# Patient Record
Sex: Male | Born: 1935 | Race: White | Hispanic: No | Marital: Single | Smoking: Never smoker
Health system: Southern US, Community
[De-identification: ages and names within clinical notes are randomized; demographics above are authoritative.]

## PROBLEM LIST (undated history)

## (undated) DIAGNOSIS — I639 Cerebral infarction, unspecified: Secondary | ICD-10-CM

## (undated) DIAGNOSIS — F0280 Dementia in other diseases classified elsewhere without behavioral disturbance: Secondary | ICD-10-CM

## (undated) DIAGNOSIS — I10 Essential (primary) hypertension: Secondary | ICD-10-CM

## (undated) DIAGNOSIS — G309 Alzheimer's disease, unspecified: Secondary | ICD-10-CM

## (undated) DIAGNOSIS — G459 Transient cerebral ischemic attack, unspecified: Secondary | ICD-10-CM

## (undated) DIAGNOSIS — R339 Retention of urine, unspecified: Secondary | ICD-10-CM

---

## 2013-09-02 ENCOUNTER — Emergency Department (HOSPITAL_COMMUNITY)
Admission: EM | Admit: 2013-09-02 | Discharge: 2013-09-05 | Disposition: A | Discharge status: Skilled Nursing Facility | Payer: Medicare Other | Attending: Emergency Medicine | Admitting: Emergency Medicine

## 2013-09-02 ENCOUNTER — Emergency Department (HOSPITAL_COMMUNITY): Payer: Medicare Other

## 2013-09-02 ENCOUNTER — Encounter (HOSPITAL_COMMUNITY): Payer: Self-pay | Admitting: Emergency Medicine

## 2013-09-02 DIAGNOSIS — F0391 Unspecified dementia with behavioral disturbance: Secondary | ICD-10-CM | POA: Insufficient documentation

## 2013-09-02 DIAGNOSIS — F0390 Unspecified dementia without behavioral disturbance: Secondary | ICD-10-CM

## 2013-09-02 DIAGNOSIS — F03918 Unspecified dementia, unspecified severity, with other behavioral disturbance: Secondary | ICD-10-CM | POA: Insufficient documentation

## 2013-09-02 HISTORY — DX: Cerebral infarction, unspecified: I63.9

## 2013-09-02 HISTORY — DX: Essential (primary) hypertension: I10

## 2013-09-02 LAB — CBC WITH DIFFERENTIAL/PLATELET
Basophils Absolute: 0.1 10*3/uL (ref 0.0–0.1)
Basophils Relative: 1 % (ref 0–1)
Eosinophils Absolute: 0 10*3/uL (ref 0.0–0.7)
HCT: 43.2 % (ref 39.0–52.0)
Lymphocytes Relative: 17 % (ref 12–46)
MCV: 89.1 fL (ref 78.0–100.0)
Monocytes Absolute: 0.7 10*3/uL (ref 0.1–1.0)
Monocytes Relative: 7 % (ref 3–12)
Neutro Abs: 7.6 10*3/uL (ref 1.7–7.7)
Neutrophils Relative %: 76 % (ref 43–77)
RDW: 12.8 % (ref 11.5–15.5)
WBC: 10 10*3/uL (ref 4.0–10.5)

## 2013-09-02 LAB — POCT I-STAT, CHEM 8
BUN: 25 mg/dL — ABNORMAL HIGH (ref 6–23)
Calcium, Ion: 1.19 mmol/L (ref 1.13–1.30)
Chloride: 107 mEq/L (ref 96–112)
Creatinine, Ser: 1.6 mg/dL — ABNORMAL HIGH (ref 0.50–1.35)
Glucose, Bld: 123 mg/dL — ABNORMAL HIGH (ref 70–99)
Potassium: 3.8 mEq/L (ref 3.5–5.1)
TCO2: 20 mmol/L (ref 0–100)

## 2013-09-02 LAB — URINALYSIS, ROUTINE W REFLEX MICROSCOPIC
Ketones, ur: NEGATIVE mg/dL
Leukocytes, UA: NEGATIVE
Nitrite: NEGATIVE
Protein, ur: NEGATIVE mg/dL
Specific Gravity, Urine: 1.032 — ABNORMAL HIGH (ref 1.005–1.030)
Urobilinogen, UA: 1 mg/dL (ref 0.0–1.0)
pH: 5 (ref 5.0–8.0)

## 2013-09-02 LAB — RAPID URINE DRUG SCREEN, HOSP PERFORMED
Benzodiazepines: NOT DETECTED
Cocaine: NOT DETECTED
Opiates: NOT DETECTED

## 2013-09-02 LAB — ETHANOL: Alcohol, Ethyl (B): <11 mg/dL (ref 0–11)

## 2013-09-02 MED ORDER — HALOPERIDOL LACTATE 5 MG/ML IJ SOLN
5.0000 mg | Freq: Once | INTRAMUSCULAR | Status: AC
  Administered 2013-09-02: 5 mg via INTRAMUSCULAR
  Filled 2013-09-02: qty 1

## 2013-09-02 MED ORDER — HALOPERIDOL 1 MG PO TABS
0.5000 mg | ORAL_TABLET | Freq: Once | ORAL | Status: DC
  Filled 2013-09-02: qty 1
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
## 2013-09-02 NOTE — ED Notes (Signed)
[facility] called and made aware that patient will be returning to them.

## 2013-09-02 NOTE — ED Notes (Signed)
[facility] called for transport.  
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
## 2013-09-02 NOTE — ED Notes (Signed)
PT TRANSPORTED TO CT 

## 2013-09-03 DIAGNOSIS — F0390 Unspecified dementia without behavioral disturbance: Secondary | ICD-10-CM

## 2013-09-03 MED ORDER — SODIUM CHLORIDE 0.9 % IV BOLUS (SEPSIS)
1000.0000 mL | Freq: Once | INTRAVENOUS | Status: AC
  Administered 2013-09-03: 1000 mL via INTRAVENOUS

## 2013-09-03 MED ORDER — LISINOPRIL 5 MG PO TABS
5.0000 mg | ORAL_TABLET | Freq: Every day | ORAL | Status: DC
  Administered 2013-09-03 – 2013-09-05 (×3): 5 mg via ORAL
  Filled 2013-09-03 (×3): qty 1

## 2013-09-03 MED ORDER — ASPIRIN 81 MG PO CHEW
81.0000 mg | CHEWABLE_TABLET | Freq: Every day | ORAL | Status: DC
  Administered 2013-09-03 – 2013-09-05 (×3): 81 mg via ORAL
  Filled 2013-09-03 (×2): qty 1

## 2013-09-03 MED ORDER — HALOPERIDOL 1 MG PO TABS
0.5000 mg | ORAL_TABLET | ORAL | Status: DC | PRN
  Administered 2013-09-04: 0.5 mg via ORAL
  Filled 2013-09-03: qty 1
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
## 2013-09-03 NOTE — ED Notes (Signed)
Marcelino Duster from [facility] called to see if pt is IVC, told her EPIC chart says voluntary but transferred her to main ed to speak with his RN to confirm.

## 2013-09-03 NOTE — Progress Notes (Signed)
CSW spoke with Engineer, manufacturing at [facility]. Per difscussion with Crystal, patient has been agitated, throwing food and refusing to take medications. Pt was agitated upon arrival to the ED, however after im haldol administerred patient is calm and cooperative. Pt has history of dementia. Pt recently moved into [place] 3 days ago. Patient was prescribed haldol at the facility, however this medication is PO. [facility] will accept patient back, as long as medication is prescribed, such as Ativan Gel to help ease pt agitation when patient is refusing medications. CSW informed psychiatrist and EDP.   Marland KitchenCatha Gosselin, [place] [phone]  ED CSW .09/03/2013 1405pm
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
## 2013-09-03 NOTE — Progress Notes (Signed)
Patient ID: Jacob York, male   DOB: 14-Jun-1936, 77 y.o.   MRN: [id number] Attempted to evaluate patient this am with Dr Tawni Carnes but was not able to.  Patient could not participate and remain engaged in the conversation.  Patient answered "I don't know" to all questions asked.  He is oriented to self only.  He denies wanting to hurt self or anybody.  He says he love everybody he is living with but then stated he lived with his brothers.  Patient is currently living in an assisted living facility  Patient denied AVH and he did not exhibit any signs of paranoia. Plan: We will discharge patient back to his assisted living facility We will advise staff at the ALF to contact their visiting Psychiatrist who will manage his anger out burst Dahlia Byes  PMHNP-BC

## 2013-09-03 NOTE — Progress Notes (Addendum)
Underwriter initiated bed placement for gero-psych placement for pt.  The following facilities were contacted for availability: 1)[facility]-declined due to aggression and dementia 2)[facility]- faxed referral 3)[facility]-no beds 4)[facility]-no beds per [facility] answer 6)[facility]-out of encatchment area 7)[facility]-full 8)[facility]- faxed referral 9)[facility]-declined due to aggression and dementia 10)[facility]-faxed referral 11)[facility]-declined due to aggression 12)[facility]-full 13)[facility]-faxed referral 14)[facility]-faxed referral; Jake Shark @ [facility] gave ZOXW#[id number] good until 09/09/13  Blain Pais, MHT/NS

## 2013-09-03 NOTE — Progress Notes (Signed)
CSW attempted to seek geri-psych placement for pt. [facility] is at capacity. [facility] is at capacity.   Sent referral to [facility].  Pt is pending review.  Marva Panda, Theresia Majors  [phone]  .09/03/2013
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
## 2013-09-03 NOTE — ED Provider Notes (Signed)
Medical screening examination/treatment/procedure(s) were conducted as a shared visit with non-physician practitioner(s) and myself.  I personally evaluated the patient during the encounter.  EKG Interpretation    Date/Time:  Thursday September 02 2013 19:30:57 EST Ventricular Rate:  100 PR Interval:  213 QRS Duration: 79 QT Interval:  341 QTC Calculation: 440 R Axis:   1 Text Interpretation:  Sinus tachycardia Borderline prolonged PR interval Confirmed by Gwendolyn Grant  MD, Lexton Hidalgo ([id number]) on 09/02/2013 7:40:47 PM            69M sent from nursing home for aggressive behavior. Here relaxing comfortably, disoriented. Well appearing, in NAD. Labs and imaging normal. Upon taking patient back to nursing home, got extremely agitated. Haldol given for agitation. Patient had TTS consult to help with possible new medications for agitation.   Dagmar Hait, MD 09/03/13 [phone]

## 2013-09-03 NOTE — ED Provider Notes (Signed)
Jacob York S 12:00 AM patient discussed and signed. Patient awaiting psychiatric consult for medication recommendations for his agitated condition.  Patient has been resting well without any complications or events through the night. Psychiatric consultation still pending.  Angus Seller, PA-C 09/03/13 [phone]

## 2013-09-03 NOTE — ED Provider Notes (Signed)
Behavior health has attempted to send the patient back to this facility without success. Patient's elevated creatinine noted he will be given a liter of fluid and a repeat bmet has been ordered for tomorrow. Per psychiatry recommendation, patient will require inpatient hospitalization and will be started on psychiatric medications protime and his home medications have been reordered by me  Toy Baker, MD 09/03/13 1419

## 2013-09-03 NOTE — ED Notes (Signed)
Waiting on social work prior to discharge

## 2013-09-03 NOTE — BHH Counselor (Signed)
Writer called Conservator, museum/gallery at [facility] [phone]. Jacob York states they will take pt back if his meds are changed and he is no longer aggressive. TTS will contact Tori after psychiatrist or [facility] midlevel evaluate him.  Evette Cristal, [place] Assessment Counselor

## 2013-09-03 NOTE — Progress Notes (Signed)
CSW referred patient to [facility], however at capacity. Oncoming staff and tts to seek geriatric psych placement.   Catha Gosselin, LCSW [phone]  ED CSW .09/03/2013 1423pm

## 2013-09-03 NOTE — ED Provider Notes (Signed)
Medical screening examination/treatment/procedure(s) were performed by non-physician practitioner and as supervising physician I was immediately available for consultation/collaboration.  EKG Interpretation    Date/Time:  Thursday September 02 2013 19:30:57 EST Ventricular Rate:  100 PR Interval:  213 QRS Duration: 79 QT Interval:  341 QTC Calculation: 440 R Axis:   1 Text Interpretation:  Sinus tachycardia Borderline prolonged PR interval Confirmed by Gwendolyn Grant  MD, BLAIR ([id number]) on 09/02/2013 7:40:47 PM              Randa Spike Mort Sawyers, MD 09/03/13 [phone]

## 2013-09-03 NOTE — Progress Notes (Signed)
Pt was interviewed with NP. Chart reviewed. Agree with above assessment and plan. Pt is currently calm and cooperative. He denies SI/HI/AVH/paranoia. He started living in a new ALF 3 days ago. Since pt is currently calm, we will discharge pt back to ALF. He will need to f/u with the ALF's consulting psychiatrist for med recommendations for any future agitation.

## 2013-09-03 NOTE — Progress Notes (Signed)
   CARE MANAGEMENT ED NOTE 09/03/2013  Patient:  TYRION, GLAUDE   Account Number:  [id number]  Date Initiated:  09/03/2013  Documentation initiated by:  Edd Arbour  Subjective/Objective Assessment:   77 yr old male aarp medicare complete pt Pt initially unable to remember his pcp name or location of male MD office After noted word finding pt states pcp is in [place], [place] Dr Lisbeth Ply     Subjective/Objective Assessment Detail:     Action/Plan:   Cm spoke with pt updated EPIC   Action/Plan Detail:   Anticipated DC Date:       Status Recommendation to Physician:   Result of Recommendation:    Other ED Services  Consult Working Plan    DC Planning Services  Other  PCP issues  Outpatient Services - Pt will follow up    Choice offered to / List presented to:            Status of service:  Completed, signed off  ED Comments:   ED Comments Detail:

## 2013-09-03 NOTE — Progress Notes (Signed)
Gunnar Fusi, from [facility], return call stated pt was declined from due to dementia.  [facility] does not treat dementia at this time.   Blain Pais, MHT/NS

## 2013-09-03 NOTE — ED Notes (Signed)
Psychiatry at bedside.

## 2013-09-03 NOTE — Progress Notes (Signed)
Per chart review, patient from [facility]. Per chart review, patient awaiting psych evaluation by providers to determine medication recommendation and disposition. CSW awaiting further evaluation.   Catha Gosselin, LCSW [phone]  ED CSW .09/03/2013 807am

## 2013-09-04 DIAGNOSIS — F0391 Unspecified dementia with behavioral disturbance: Secondary | ICD-10-CM

## 2013-09-04 DIAGNOSIS — F03918 Unspecified dementia, unspecified severity, with other behavioral disturbance: Secondary | ICD-10-CM

## 2013-09-04 LAB — BASIC METABOLIC PANEL
BUN: 18 mg/dL (ref 6–23)
BUN: 18 mg/dL (ref 6–23)
CO2: 22 mEq/L (ref 19–32)
CO2: 24 mEq/L (ref 19–32)
Chloride: 101 mEq/L (ref 96–112)
Chloride: 100 mEq/L (ref 96–112)
Creatinine, Ser: 1.36 mg/dL — ABNORMAL HIGH (ref 0.50–1.35)
GFR calc Af Amer: 59 mL/min — ABNORMAL LOW (ref >90)
GFR calc Af Amer: 57 mL/min — ABNORMAL LOW (ref >90)
GFR calc non Af Amer: 51 mL/min — ABNORMAL LOW (ref >90)
Glucose, Bld: 106 mg/dL — ABNORMAL HIGH (ref 70–99)
Glucose, Bld: 125 mg/dL — ABNORMAL HIGH (ref 70–99)
Potassium: 3.5 mEq/L (ref 3.5–5.1)
Potassium: 4.4 mEq/L (ref 3.5–5.1)
Sodium: 137 mEq/L (ref 135–145)

## 2013-09-04 MED ORDER — DIPHENHYDRAMINE HCL 25 MG PO CAPS
50.0000 mg | ORAL_CAPSULE | Freq: Once | ORAL | Status: AC
  Administered 2013-09-04: 50 mg via ORAL
  Filled 2013-09-04: qty 2

## 2013-09-04 NOTE — BHH Counselor (Signed)
Writer spoke with Beryle Quant. @ [facility], unsure if psychiatrist will be taking any outside referrals today and this pt has not been reviewed by staff.    Follow up with Deanne @ [facility], will be reviewed with psychiatrist today for disposition.

## 2013-09-04 NOTE — BHH Counselor (Signed)
Writer contacted [facility].  [facility] confirmed pt is pending medical review for waitlist placement   [place] Anthonette Legato, MSW, LCSW, [facility] Triage Specialist

## 2013-09-04 NOTE — ED Notes (Signed)
PT BELONGINGS IN  [employer] #[id number]

## 2013-09-04 NOTE — ED Notes (Signed)
Pt.'s personal belongings in a bag ; 1pair of black leather shoes, 1 black pants with 1 black belt ,1red checkered boxer's , 1 red checkered polo shirt and 1 black wallet  With calling cards, NO CASH. Kept in [place] [address]27.

## 2013-09-04 NOTE — Progress Notes (Addendum)
Received phone call from Kemp at [facility], stated that pt has been reviewed by their medical team and have requested a repeat BUN and creatinine; in addition there was some question regarding pt's hx of stroke/HTN and would like to know why pt currently is not on any medication for these medical issues.  TTS at [facility] was made aware of phone call and pt's RN at [facility] was also made aware of requests, RN stated she would talk to the MD and call back to [phone].  Update: Received call back from pt's RN Donell Beers, stated she spoke with Dr. Freida Busman and he has ordered a BMET in regards to the request for BUN and creatinine, she also stated that he is on Lisinopril for his BP, which have been controlled while in the ED and is on Aspirin as an antiplatelet.  Will call [facility] with update.  1839 spoke with Okey Regal and updated her with information on orders and med hx, asked that once updated labs are complete that the results along with documentation of the Lisinopril and Aspirin please be faxed as well.  Tomi Bamberger, MHT

## 2013-09-04 NOTE — ED Notes (Signed)
Pt wandering out of room and asking about how he got to the hospital. Pt re-oriented to room.

## 2013-09-04 NOTE — BH Assessment (Signed)
No beds at [facility], per Tammi.  Dossie Arbour, MA  Disposition MHT
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
## 2013-09-04 NOTE — ED Notes (Signed)
Pt is calm, cooperative, alert, oriented, plan of care explained. Pending for placement currently

## 2013-09-05 ENCOUNTER — Encounter (HOSPITAL_COMMUNITY): Payer: Self-pay | Admitting: Registered Nurse

## 2013-09-05 NOTE — Progress Notes (Signed)
Underwriter faxed new BUN and creatinine levels to [facility] in addition to [facility] from [facility].  Confirmed with Christianne Borrow at [facility] fax received and will be reviewed by medical on Monday for wait list.  Blain Pais, MHT/NS

## 2013-09-05 NOTE — ED Notes (Signed)
Pt vital signs are not to be taken at this time per this writer,  Pt is sleeping and he awakens and starts wandering halls every time his vitals are taken,  Pt has been stable and vitals are within normal limits,

## 2013-09-05 NOTE — Progress Notes (Addendum)
Per Rosey Bath at [facility] [phone]), pt ready to be received. Pt cleared by psych and CSW and ready for d/c. RN aware.  York Spaniel Angelica, [phone]     ED CSW

## 2013-09-05 NOTE — ED Notes (Signed)
[facility] called for transport.  

## 2013-09-05 NOTE — Progress Notes (Signed)
[facility] for [facility] as requested by psych MD, spoke with Delorise Shiner, currently have no beds at this time but stated that they will definitely have beds available tomorrow and to fax paper work so that it could be reviewed in the am by staff.  Paper work faxed.  Tomi Bamberger, MHT

## 2013-09-05 NOTE — ED Notes (Signed)
Attempted to call report to [facility] assisted living, no answer.

## 2013-09-05 NOTE — Progress Notes (Signed)
Patient ID: Jacob York, male   DOB: 1936/03/24, 77 y.o.   MRN: [id number]  Pt was interviewed with NP Shuvon. Chart reviewed. Pt has been calm and cooperative in the ED. Mild wandering at night was noted in ED, but no aggression. Pt currently pleasant, eating breakfast. Sleep/appetite good. Pt denies SI/HI/AVH. He states his children live in [place].  Psychiatric Specialty Exam: Physical Exam  ROS  Blood pressure 155/87, pulse 89, temperature 98 F (36.7 C), temperature source Oral, resp. rate 20, SpO2 97.00%.There is no height or weight on file to calculate BMI.  General Appearance: Fairly Groomed  Patent attorney::  Fair  Speech:  Normal Rate  Volume:  Normal  Mood:  Euthymic  Affect:  Appropriate, Congruent and Full Range  Thought Process:  Coherent and Goal Directed  Orientation:  Other:  oriented to person. stated the date was Dec 1st, 1980. He knows the city is [place].   Thought Content:  Hallucinations: None  Suicidal Thoughts:  No  Homicidal Thoughts:  No  Memory:  Immediate;   Poor Recent;   Poor Remote;   Poor  Judgement:  Fair  Insight:  Lacking  Psychomotor Activity:  Normal  Concentration:  Fair  Recall:  Poor  Akathisia:  Negative  Handed:  Right  AIMS (if indicated):     Assets:  Communication Skills Housing Physical Health Social Support Transportation  Sleep:      Diagnosis: unchanged.  Plan: Pt has been calm and cooperative in ED, and has not needed PRN medication for agitation. Pt will be discharged back to ALF today. Pt should follow up with ALF primary care physician or consulting psychiatrist, if ALF feels that pt may need PRN medication for agitation in the future.  Ancil Linsey, MD

## 2013-09-05 NOTE — ED Notes (Signed)
Marcelino Duster called from [facility], said pt under review to be received at [facility]

## 2013-09-05 NOTE — ED Notes (Signed)
Pt up wandering around,  Talking about [place].  Pt redirected back to bed ,  Placed warm blankets on him for comfort.,  Side rails up ,  Bed low position,  Brakes applied

## 2014-05-24 ENCOUNTER — Encounter (INDEPENDENT_AMBULATORY_CARE_PROVIDER_SITE_OTHER): Payer: Self-pay | Admitting: Surgery

## 2014-05-24 ENCOUNTER — Ambulatory Visit (INDEPENDENT_AMBULATORY_CARE_PROVIDER_SITE_OTHER): Payer: Medicare Other | Admitting: Surgery

## 2014-05-24 VITALS — BP 122/80 | HR 77 | Temp 97.5°F | Ht 65.0 in | Wt 176.0 lb

## 2014-05-24 DIAGNOSIS — F0391 Unspecified dementia with behavioral disturbance: Secondary | ICD-10-CM | POA: Insufficient documentation

## 2014-05-24 DIAGNOSIS — F03918 Unspecified dementia, unspecified severity, with other behavioral disturbance: Secondary | ICD-10-CM | POA: Insufficient documentation

## 2014-05-24 DIAGNOSIS — I10 Essential (primary) hypertension: Secondary | ICD-10-CM | POA: Insufficient documentation

## 2014-05-24 DIAGNOSIS — F0390 Unspecified dementia without behavioral disturbance: Secondary | ICD-10-CM

## 2014-05-24 DIAGNOSIS — K4020 Bilateral inguinal hernia, without obstruction or gangrene, not specified as recurrent: Secondary | ICD-10-CM

## 2014-05-24 DIAGNOSIS — M6200 Separation of muscle (nontraumatic), unspecified site: Secondary | ICD-10-CM

## 2014-05-24 DIAGNOSIS — K436 Other and unspecified ventral hernia with obstruction, without gangrene: Secondary | ICD-10-CM

## 2014-05-24 DIAGNOSIS — M6208 Separation of muscle (nontraumatic), other site: Secondary | ICD-10-CM

## 2014-05-24 DIAGNOSIS — R413 Other amnesia: Secondary | ICD-10-CM | POA: Insufficient documentation

## 2014-05-24 NOTE — Progress Notes (Addendum)
Subjective:     Patient ID: Jacob York, male   DOB: April 09, 1936, 78 y.o.   MRN: [id number]  HPI  Note: Portions of this report may have been transcribed using voice recognition software. Every effort was made to ensure accuracy; however, inadvertent computerized transcription errors may be present.   Any transcriptional errors that result from this process are unintentional.            Jacob York  1936-03-16 [id number]  Patient Care Team: Melida Gimenez, MD as PCP - General (Family Medicine) Caprice Kluver, MD as Consulting Physician (Psychiatry)  This patient is a 78 y.o.male who presents today for surgical evaluation at the request of Francee Gentile, NP for [facility].   Reason for visit: Large left inguinal hernia.  Consider surgical repair  Pleasant elderly gentleman with moderate dementia.  Comes today with his son.  He has had a left groin hernia for some time.  Lives at [facility].  The lump has gotten larger and some discomfort.  Noticed by rounding nurse practitioner.  Ultrasound confirms bowel in inguinal hernia.  Surgical consultation requested.  Patient claims to have bowel movement every day without difficulty of urination but does not have great memory.  No prior abdominal surgeries.  Moderately active.  No history of cardiac issues.  History of stroke and distant past but not recently.  Takes baby aspirin but no other blood thinners.  Because of increasing size and discomfort with bowel present, son and covering the medical team wished surgery to be considered.  There are no active problems to display for this patient.   Past Medical History  Diagnosis Date  . Hypertension   . Stroke   . Dementia     History reviewed. No pertinent past surgical history.  History   Social History  . Marital Status: Single    Spouse Name: N/A    Number of Children: N/A  . Years of Education: N/A   Occupational History  .  Not on file.   Social History Main Topics  . Smoking status: Never Smoker   . Smokeless tobacco: Not on file  . Alcohol Use: No  . Drug Use: No  . Sexual Activity: Not on file   Other Topics Concern  . Not on file   Social History Narrative  . No narrative on file    History reviewed. No pertinent family history.  Current Outpatient Prescriptions  Medication Sig Dispense Refill  . alum & mag hydroxide-simeth (MAALOX/MYLANTA) 200-200-20 MG/5ML suspension Take by mouth every 6 (six) hours as needed for indigestion or heartburn.      Marland Kitchen aspirin 81 MG chewable tablet Chew 81 mg by mouth daily.      . cholecalciferol (VITAMIN D) 400 UNITS TABS tablet Take 400 Units by mouth.      . donepezil (ARICEPT) 10 MG tablet Take 10 mg by mouth at bedtime.      Marland Kitchen guaifenesin (ROBITUSSIN) 100 MG/5ML syrup Take 200 mg by mouth 3 (three) times daily as needed for cough.      . haloperidol (HALDOL) 0.5 MG tablet Take 0.5 mg by mouth every 4 (four) hours as needed for agitation.      Marland Kitchen lisinopril (PRINIVIL,ZESTRIL) 5 MG tablet Take 5 mg by mouth daily.      Marland Kitchen loperamide (IMODIUM) 2 MG capsule Take by mouth as needed for diarrhea or loose stools.      . magnesium hydroxide (MILK OF  MAGNESIA) 400 MG/5ML suspension Take by mouth daily as needed for mild constipation.      . metoprolol succinate (TOPROL-XL) 25 MG 24 hr tablet Take 25 mg by mouth daily.      . mirtazapine (REMERON) 30 MG tablet Take 30 mg by mouth at bedtime.      Marland Kitchen. neomycin-bacitracin-polymyxin (NEOSPORIN) [phone] ointment Apply topically 4 (four) times daily.       No current facility-administered medications for this visit.     No Known Allergies  BP 122/80  Pulse 77  Temp(Src) 97.5 F (36.4 C)  Ht 5\' 5"  (1.651 m)  Wt 176 lb (79.833 kg)  BMI 29.29 kg/m2  No results found.   Review of Systems  Constitutional: Negative for fever, chills and diaphoresis.  HENT: Negative for ear discharge, facial swelling, mouth sores,  nosebleeds, sore throat and trouble swallowing.   Eyes: Negative for photophobia, discharge and visual disturbance.  Respiratory: Negative for choking, chest tightness, shortness of breath and stridor.   Cardiovascular: Negative for chest pain and palpitations.  Gastrointestinal: Negative for nausea, vomiting, abdominal pain, diarrhea, constipation, blood in stool, abdominal distention, anal bleeding and rectal pain.  Endocrine: Negative for cold intolerance and heat intolerance.  Genitourinary: Negative for dysuria, urgency, difficulty urinating and testicular pain.  Musculoskeletal: Negative for arthralgias, back pain, gait problem and myalgias.  Skin: Negative for color change, pallor, rash and wound.  Allergic/Immunologic: Negative for environmental allergies and food allergies.  Neurological: Negative for dizziness, tremors, facial asymmetry, speech difficulty, weakness, numbness and headaches.  Hematological: Negative for adenopathy. Does not bruise/bleed easily.  Psychiatric/Behavioral: Positive for confusion and decreased concentration. Negative for hallucinations, self-injury, dysphoric mood and agitation. The patient is not nervous/anxious and is not hyperactive.        Objective:   Physical Exam  Constitutional: He is oriented to person, place, and time. He appears well-developed and well-nourished. No distress.  HENT:  Head: Normocephalic.  Mouth/Throat: Oropharynx is clear and moist. No oropharyngeal exudate.  Eyes: Conjunctivae and EOM are normal. Pupils are equal, round, and reactive to light. No scleral icterus.  Neck: Normal range of motion. Neck supple. No tracheal deviation present.  Cardiovascular: Normal rate, regular rhythm and intact distal pulses.   Pulmonary/Chest: Effort normal and breath sounds normal. No respiratory distress.  Abdominal: Soft. He exhibits no distension. There is no tenderness. A hernia is present. Hernia confirmed positive in the ventral area,  confirmed positive in the right inguinal area and confirmed positive in the left inguinal area.    Genitourinary:     Musculoskeletal: Normal range of motion. He exhibits no tenderness.  Lymphadenopathy:    He has no cervical adenopathy.       Right: No inguinal adenopathy present.       Left: No inguinal adenopathy present.  Neurological: He is alert and oriented to person, place, and time. No cranial nerve deficit. He exhibits normal muscle tone. Coordination normal.  Skin: Skin is warm and dry. No rash noted. He is not diaphoretic. No erythema. No pallor.  Psychiatric: He has a normal mood and affect. His speech is normal. His affect is not angry. He is not agitated, not aggressive, not slowed and not withdrawn. Thought content is not paranoid. Cognition and memory are impaired. He does not exhibit a depressed mood. He expresses no homicidal ideation. He exhibits abnormal recent memory and abnormal remote memory. He is inattentive.       Assessment:     Pleasant male with left  scrotal and small right inguinal hernias.  Incarcerated supraumbilical hernia in the setting of diastasis recti.  Mild/moderate dementia but stable.     Plan:     Standard of care is repair of hernias, especially the inguinal ones.  Increased risk of seroma/hematoma but still do-able.  He has risks of memory loss and dementia with general anesthesia worsening / possible, but his risk of incarceration/strangulation with bowel already in the inguinal hernia I think is a higher risk.    The patient and his son agreed.  They are interested in proceeding with hernia repair despite the neuropsych risks.  Plan laparoscopic approach.  Hopefully the supraumbilical hernia small enough that the stitches are necessary, but mesh may be needed there as well.  Usually outpatient with patient coming to home.  May need overnight stay - will follow his recovery and postoperative state.  The son is going to check and see if the  memory loss assisted living institution can handle postoperative hernia surgery:  The anatomy & physiology of the abdominal wall and pelvic floor was discussed.  The pathophysiology of hernias in the inguinal and pelvic region was discussed.  Natural history risks such as progressive enlargement, pain, incarceration & strangulation was discussed.   Contributors to complications such as smoking, obesity, diabetes, prior surgery, etc were discussed.    I feel the risks of no intervention will lead to serious problems that outweigh the operative risks; therefore, I recommended surgery to reduce and repair the hernia.  I explained laparoscopic techniques with possible need for an open approach.  I noted usual use of mesh to patch and/or buttress hernia repair  Risks such as bleeding, infection, abscess, need for further treatment, heart attack, death, and other risks were discussed.  I noted a good likelihood this will help address the problem.   Goals of post-operative recovery were discussed as well.  Possibility that this will not correct all symptoms was explained.  I stressed the importance of low-impact activity, aggressive pain control, avoiding constipation, & not pushing through pain to minimize risk of post-operative chronic pain or injury. Possibility of reherniation was discussed.  We will work to minimize complications.     An educational handout further explaining the pathology & treatment options was given as well.  Questions were answered.  The patient expresses understanding & wishes to proceed with surgery.

## 2014-05-24 NOTE — Patient Instructions (Signed)
Please consider the recommendations that we have given you today:  Consider surgery to reduce and repair the hernias in both groins and above the belly button.  See the Handout(s) we have given you.  Please call our office at [phone] if you wish to schedule surgery or if you have further questions / concerns.   Hernia A hernia occurs when an internal organ pushes out through a weak spot in the abdominal wall. Hernias most commonly occur in the groin and around the navel. Hernias often can be pushed back into place (reduced). Most hernias tend to get worse over time. Some abdominal hernias can get stuck in the opening (irreducible or incarcerated hernia) and cannot be reduced. An irreducible abdominal hernia which is tightly squeezed into the opening is at risk for impaired blood supply (strangulated hernia). A strangulated hernia is a medical emergency. Because of the risk for an irreducible or strangulated hernia, surgery may be recommended to repair a hernia. CAUSES   Heavy lifting.  Prolonged coughing.  Straining to have a bowel movement.  A cut (incision) made during an abdominal surgery. HOME CARE INSTRUCTIONS   Bed rest is not required. You may continue your normal activities.  Avoid lifting more than 10 pounds (4.5 kg) or straining.  Cough gently. If you are a smoker it is best to stop. Even the best hernia repair can break down with the continual strain of coughing. Even if you do not have your hernia repaired, a cough will continue to aggravate the problem.  Do not wear anything tight over your hernia. Do not try to keep it in with an outside bandage or truss. These can damage abdominal contents if they are trapped within the hernia sac.  Eat a normal diet.  Avoid constipation. Straining over long periods of time will increase hernia size and encourage breakdown of repairs. If you cannot do this with diet alone, stool softeners may be used. SEEK IMMEDIATE MEDICAL CARE  IF:   You have a fever.  You develop increasing abdominal pain.  You feel nauseous or vomit.  Your hernia is stuck outside the abdomen, looks discolored, feels hard, or is tender.  You have any changes in your bowel habits or in the hernia that are unusual for you.  You have increased pain or swelling around the hernia.  You cannot push the hernia back in place by applying gentle pressure while lying down. MAKE SURE YOU:   Understand these instructions.  Will watch your condition.  Will get help right away if you are not doing well or get worse. Document Released: 09/23/2005 Document Revised: 12/16/2011 Document Reviewed: 05/12/2008 [facility] Patient Information 2015 [place], [place]. This information is not intended to replace advice given to you by your health care provider. Make sure you discuss any questions you have with your health care provider.  HERNIA REPAIR: POST OP INSTRUCTIONS  1. DIET: Follow a light bland diet the first 24 hours after arrival home, such as soup, liquids, crackers, etc.  Be sure to include lots of fluids daily.  Avoid fast food or heavy meals as your are more likely to get nauseated.  Eat a low fat the next few days after surgery. 2. Take your usually prescribed home medications unless otherwise directed. 3. PAIN CONTROL: a. Pain is best controlled by a usual combination of three different methods TOGETHER: i. Ice/Heat ii. Over the counter pain medication iii. Prescription pain medication b. Most patients will experience some swelling and bruising around the  hernia(s) such as the bellybutton, groins, or old incisions.  Ice packs or heating pads (30-60 minutes up to 6 times a day) will help. Use ice for the first few days to help decrease swelling and bruising, then switch to heat to help relax tight/sore spots and speed recovery.  Some people prefer to use ice alone, heat alone, alternating between ice & heat.  Experiment to what works for you.  Swelling  and bruising can take several weeks to resolve.   c. It is helpful to take an over-the-counter pain medication regularly for the first few weeks.  Choose one of the following that works best for you: i. Naproxen (Aleve, etc)  Two 220mg  tabs twice a day ii. Ibuprofen (Advil, etc) Three 200mg  tabs four times a day (every meal & bedtime) iii. Acetaminophen (Tylenol, etc) 325-650mg  four times a day (every meal & bedtime) d. A  prescription for pain medication should be given to you upon discharge.  Take your pain medication as prescribed.  i. If you are having problems/concerns with the prescription medicine (does not control pain, nausea, vomiting, rash, itching, etc), please call us [phone] to see if we need to switch you to a different pain medicine that will work better for you and/or control your side effect better. ii. If you need a refill on your pain medication, please contact your pharmacy.  They will contact our office to request authorization. Prescriptions will not be filled after 5 pm or on week-ends. 4. Avoid getting constipated.  Between the surgery and the pain medications, it is common to experience some constipation.  Increasing fluid intake and taking a fiber supplement (such as Metamucil, Citrucel, FiberCon, MiraLax, etc) 1-2 times a day regularly will usually help prevent this problem from occurring.  A mild laxative (prune juice, Milk of Magnesia, MiraLax, etc) should be taken according to package directions if there are no bowel movements after 48 hours.   5. Wash / shower every day.  You may shower over the dressings as they are waterproof.   6. Remove your waterproof bandages 5 days after surgery.  You may leave the incision open to air.  You may replace a dressing/Band-Aid to cover the incision for comfort if you wish.  Continue to shower over incision(s) after the dressing is off.    7. ACTIVITIES as tolerated:   a. You may resume regular (light) daily activities  beginning the next day-such as daily self-care, walking, climbing stairs-gradually increasing activities as tolerated.  If you can walk 30 minutes without difficulty, it is safe to try more intense activity such as jogging, treadmill, bicycling, low-impact aerobics, swimming, etc. b. Save the most intensive and strenuous activity for last such as sit-ups, heavy lifting, contact sports, etc  Refrain from any heavy lifting or straining until you are off narcotics for pain control.   c. DO NOT PUSH THROUGH PAIN.  Let pain be your guide: If it hurts to do something, don't do it.  Pain is your body warning you to avoid that activity for another week until the pain goes down. d. You may drive when you are no longer taking prescription pain medication, you can comfortably wear a seatbelt, and you can safely maneuver your car and apply brakes. e. Bonita Quin may have sexual intercourse when it is comfortable.  8. FOLLOW UP in our office a. Please call [facility] at [phone] to set up an appointment to see your surgeon in the office for a follow-up appointment approximately  2-3 weeks after your surgery. b. Make sure that you call for this appointment the day you arrive home to insure a convenient appointment time. 9.  IF YOU HAVE DISABILITY OR FAMILY LEAVE FORMS, BRING THEM TO THE OFFICE FOR PROCESSING.  DO NOT GIVE THEM TO YOUR DOCTOR.  WHEN TO CALL us [phone]: 1. Poor pain control 2. Reactions / problems with new medications (rash/itching, nausea, etc)  3. Fever over 101.5 F (38.5 C) 4. Inability to urinate 5. Nausea and/or vomiting 6. Worsening swelling or bruising 7. Continued bleeding from incision. 8. Increased pain, redness, or drainage from the incision   The clinic staff is available to answer your questions during regular business hours (8:30am-5pm).  Please don't hesitate to call and ask to speak to one of our nurses for clinical concerns.   If you have a medical emergency, go to the nearest  emergency room or call 911.  A surgeon from [facility] is always on call at the hospitals in [facility], [place] [address], [address], [place], [place]  [address] ?  [address] [address], [place], [place]   [address] MAIN: [phone] ? TOLL FREE: [phone] ? FAX: [phone] [email or web address]  GETTING TO GOOD BOWEL HEALTH. Irregular bowel habits such as constipation and diarrhea can lead to many problems over time.  Having one soft bowel movement a day is the most important way to prevent further problems.  The anorectal canal is designed to handle stretching and feces to safely manage our ability to get rid of solid waste (feces, poop, stool) out of our body.  BUT, hard constipated stools can act like ripping concrete bricks and diarrhea can be a burning fire to this very sensitive area of our body, causing inflamed hemorrhoids, anal fissures, increasing risk is perirectal abscesses, abdominal pain/bloating, an making irritable bowel worse.     The goal: ONE SOFT BOWEL MOVEMENT A DAY!  To have soft, regular bowel movements:    Drink at least 8 tall glasses of water a day.     Take plenty of fiber.  Fiber is the undigested part of plant food that passes into the colon, acting s "natures broom" to encourage bowel motility and movement.  Fiber can absorb and hold large amounts of water. This results in a larger, bulkier stool, which is soft and easier to pass. Work gradually over several weeks up to 6 servings a day of fiber (25g a day even more if needed) in the form of: o Vegetables -- Root (potatoes, carrots, turnips), leafy green (lettuce, salad greens, celery, spinach), or cooked high residue (cabbage, broccoli, etc) o Fruit -- Fresh (unpeeled skin & pulp), Dried (prunes, apricots, cherries, etc ),  or stewed ( applesauce)  o Whole grain breads, pasta, etc (whole wheat)  o Bran cereals    Bulking Agents -- This type of water-retaining  fiber generally is easily obtained each day by one of the following:  o Psyllium bran -- The psyllium plant is remarkable because its ground seeds can retain so much water. This product is available as Metamucil, Konsyl, Effersyllium, Per Diem Fiber, or the less expensive generic preparation in drug and health food stores. Although labeled a laxative, it really is not a laxative.  o Methylcellulose -- This is another fiber derived from wood which also retains water. It is available as Citrucel. o Polyethylene Glycol - and "artificial" fiber commonly called Miralax or Glycolax.  It is helpful for people with  gassy or bloated feelings with regular fiber o Flax Seed - a less gassy fiber than psyllium   No reading or other relaxing activity while on the toilet. If bowel movements take longer than 5 minutes, you are too constipated   AVOID CONSTIPATION.  High fiber and water intake usually takes care of this.  Sometimes a laxative is needed to stimulate more frequent bowel movements, but    Laxatives are not a good long-term solution as it can wear the colon out. o Osmotics (Milk of Magnesia, Fleets phosphosoda, Magnesium citrate, MiraLax, GoLytely) are safer than  o Stimulants (Senokot, Castor Oil, Dulcolax, Ex Lax)    o Do not take laxatives for more than 7days in a row.    IF SEVERELY CONSTIPATED, try a Bowel Retraining Program: o Do not use laxatives.  o Eat a diet high in roughage, such as bran cereals and leafy vegetables.  o Drink six (6) ounces of prune or apricot juice each morning.  o Eat two (2) large servings of stewed fruit each day.  o Take one (1) heaping tablespoon of a psyllium-based bulking agent twice a day. Use sugar-free sweetener when possible to avoid excessive calories.  o Eat a normal breakfast.  o Set aside 15 minutes after breakfast to sit on the toilet, but do not strain to have a bowel movement.  o If you do not have a bowel movement by the third day, use an enema and  repeat the above steps.    Controlling diarrhea o Switch to liquids and simpler foods for a few days to avoid stressing your intestines further. o Avoid dairy products (especially milk & ice cream) for a short time.  The intestines often can lose the ability to digest lactose when stressed. o Avoid foods that cause gassiness or bloating.  Typical foods include beans and other legumes, cabbage, broccoli, and dairy foods.  Every person has some sensitivity to other foods, so listen to our body and avoid those foods that trigger problems for you. o Adding fiber (Citrucel, Metamucil, psyllium, Miralax) gradually can help thicken stools by absorbing excess fluid and retrain the intestines to act more normally.  Slowly increase the dose over a few weeks.  Too much fiber too soon can backfire and cause cramping & bloating. o Probiotics (such as active yogurt, Align, etc) may help repopulate the intestines and colon with normal bacteria and calm down a sensitive digestive tract.  Most studies show it to be of mild help, though, and such products can be costly. o Medicines:   Bismuth subsalicylate (ex. Kayopectate, Pepto Bismol) every 30 minutes for up to 6 doses can help control diarrhea.  Avoid if pregnant.   Loperamide (Immodium) can slow down diarrhea.  Start with two tablets (4mg  total) first and then try one tablet every 6 hours.  Avoid if you are having fevers or severe pain.  If you are not better or start feeling worse, stop all medicines and call your doctor for advice o Call your doctor if you are getting worse or not better.  Sometimes further testing (cultures, endoscopy, X-ray studies, bloodwork, etc) may be needed to help diagnose and treat the cause of the diarrhea.  Dementia Dementia is a general term for problems with brain function. A person with dementia has memory loss and a hard time with at least one other brain function such as thinking, speaking, or problem solving. Dementia can affect  social functioning, how you do your job, your mood, or  your personality. The changes may be hidden for a long time. The earliest forms of this disease are usually not detected by family or friends. Dementia can be:  Irreversible.  Potentially reversible.  Partially reversible.  Progressive. This means it can get worse over time. CAUSES  Irreversible dementia causes may include:  Degeneration of brain cells (Alzheimer disease or Lewy body dementia).  Multiple small strokes (vascular dementia).  Infection (chronic meningitis or Creutzfeldt-Jakob disease).  Frontotemporal dementia. This affects younger people, age 68 to 16, compared to those who have Alzheimer disease.  Dementia associated with other disorders like Parkinson disease, Huntington disease, or HIV-associated dementia. Potentially or partially reversible dementia causes may include:  Medicines.  Metabolic causes such as excessive alcohol intake, vitamin B12 deficiency, or thyroid disease.  Masses or pressure in the brain such as a tumor, blood clot, or hydrocephalus. SIGNS AND SYMPTOMS  Symptoms are often hard to detect. Family members or coworkers may not notice them early in the disease process. Different people with dementia may have different symptoms. Symptoms can include:  A hard time with memory, especially recent memory. Long-term memory may not be impaired.  Asking the same question multiple times or forgetting something someone just said.  A hard time speaking your thoughts or finding certain words.  A hard time solving problems or performing familiar tasks (such as how to use a telephone).  Sudden changes in mood.  Changes in personality, especially increasing moodiness or mistrust.  Depression.  A hard time understanding complex ideas that were never a problem in the past. DIAGNOSIS  There are no specific tests for dementia.   Your health care provider may recommend a thorough evaluation. This is  because some forms of dementia can be reversible. The evaluation will likely include a physical exam and getting a detailed history from you and a family member. The history often gives the best clues and suggestions for a diagnosis.  Memory testing may be done. A detailed brain function evaluation called neuropsychologic testing may be helpful.  Lab tests and brain imaging (such as a CT scan or MRI scan) are sometimes important.  Sometimes observation and re-evaluation over time is very helpful. TREATMENT  Treatment depends on the cause.   If the problem is a vitamin deficiency, it may be helped or cured with supplements.  For dementias such as Alzheimer disease, medicines are available to stabilize or slow the course of the disease. There are no cures for this type of dementia.  Your health care provider can help direct you to groups, organizations, and other health care providers to help with decisions in the care of you or your loved one. HOME CARE INSTRUCTIONS The care of individuals with dementia is varied and dependent upon the progression of the dementia. The following suggestions are intended for the person living with, or caring for, the person with dementia.  Create a safe environment.  Remove the locks on bathroom doors to prevent the person from accidentally locking himself or herself in.  Use childproof latches on kitchen cabinets and any place where cleaning supplies, chemicals, or alcohol are kept.  Use childproof covers in unused electrical outlets.  Install childproof devices to keep doors and windows secured.  Remove stove knobs or install safety knobs and an automatic shut-off on the stove.  Lower the temperature on water heaters.  Label medicines and keep them locked up.  Secure knives, lighters, matches, power tools, and guns, and keep these items out of reach.  Keep  the house free from clutter. Remove rugs or anything that might contribute to a  fall.  Remove objects that might break and hurt the person.  Make sure lighting is good, both inside and outside.  Install grab rails as needed.  Use a monitoring device to alert you to falls or other needs for help.  Reduce confusion.  Keep familiar objects and people around.  Use night lights or dim lights at night.  Label items or areas.  Use reminders, notes, or directions for daily activities or tasks.  Keep a simple, consistent routine for waking, meals, bathing, dressing, and bedtime.  Create a calm, quiet environment.  Place large clocks and calendars prominently.  Display emergency numbers and home address near all telephones.  Use cues to establish different times of the day. An example is to open curtains to let the natural light in during the day.   Use effective communication.  Choose simple words and short sentences.  Use a gentle, calm tone of voice.  Be careful not to interrupt.  If the person is struggling to find a word or communicate a thought, try to provide the word or thought.  Ask one question at a time. Allow the person ample time to answer questions. Repeat the question again if the person does not respond.  Reduce nighttime restlessness.  Provide a comfortable bed.  Have a consistent nighttime routine.  Ensure a regular walking or physical activity schedule. Involve the person in daily activities as much as possible.  Limit napping during the day.  Limit caffeine.  Attend social events that stimulate rather than overwhelm the senses.  Encourage good nutrition and hydration.  Reduce distractions during meal times and snacks.  Avoid foods that are too hot or too cold.  Monitor chewing and swallowing ability.  Continue with routine vision, hearing, dental, and medical screenings.  Give medicines only as directed by the health care provider.  Monitor driving abilities. Do not allow the person to drive when safe driving is no  longer possible.  Register with an identification program which could provide location assistance in the event of a missing person situation. SEEK MEDICAL CARE IF:   New behavioral problems start such as moodiness, aggressiveness, or seeing things that are not there (hallucinations).  Any new problem with brain function happens. This includes problems with balance, speech, or falling a lot.  Problems with swallowing develop.  Any symptoms of other illness happen. Small changes or worsening in any aspect of brain function can be a sign that the illness is getting worse. It can also be a sign of another medical illness such as infection. Seeing a health care provider right away is important. SEEK IMMEDIATE MEDICAL CARE IF:   A fever develops.  New or worsened confusion develops.  New or worsened sleepiness develops.  Staying awake becomes hard to do. Document Released: 03/19/2001 Document Revised: 02/07/2014 Document Reviewed: 02/18/2011 [facility] Patient Information 2015 [place], [place]. This information is not intended to replace advice given to you by your health care provider. Make sure you discuss any questions you have with your health care provider.

## 2014-06-02 ENCOUNTER — Telehealth (INDEPENDENT_AMBULATORY_CARE_PROVIDER_SITE_OTHER): Payer: Self-pay

## 2014-06-02 NOTE — Telephone Encounter (Signed)
LMOM for pt's son to call me so I can give him the message below about obtaining POA for the pt. The pt's surgery is scheduled for 06/20/14 by Dr Michaell Cowing to have lap BIH and VWH repair.

## 2014-06-02 NOTE — Telephone Encounter (Signed)
Carla with preadmission's calling to notify us about the pt having severe dementia. The pt can't remember anything really after 5 minutes of talking to the pt. Angela Nevin called the facility to speak to someone that takes care of the pt and they confirmed how severe the dementia is with the pt. The son takes the pt to all of his appt's just like he did here when he met Dr Johney Maine to discuss surgery but the son is not POA for the pt. The hospital will not let the pt sign consent nor the son since he is not POA. They want me to call the pt's son to notify him that he needs to be working on getting the POA for surgery. I will call the son and notify Dr Johney Maine.

## 2014-06-03 NOTE — Telephone Encounter (Signed)
Pts son called back and understands attached information. He will try to get POA prior to surgery date. He states if he cannot he will call to move out appt date so that he can get POA in place.

## 2014-06-07 NOTE — Telephone Encounter (Signed)
Preoperative clinic concerned about need for someone to sign the consent.  Patient with dementia.  Son should be able to sign consent form but they insist on need for power of attorney.  Son is working on that.  As long as the patient's dementia stable, it is reasonable to proceed with surgery if they wish to remain aggressive and fix the hernias that are bothering the patient.  The son knows there is an increased risk of worsening dementia or postoperative complications.  There have been no episodes of major incarceration.  It is reasonable to delay surgery a few weeks until the consent is signed and power of attorney issues are settled.  Hopefully that can be arranged before the surgery date.  If not we will delay.

## 2014-06-09 ENCOUNTER — Encounter (HOSPITAL_COMMUNITY): Payer: Self-pay | Admitting: Pharmacy Technician

## 2014-06-16 ENCOUNTER — Encounter (HOSPITAL_COMMUNITY): Payer: Self-pay | Admitting: *Deleted

## 2014-06-16 NOTE — Progress Notes (Signed)
Faxed pre op instructions to [facility] will call tomorrow to make sure they received the information

## 2014-06-16 NOTE — Progress Notes (Addendum)
                 Your procedure is scheduled on: Monday,June 20, 2014   Report to [facility] at [phone]  AM.  Call this number if you have problems the morning of surgery: [phone]   Remember: Bring insurance card and picture ID, packet from [facility] with most current medication record, history and physical   Do not drink liquids or  eat food:After Midnight. Sunday night 06-19-2014      Take these medicines the morning of surgery with A SIP OF WATER: Toprolol, may take Haldol if needed for agitation                          SEE [place] PREPARING FOR SURGERY SHEET    Do not wear jewelry, make-up or nail polish.  Do not wear lotions, powders, or perfumes. You may wear deodorant.             Men may shave face and neck.  Do not bring valuables to the hospital.  Contacts, dentures or bridgework may not be worn into surgery.  Leave suitcase in the car. After surgery it may be brought to your room.  For patients admitted to the hospital, checkout time is 11:00 AM the day of                         discharge.   Patients discharged the day of surgery will not be allowed to drive home.  Name and phone number of your driver: Radene Ou daughter [phone]     Please read over the following fact sheets that you were given:MRSA Information                    Call Jolyn Nap, RN pre op nurse if needed [phone]               FAILURE TO FOLLOW THESE INSTRUCTIONS MAY RESULT IN THE CANCELLATION OF YOUR SURGERY. PATIENT SIGNATURE___________________________________________________

## 2014-06-17 NOTE — Progress Notes (Signed)
Re-faxed pre op instructions at 11:05 a.m. to [facility] to  Nurse Tammy discussed the pre op instructions by telephone there were no questions.   Tammy will call back if there is a question after receiving the faxed instructions.

## 2014-06-19 NOTE — Anesthesia Preprocedure Evaluation (Addendum)
Anesthesia Evaluation  Patient identified by MRN, date of birth, ID band Patient awake    Reviewed: Allergy & Precautions, H&P , NPO status , Patient's Chart, lab work & pertinent test results  History of Anesthesia Complications Negative for: history of anesthetic complications  Airway Mallampati: II TM Distance: >3 FB Neck ROM: Full    Dental no notable dental hx.    Pulmonary neg pulmonary ROS,  breath sounds clear to auscultation  Pulmonary exam normal       Cardiovascular hypertension, Pt. on medications and Pt. on home beta blockers - CAD and - Past MI Rhythm:Regular Rate:Normal     Neuro/Psych PSYCHIATRIC DISORDERS Anxiety Depression Moderate dementia  Neuromuscular disease CVA    GI/Hepatic negative GI ROS, Neg liver ROS,   Endo/Other  negative endocrine ROS  Renal/GU negative Renal ROS  negative genitourinary   Musculoskeletal negative musculoskeletal ROS (+)   Abdominal   Peds negative pediatric ROS (+)  Hematology negative hematology ROS (+)   Anesthesia Other Findings   Reproductive/Obstetrics negative OB ROS                          Anesthesia Physical Anesthesia Plan  ASA: III  Anesthesia Plan: General   Post-op Pain Management:    Induction: Intravenous  Airway Management Planned: Oral ETT  Additional Equipment:   Intra-op Plan:   Post-operative Plan: Extubation in OR  Informed Consent: I have reviewed the patients History and Physical, chart, labs and discussed the procedure including the risks, benefits and alternatives for the proposed anesthesia with the patient or authorized representative who has indicated his/her understanding and acceptance.   Dental advisory given  Plan Discussed with: CRNA  Anesthesia Plan Comments:        Anesthesia Quick Evaluation

## 2014-06-20 ENCOUNTER — Encounter (HOSPITAL_COMMUNITY): Payer: Medicare Other | Admitting: Anesthesiology

## 2014-06-20 ENCOUNTER — Encounter (HOSPITAL_COMMUNITY)
Admission: RE | Disposition: A | Discharge status: Home / Self Care | Payer: Self-pay | Source: Ambulatory Visit | Attending: Surgery

## 2014-06-20 ENCOUNTER — Encounter (HOSPITAL_COMMUNITY): Admission: RE | Payer: Self-pay | Source: Ambulatory Visit

## 2014-06-20 ENCOUNTER — Encounter (HOSPITAL_COMMUNITY): Payer: Self-pay | Admitting: Certified Registered Nurse Anesthetist

## 2014-06-20 ENCOUNTER — Ambulatory Visit (HOSPITAL_COMMUNITY): Admission: RE | Admit: 2014-06-20 | Payer: Medicare Other | Source: Ambulatory Visit | Admitting: Surgery

## 2014-06-20 ENCOUNTER — Observation Stay (HOSPITAL_COMMUNITY)
Admission: RE | Admit: 2014-06-20 | Discharge: 2014-06-21 | Disposition: A | Discharge status: Home / Self Care | Payer: Medicare Other | Source: Ambulatory Visit | Attending: Surgery | Admitting: Surgery

## 2014-06-20 ENCOUNTER — Ambulatory Visit (HOSPITAL_COMMUNITY): Payer: Medicare Other

## 2014-06-20 ENCOUNTER — Ambulatory Visit (HOSPITAL_COMMUNITY): Payer: Medicare Other | Admitting: Anesthesiology

## 2014-06-20 DIAGNOSIS — I10 Essential (primary) hypertension: Secondary | ICD-10-CM | POA: Insufficient documentation

## 2014-06-20 DIAGNOSIS — K4190 Unilateral femoral hernia, without obstruction or gangrene, not specified as recurrent: Secondary | ICD-10-CM | POA: Insufficient documentation

## 2014-06-20 DIAGNOSIS — Z23 Encounter for immunization: Secondary | ICD-10-CM | POA: Insufficient documentation

## 2014-06-20 DIAGNOSIS — F03918 Unspecified dementia, unspecified severity, with other behavioral disturbance: Secondary | ICD-10-CM | POA: Diagnosis present

## 2014-06-20 DIAGNOSIS — Z7982 Long term (current) use of aspirin: Secondary | ICD-10-CM | POA: Diagnosis not present

## 2014-06-20 DIAGNOSIS — K436 Other and unspecified ventral hernia with obstruction, without gangrene: Secondary | ICD-10-CM

## 2014-06-20 DIAGNOSIS — K439 Ventral hernia without obstruction or gangrene: Secondary | ICD-10-CM | POA: Diagnosis present

## 2014-06-20 DIAGNOSIS — Z8673 Personal history of transient ischemic attack (TIA), and cerebral infarction without residual deficits: Secondary | ICD-10-CM | POA: Insufficient documentation

## 2014-06-20 DIAGNOSIS — F0390 Unspecified dementia without behavioral disturbance: Secondary | ICD-10-CM | POA: Diagnosis not present

## 2014-06-20 DIAGNOSIS — K4020 Bilateral inguinal hernia, without obstruction or gangrene, not specified as recurrent: Principal | ICD-10-CM

## 2014-06-20 DIAGNOSIS — Z79899 Other long term (current) drug therapy: Secondary | ICD-10-CM | POA: Insufficient documentation

## 2014-06-20 DIAGNOSIS — F0391 Unspecified dementia with behavioral disturbance: Secondary | ICD-10-CM | POA: Diagnosis present

## 2014-06-20 DIAGNOSIS — K4090 Unilateral inguinal hernia, without obstruction or gangrene, not specified as recurrent: Secondary | ICD-10-CM | POA: Diagnosis present

## 2014-06-20 HISTORY — PX: INGUINAL HERNIA REPAIR: SHX194

## 2014-06-20 HISTORY — PX: INSERTION OF MESH: SHX5868

## 2014-06-20 LAB — BASIC METABOLIC PANEL
Anion gap: 11 (ref 5–15)
BUN: 15 mg/dL (ref 6–23)
CHLORIDE: 103 meq/L (ref 96–112)
CO2: 27 mEq/L (ref 19–32)
Calcium: 9.3 mg/dL (ref 8.4–10.5)
Creatinine, Ser: 1.37 mg/dL — ABNORMAL HIGH (ref 0.50–1.35)
GFR calc non Af Amer: 49 mL/min — ABNORMAL LOW (ref >90)
GFR, EST AFRICAN AMERICAN: 56 mL/min — AB (ref >90)
Glucose, Bld: 92 mg/dL (ref 70–99)
Potassium: 4.2 mEq/L (ref 3.7–5.3)
SODIUM: 141 meq/L (ref 137–147)

## 2014-06-20 LAB — CBC
HEMATOCRIT: 40.9 % (ref 39.0–52.0)
HEMOGLOBIN: 14.3 g/dL (ref 13.0–17.0)
MCH: 30.4 pg (ref 26.0–34.0)
MCHC: 35 g/dL (ref 30.0–36.0)
MCV: 87 fL (ref 78.0–100.0)
Platelets: 164 10*3/uL (ref 150–400)
RBC: 4.7 MIL/uL (ref 4.22–5.81)
RDW: 12.7 % (ref 11.5–15.5)
WBC: 6.6 10*3/uL (ref 4.0–10.5)

## 2014-06-20 SURGERY — REPAIR, HERNIA, INGUINAL, LAPAROSCOPIC
Anesthesia: General | Laterality: Bilateral

## 2014-06-20 SURGERY — REPAIR, HERNIA, INGUINAL, LAPAROSCOPIC
Anesthesia: General | Site: Abdomen

## 2014-06-20 MED ORDER — LACTATED RINGERS IV SOLN: INTRAVENOUS | Status: DC

## 2014-06-20 MED ORDER — ESMOLOL HCL 10 MG/ML IV SOLN
INTRAVENOUS | Status: AC
  Filled 2014-06-20: qty 10

## 2014-06-20 MED ORDER — MIDAZOLAM HCL 2 MG/2ML IJ SOLN
INTRAMUSCULAR | Status: AC
  Filled 2014-06-20: qty 2

## 2014-06-20 MED ORDER — MAGIC MOUTHWASH
15.0000 mL | Freq: Four times a day (QID) | ORAL | Status: DC | PRN
  Filled 2014-06-20: qty 15

## 2014-06-20 MED ORDER — EPHEDRINE SULFATE 50 MG/ML IJ SOLN
INTRAMUSCULAR | Status: DC | PRN
  Administered 2014-06-20 (×2): 5 mg via INTRAVENOUS

## 2014-06-20 MED ORDER — BUPIVACAINE-EPINEPHRINE 0.25% -1:200000 IJ SOLN
INTRAMUSCULAR | Status: DC | PRN
  Administered 2014-06-20: 75 mL

## 2014-06-20 MED ORDER — VITAMIN D3 25 MCG (1000 UNIT) PO TABS
1000.0000 [IU] | ORAL_TABLET | Freq: Every day | ORAL | Status: DC
  Administered 2014-06-20 – 2014-06-21 (×2): 1000 [IU] via ORAL
  Filled 2014-06-20 (×2): qty 1

## 2014-06-20 MED ORDER — BUPIVACAINE-EPINEPHRINE (PF) 0.25% -1:200000 IJ SOLN
INTRAMUSCULAR | Status: AC
  Filled 2014-06-20: qty 30

## 2014-06-20 MED ORDER — LORAZEPAM 2 MG/ML IJ SOLN: 0.5000 mg | Freq: Three times a day (TID) | INTRAMUSCULAR | Status: DC | PRN

## 2014-06-20 MED ORDER — CHLORHEXIDINE GLUCONATE 4 % EX LIQD: 1.0000 "application " | Freq: Once | CUTANEOUS | Status: DC

## 2014-06-20 MED ORDER — ONDANSETRON HCL 4 MG/2ML IJ SOLN: 4.0000 mg | Freq: Four times a day (QID) | INTRAMUSCULAR | Status: DC | PRN

## 2014-06-20 MED ORDER — MAGNESIUM HYDROXIDE 400 MG/5ML PO SUSP: 30.0000 mL | Freq: Every day | ORAL | Status: DC | PRN

## 2014-06-20 MED ORDER — LIP MEDEX EX OINT
1.0000 "application " | TOPICAL_OINTMENT | Freq: Two times a day (BID) | CUTANEOUS | Status: DC
  Administered 2014-06-20 – 2014-06-21 (×2): 1 via TOPICAL
  Filled 2014-06-20: qty 7

## 2014-06-20 MED ORDER — NEOSTIGMINE METHYLSULFATE 10 MG/10ML IV SOLN
INTRAVENOUS | Status: AC
  Filled 2014-06-20: qty 1

## 2014-06-20 MED ORDER — TRAMADOL HCL 50 MG PO TABS: 50.0000 mg | ORAL_TABLET | Freq: Four times a day (QID) | ORAL | Status: DC | PRN

## 2014-06-20 MED ORDER — PROMETHAZINE HCL 25 MG/ML IJ SOLN: 6.2500 mg | INTRAMUSCULAR | Status: DC | PRN

## 2014-06-20 MED ORDER — FENTANYL CITRATE 0.05 MG/ML IJ SOLN: 25.0000 ug | INTRAMUSCULAR | Status: DC | PRN

## 2014-06-20 MED ORDER — GUAIFENESIN 100 MG/5ML PO SYRP
200.0000 mg | ORAL_SOLUTION | Freq: Three times a day (TID) | ORAL | Status: DC | PRN
  Filled 2014-06-20: qty 10

## 2014-06-20 MED ORDER — STERILE WATER FOR IRRIGATION IR SOLN
Status: DC | PRN
  Administered 2014-06-20: 1500 mL

## 2014-06-20 MED ORDER — HALOPERIDOL 0.5 MG PO TABS
0.5000 mg | ORAL_TABLET | ORAL | Status: DC | PRN
  Filled 2014-06-20: qty 1

## 2014-06-20 MED ORDER — LISINOPRIL 5 MG PO TABS
5.0000 mg | ORAL_TABLET | Freq: Every day | ORAL | Status: DC
Start: 2014-06-20 — End: 2014-06-21
  Administered 2014-06-20: 5 mg via ORAL
  Filled 2014-06-20 (×2): qty 1

## 2014-06-20 MED ORDER — LIDOCAINE HCL (CARDIAC) 20 MG/ML IV SOLN
INTRAVENOUS | Status: AC
  Filled 2014-06-20: qty 5

## 2014-06-20 MED ORDER — METOPROLOL TARTRATE 1 MG/ML IV SOLN
5.0000 mg | Freq: Four times a day (QID) | INTRAVENOUS | Status: DC | PRN
  Filled 2014-06-20: qty 5

## 2014-06-20 MED ORDER — ONDANSETRON HCL 4 MG PO TABS: 4.0000 mg | ORAL_TABLET | Freq: Four times a day (QID) | ORAL | Status: DC | PRN

## 2014-06-20 MED ORDER — MENTHOL 3 MG MT LOZG
1.0000 | LOZENGE | OROMUCOSAL | Status: DC | PRN
  Filled 2014-06-20: qty 9

## 2014-06-20 MED ORDER — CEFAZOLIN SODIUM-DEXTROSE 2-3 GM-% IV SOLR
INTRAVENOUS | Status: DC | PRN
  Administered 2014-06-20: 2 g via INTRAVENOUS

## 2014-06-20 MED ORDER — GLYCOPYRROLATE 0.2 MG/ML IJ SOLN
INTRAMUSCULAR | Status: DC | PRN
  Administered 2014-06-20: 0.6 mg via INTRAVENOUS
  Administered 2014-06-20: 0.2 mg via INTRAVENOUS

## 2014-06-20 MED ORDER — ACETAMINOPHEN 500 MG PO TABS: 500.0000 mg | ORAL_TABLET | ORAL | Status: DC | PRN

## 2014-06-20 MED ORDER — DIPHENHYDRAMINE HCL 50 MG/ML IJ SOLN: 12.5000 mg | Freq: Four times a day (QID) | INTRAMUSCULAR | Status: DC | PRN

## 2014-06-20 MED ORDER — FENTANYL CITRATE 0.05 MG/ML IJ SOLN
INTRAMUSCULAR | Status: AC
  Filled 2014-06-20: qty 5

## 2014-06-20 MED ORDER — HALOPERIDOL LACTATE 5 MG/ML IJ SOLN: 2.0000 mg | Freq: Four times a day (QID) | INTRAMUSCULAR | Status: DC | PRN

## 2014-06-20 MED ORDER — ASPIRIN 81 MG PO CHEW
81.0000 mg | CHEWABLE_TABLET | Freq: Every day | ORAL | Status: DC
  Administered 2014-06-20 – 2014-06-21 (×2): 81 mg via ORAL
  Filled 2014-06-20 (×3): qty 1

## 2014-06-20 MED ORDER — DIVALPROEX SODIUM 125 MG PO CPSP
125.0000 mg | ORAL_CAPSULE | Freq: Two times a day (BID) | ORAL | Status: DC
  Administered 2014-06-20 – 2014-06-21 (×2): 125 mg via ORAL
  Filled 2014-06-20 (×3): qty 1

## 2014-06-20 MED ORDER — ONDANSETRON HCL 4 MG/2ML IJ SOLN
INTRAMUSCULAR | Status: AC
  Filled 2014-06-20: qty 2

## 2014-06-20 MED ORDER — ESMOLOL HCL 10 MG/ML IV SOLN
INTRAVENOUS | Status: DC | PRN
  Administered 2014-06-20 (×2): 10 mg via INTRAVENOUS
  Administered 2014-06-20: 30 mg via INTRAVENOUS

## 2014-06-20 MED ORDER — INFLUENZA VAC SPLIT QUAD 0.5 ML IM SUSY
0.5000 mL | PREFILLED_SYRINGE | INTRAMUSCULAR | Status: AC
  Administered 2014-06-21: 0.5 mL via INTRAMUSCULAR
  Filled 2014-06-20 (×2): qty 0.5

## 2014-06-20 MED ORDER — METOPROLOL SUCCINATE ER 25 MG PO TB24
25.0000 mg | ORAL_TABLET | Freq: Every morning | ORAL | Status: DC
  Administered 2014-06-21: 25 mg via ORAL
  Filled 2014-06-20: qty 1

## 2014-06-20 MED ORDER — SUCCINYLCHOLINE CHLORIDE 20 MG/ML IJ SOLN
INTRAMUSCULAR | Status: DC | PRN
  Administered 2014-06-20: 100 mg via INTRAVENOUS

## 2014-06-20 MED ORDER — 0.9 % SODIUM CHLORIDE (POUR BTL) OPTIME
TOPICAL | Status: DC | PRN
  Administered 2014-06-20: 1000 mL

## 2014-06-20 MED ORDER — NEOSTIGMINE METHYLSULFATE 10 MG/10ML IV SOLN
INTRAVENOUS | Status: DC | PRN
  Administered 2014-06-20: 4 mg via INTRAVENOUS

## 2014-06-20 MED ORDER — POLYETHYLENE GLYCOL 3350 17 G PO PACK
17.0000 g | PACK | Freq: Two times a day (BID) | ORAL | Status: DC | PRN
  Filled 2014-06-20: qty 1

## 2014-06-20 MED ORDER — CEFAZOLIN SODIUM 1-5 GM-% IV SOLN
1.0000 g | Freq: Four times a day (QID) | INTRAVENOUS | Status: AC
  Administered 2014-06-20 – 2014-06-21 (×3): 1 g via INTRAVENOUS
  Filled 2014-06-20 (×3): qty 50

## 2014-06-20 MED ORDER — LACTATED RINGERS IV BOLUS (SEPSIS): 1000.0000 mL | Freq: Three times a day (TID) | INTRAVENOUS | Status: DC | PRN

## 2014-06-20 MED ORDER — HYDROMORPHONE HCL PF 1 MG/ML IJ SOLN: 0.5000 mg | INTRAMUSCULAR | Status: DC | PRN

## 2014-06-20 MED ORDER — SODIUM CHLORIDE 0.9 % IJ SOLN: 3.0000 mL | INTRAMUSCULAR | Status: DC | PRN

## 2014-06-20 MED ORDER — HYDROMORPHONE HCL PF 1 MG/ML IJ SOLN: 0.2500 mg | INTRAMUSCULAR | Status: DC | PRN

## 2014-06-20 MED ORDER — MIRTAZAPINE 30 MG PO TABS
30.0000 mg | ORAL_TABLET | Freq: Every day | ORAL | Status: DC
  Administered 2014-06-20: 30 mg via ORAL
  Filled 2014-06-20 (×2): qty 1

## 2014-06-20 MED ORDER — ONDANSETRON HCL 4 MG/2ML IJ SOLN
INTRAMUSCULAR | Status: DC | PRN
  Administered 2014-06-20: 4 mg via INTRAVENOUS

## 2014-06-20 MED ORDER — DEXAMETHASONE SODIUM PHOSPHATE 10 MG/ML IJ SOLN
INTRAMUSCULAR | Status: DC | PRN
  Administered 2014-06-20: 10 mg via INTRAVENOUS

## 2014-06-20 MED ORDER — PNEUMOCOCCAL VAC POLYVALENT 25 MCG/0.5ML IJ INJ
0.5000 mL | INJECTION | INTRAMUSCULAR | Status: AC
  Administered 2014-06-21: 0.5 mL via INTRAMUSCULAR
  Filled 2014-06-20 (×2): qty 0.5

## 2014-06-20 MED ORDER — PHENOL 1.4 % MT LIQD: 2.0000 | OROMUCOSAL | Status: DC | PRN

## 2014-06-20 MED ORDER — LACTATED RINGERS IV SOLN
INTRAVENOUS | Status: DC
  Administered 2014-06-20: 16:00:00 via INTRAVENOUS

## 2014-06-20 MED ORDER — SACCHAROMYCES BOULARDII 250 MG PO CAPS
250.0000 mg | ORAL_CAPSULE | Freq: Two times a day (BID) | ORAL | Status: DC
  Administered 2014-06-20 – 2014-06-21 (×2): 250 mg via ORAL
  Filled 2014-06-20 (×3): qty 1

## 2014-06-20 MED ORDER — DONEPEZIL HCL 10 MG PO TABS
10.0000 mg | ORAL_TABLET | Freq: Every day | ORAL | Status: DC
  Administered 2014-06-20: 10 mg via ORAL
  Filled 2014-06-20 (×2): qty 1

## 2014-06-20 MED ORDER — OXYCODONE HCL 5 MG PO TABS: 5.0000 mg | ORAL_TABLET | Freq: Once | ORAL | Status: DC | PRN

## 2014-06-20 MED ORDER — PROPOFOL 10 MG/ML IV BOLUS
INTRAVENOUS | Status: AC
  Filled 2014-06-20: qty 20

## 2014-06-20 MED ORDER — ACETAMINOPHEN 500 MG PO TABS
1000.0000 mg | ORAL_TABLET | Freq: Three times a day (TID) | ORAL | Status: AC
  Administered 2014-06-20 – 2014-06-21 (×3): 1000 mg via ORAL
  Filled 2014-06-20 (×3): qty 2

## 2014-06-20 MED ORDER — MEPERIDINE HCL 50 MG/ML IJ SOLN: 6.2500 mg | INTRAMUSCULAR | Status: DC | PRN

## 2014-06-20 MED ORDER — CEFAZOLIN SODIUM-DEXTROSE 2-3 GM-% IV SOLR
INTRAVENOUS | Status: AC
  Filled 2014-06-20: qty 50

## 2014-06-20 MED ORDER — SODIUM CHLORIDE 0.9 % IV SOLN: 250.0000 mL | INTRAVENOUS | Status: DC | PRN

## 2014-06-20 MED ORDER — FENTANYL CITRATE 0.05 MG/ML IJ SOLN
INTRAMUSCULAR | Status: DC | PRN
  Administered 2014-06-20 (×10): 50 ug via INTRAVENOUS

## 2014-06-20 MED ORDER — GLYCOPYRROLATE 0.2 MG/ML IJ SOLN
INTRAMUSCULAR | Status: AC
  Filled 2014-06-20: qty 3

## 2014-06-20 MED ORDER — ALUM & MAG HYDROXIDE-SIMETH 200-200-20 MG/5ML PO SUSP: 30.0000 mL | Freq: Four times a day (QID) | ORAL | Status: DC | PRN

## 2014-06-20 MED ORDER — ROCURONIUM BROMIDE 100 MG/10ML IV SOLN
INTRAVENOUS | Status: DC | PRN
  Administered 2014-06-20: 5 mg via INTRAVENOUS
  Administered 2014-06-20: 40 mg via INTRAVENOUS
  Administered 2014-06-20 (×2): 10 mg via INTRAVENOUS

## 2014-06-20 MED ORDER — SODIUM CHLORIDE 0.9 % IV BOLUS (SEPSIS)
500.0000 mL | Freq: Once | INTRAVENOUS | Status: AC
  Administered 2014-06-20: 500 mL via INTRAVENOUS

## 2014-06-20 MED ORDER — HEPARIN SODIUM (PORCINE) 5000 UNIT/ML IJ SOLN
5000.0000 [IU] | Freq: Three times a day (TID) | INTRAMUSCULAR | Status: DC
  Administered 2014-06-21 (×2): 5000 [IU] via SUBCUTANEOUS
  Filled 2014-06-20 (×4): qty 1

## 2014-06-20 MED ORDER — ROCURONIUM BROMIDE 100 MG/10ML IV SOLN
INTRAVENOUS | Status: AC
  Filled 2014-06-20: qty 1

## 2014-06-20 MED ORDER — TRAMADOL HCL 50 MG PO TABS
50.0000 mg | ORAL_TABLET | Freq: Four times a day (QID) | ORAL | Status: DC | PRN
  Administered 2014-06-20: 50 mg via ORAL
  Administered 2014-06-21: 100 mg via ORAL
  Administered 2014-06-21: 50 mg via ORAL
  Filled 2014-06-20: qty 2
  Filled 2014-06-20 (×2): qty 1

## 2014-06-20 MED ORDER — LIDOCAINE HCL (CARDIAC) 20 MG/ML IV SOLN
INTRAVENOUS | Status: DC | PRN
  Administered 2014-06-20: 100 mg via INTRAVENOUS

## 2014-06-20 MED ORDER — LACTATED RINGERS IV SOLN
INTRAVENOUS | Status: DC | PRN
  Administered 2014-06-20 (×2): via INTRAVENOUS

## 2014-06-20 MED ORDER — PROPOFOL 10 MG/ML IV BOLUS
INTRAVENOUS | Status: DC | PRN
  Administered 2014-06-20: 170 mg via INTRAVENOUS

## 2014-06-20 MED ORDER — PSYLLIUM 95 % PO PACK
1.0000 | PACK | Freq: Two times a day (BID) | ORAL | Status: DC
  Administered 2014-06-20 – 2014-06-21 (×2): 1 via ORAL
  Filled 2014-06-20 (×3): qty 1

## 2014-06-20 MED ORDER — SODIUM CHLORIDE 0.9 % IJ SOLN
3.0000 mL | Freq: Two times a day (BID) | INTRAMUSCULAR | Status: DC
  Administered 2014-06-20: 3 mL via INTRAVENOUS

## 2014-06-20 MED ORDER — OXYCODONE HCL 5 MG/5ML PO SOLN
5.0000 mg | Freq: Once | ORAL | Status: DC | PRN
  Filled 2014-06-20: qty 5

## 2014-06-20 SURGICAL SUPPLY — 42 items
BINDER ABDOMINAL 12 ML 46-62 (SOFTGOODS) ×4 IMPLANT
CABLE HIGH FREQUENCY MONO STRZ (ELECTRODE) IMPLANT
CATH FOLEY 2WAY 5CC 16FR (CATHETERS) ×2
CATH URTH STD 16FR FL 2W DRN (CATHETERS) ×2 IMPLANT
CHLORAPREP W/TINT 26ML (MISCELLANEOUS) ×4 IMPLANT
CLOSURE WOUND 1/2 X4 (GAUZE/BANDAGES/DRESSINGS) ×1
DECANTER SPIKE VIAL GLASS SM (MISCELLANEOUS) IMPLANT
DEVICE SECURE STRAP 25 ABSORB (INSTRUMENTS) ×4 IMPLANT
DEVICE TROCAR PUNCTURE CLOSURE (ENDOMECHANICALS) ×4 IMPLANT
DRAPE LAPAROSCOPIC ABDOMINAL (DRAPES) ×4 IMPLANT
DRAPE WARM FLUID 44X44 (DRAPE) ×4 IMPLANT
DRSG TEGADERM 2-3/8X2-3/4 SM (GAUZE/BANDAGES/DRESSINGS) ×12 IMPLANT
DRSG TEGADERM 4X4.75 (GAUZE/BANDAGES/DRESSINGS) ×4 IMPLANT
ELECT REM PT RETURN 9FT ADLT (ELECTROSURGICAL) ×4
ELECTRODE REM PT RTRN 9FT ADLT (ELECTROSURGICAL) ×2 IMPLANT
GAUZE SPONGE 2X2 8PLY STRL LF (GAUZE/BANDAGES/DRESSINGS) ×2 IMPLANT
GLOVE ECLIPSE 8.0 STRL XLNG CF (GLOVE) ×4 IMPLANT
GLOVE INDICATOR 8.0 STRL GRN (GLOVE) ×4 IMPLANT
GOWN STRL REUS W/TWL XL LVL3 (GOWN DISPOSABLE) ×8 IMPLANT
KIT BASIN OR (CUSTOM PROCEDURE TRAY) ×4 IMPLANT
MESH ULTRAPRO 6X6 15CM15CM (Mesh General) ×12 IMPLANT
MESH VENTRALIGHT ST 4X6IN (Mesh General) ×4 IMPLANT
SCISSORS LAP 5X35 DISP (ENDOMECHANICALS) ×4 IMPLANT
SET IRRIG TUBING LAPAROSCOPIC (IRRIGATION / IRRIGATOR) IMPLANT
SLEEVE XCEL OPT CAN 5 100 (ENDOMECHANICALS) ×4 IMPLANT
SPONGE GAUZE 2X2 STER 10/PKG (GAUZE/BANDAGES/DRESSINGS) ×2
SPONGE LAP 18X18 X RAY DECT (DISPOSABLE) ×4 IMPLANT
STRIP CLOSURE SKIN 1/2X4 (GAUZE/BANDAGES/DRESSINGS) ×3 IMPLANT
SUT MNCRL AB 4-0 PS2 18 (SUTURE) ×4 IMPLANT
SUT PDS AB 0 CT1 36 (SUTURE) ×8 IMPLANT
SUT PROLENE 1 CT 1 30 (SUTURE) ×12 IMPLANT
SUT PROLENE 1 CTX 30  8455H (SUTURE) ×4
SUT PROLENE 1 CTX 30 8455H (SUTURE) ×4 IMPLANT
SUT VIC AB 2-0 SH 27 (SUTURE) ×2
SUT VIC AB 2-0 SH 27X BRD (SUTURE) ×2 IMPLANT
TACKER 5MM HERNIA 3.5CML NAB (ENDOMECHANICALS) IMPLANT
TOWEL OR 17X26 10 PK STRL BLUE (TOWEL DISPOSABLE) ×4 IMPLANT
TOWEL OR NON WOVEN STRL DISP B (DISPOSABLE) ×4 IMPLANT
TRAY LAP CHOLE (CUSTOM PROCEDURE TRAY) ×4 IMPLANT
TROCAR BLADELESS OPT 5 100 (ENDOMECHANICALS) ×4 IMPLANT
TROCAR XCEL BLUNT TIP 100MML (ENDOMECHANICALS) ×4 IMPLANT
TUBING INSUFFLATION 10FT LAP (TUBING) ×4 IMPLANT

## 2014-06-20 NOTE — Progress Notes (Signed)
Dr. Renold Don in- made aware of patient's blood pressures in PACU

## 2014-06-20 NOTE — Anesthesia Postprocedure Evaluation (Signed)
Anesthesia Post Note  Patient: Jacob York  Procedure(s) Performed: Procedure(s) (LRB): LAPAROSCOPIC BILATERAL INGUINAL HERNIA REPAIR, LEFT FEMORAL AND INCARCERATED SUPRA UMBILICAL HERNIA REPAIR WITH MESH (N/A) INSERTION OF MESH (N/A)  Anesthesia type: General  Patient location: PACU  Post pain: Pain level controlled  Post assessment: Post-op Vital signs reviewed  Last Vitals: BP 141/64  Pulse 71  Temp(Src) 36.5 C (Oral)  Resp 16  Ht  (1.702 m)  Wt 173 lb 8 oz (78.699 kg)  BMI 27.17 kg/m2  SpO2 100%  Post vital signs: Reviewed  Level of consciousness: sedated  Complications: No apparent anesthesia complications

## 2014-06-20 NOTE — Progress Notes (Signed)
Received last dose of medications from Theophilus Kinds - med tech surpervisor

## 2014-06-20 NOTE — Transfer of Care (Signed)
Immediate Anesthesia Transfer of Care Note  Patient: Jacob York  Procedure(s) Performed: Procedure(s) (LRB): LAPAROSCOPIC BILATERAL INGUINAL HERNIA REPAIR, LEFT FEMORAL AND INCARCERATED SUPRA UMBILICAL HERNIA REPAIR WITH MESH (N/A) INSERTION OF MESH (N/A)  Patient Location: PACU  Anesthesia Type: General  Level of Consciousness: sedated, patient cooperative and responds to stimulation  Airway & Oxygen Therapy: Patient Spontanous Breathing and Patient connected to face mask oxgen  Post-op Assessment: Report given to PACU RN and Post -op Vital signs reviewed and stable  Post vital signs: Reviewed and stable  Complications: No apparent anesthesia complications

## 2014-06-20 NOTE — Op Note (Signed)
06/20/2014  11:34 AM  PATIENT:  Jacob York  78 y.o. male  Patient Care Team: Ron Parker, MD as PCP - General (Internal Medicine) Caprice Kluver, MD as Consulting Physician (Psychiatry)  PRE-OPERATIVE DIAGNOSIS:  Bilateral Inguinal Hernia, incarcerated supraumbilical ventral wall hernia  POST-OPERATIVE DIAGNOSIS:    Left scrotal inguinal hernia Right inguinal hernia Right femoral hernia Incarcerated supraumbilical ventral wall hernia  PROCEDURE:  Procedure(s): LAPAROSCOPIC REPAIRS OF BILATERAL INGUINAL HERNIA, LEFT FEMORAL AND INCARCERATED SUPRAUMBILICAL HERNIA REPAIR WITH MESH INSERTION OF MESH  SURGEON:  Surgeon(s): Karie Soda, MD  ASSISTANT: RN   ANESTHESIA:   local and general  EBL:  Total I/O In: 1000 [I.V.:1000] Out: 110 [Other:100; Blood:10]  Delay start of Pharmacological VTE agent (>24hrs) due to surgical blood loss or risk of bleeding:  no  DRAINS: none   SPECIMEN:  No Specimen  DISPOSITION OF SPECIMEN:  N/A  COUNTS:  YES  PLAN OF CARE: Admit for overnight observation  PATIENT DISPOSITION:  PACU - hemodynamically stable.  INDICATION: Pleasant active elderly male with some memory loss.  Increasingly symptomatic left scrotal hernia.  Incidental right inguinal hernia as well.  Incarcerated supraumbilical ventral hernia with some sensitivity.  Recommendation made for surgery after long discussion with patient's family about risks of worsening dementia with general anesthesia.  The anatomy & physiology of the abdominal wall and pelvic floor was discussed.  The pathophysiology of hernias in the inguinal and pelvic region was discussed.  Natural history risks such as progressive enlargement, pain, incarceration & strangulation was discussed.   Contributors to complications such as smoking, obesity, diabetes, prior surgery, etc were discussed.    I feel the risks of no intervention will lead to serious problems that outweigh the operative risks;  therefore, I recommended surgery to reduce and repair the hernia.  I explained laparoscopic techniques with possible need for an open approach.  I noted usual use of mesh to patch and/or buttress hernia repair  Risks such as bleeding, infection, abscess, need for further treatment, heart attack, death, and other risks were discussed.  I noted a good likelihood this will help address the problem.   Goals of post-operative recovery were discussed as well.  Possibility that this will not correct all symptoms was explained.  I stressed the importance of low-impact activity, aggressive pain control, avoiding constipation, & not pushing through pain to minimize risk of post-operative chronic pain or injury. Possibility of reherniation was discussed.  We will work to minimize complications.     An educational handout further explaining the pathology & treatment options was given as well.  Questions were answered.  The patient expresses understanding & wishes to proceed with surgery.  OR FINDINGS: Very large left indirect inguinal hernia with colon within it.  Mild laxity of femoral canal but no definite left femoral hernia.  On the right definite direct greater than indirect inguinal hernias.  Moderate femoral hernia as well.  No obturator hernias.  Supraumbilical ventral hernia 2.5 cm incarcerated with omentum.  Very mild epigastric dimpling but no true defect there.  DESCRIPTION:   The patient was identified & brought into the operating room. The patient was positioned supine with arms tucked. SCDs were active during the entire case. The patient underwent general anesthesia without any difficulty.  The abdomen was prepped and draped in a sterile fashion. The patient's bladder was emptied.  A Surgical Timeout confirmed our plan.  I made a transverse incision through the inferior umbilical fold.  I made a  small transverse nick through the anterior rectus fascia contralateral to the inguinal hernia side and  placed a 0-vicryl stitch through the fascia.  I placed a Hasson trocar into the preperitoneal plane.  Entry was clean.  We induced carbon dioxide insufflation. Camera inspection revealed no injury.  I used a 10mm angled scope to bluntly free the peritoneum off the infraumbilical anterior abdominal wall.  I created enough of a preperitoneal pocket to place 5mm ports into the right & left mid-abdomen into this preperitoneal cavity.  I focused attention on the left side since that was the dominant hernia side.   I used blunt & focused sharp dissection to free the peritoneum off the flank and down to the pubic rim.  I freed the anteriolateral bladder wall off the anteriolateral pelvic wall, sparing midline attachments.   I located a swath of peritoneum going into a hernia fascial defect at the indirect ring consistent with a giant left indirect hernia.  I gradually freed the peritoneal hernia sac off safely and reduced it into the preperitoneal space.  I freed the peritoneum off the spermatic vessels & vas deferens.  I freed peritoneum off the retroperitoneum along the psoas muscle.    I checked & assured hemostasis.    I turned attention on the opposite side.  I did dissection in a similar, mirror-image fashion. The patient had a Moderate-sized direct greater than indirect hernia.  Also a small femoral hernia as well.      In freeing off the hernia sacs I did have a breech in the left mid peritoneum.  I closed that using absorbable Vicryl stitch using laparoscopic intracorporeal suturing.   I chose 15x15 cm sheets of ultra-lightweight polypropylene mesh (Ultrapro), one for each side.  I cut a single sigmoid-shaped slit ~6cm from a corner of each mesh.  I placed the meshes into the preperitoneal space & laid them as overlapping diamonds such that at the inferior points, a 6x6 cm corner flap rested in the true anterolateral pelvis, covering the obturator & femoral foramina.   I allowed the bladder to fall back  and help tuck the corners of the mesh in.  The medial corners overlapped each other across midline cephalad to the pubic rim.   This provided >2 inch coverage around the hernia.  Because of the left hernia was giant, I placed a central sheet as a midline diamond as well for a third mesh.  Because of numerous large hernias and poor tissue, I did secure the mesh with absorbable suture strap tacks along the midline rectus cyst, superolaterally, and along the pubic rim.  I redirected the right 5 mm port into the peritoneal cavity.  I placed another 5mm port in the right upper quadrant.  I reduced the omentum in the supraumbilical hernia.  The defect was 2.5 cm.  I decided to repair that with 15 x 10 cm Bard mesh.  Secured it to the abdominal wall using #1 Prolene interrupted stitches around the edges x10.  This provided good overlap around the hernia.  I also primarily repair the hernia through the infraumbilical incision after freeing the umbilical stalk off using interrupted 0 PDS stitches.  I closed the umbilical fascia with 0-PDS suture.  I closed the skin using 4-0 monocryl stitch.  Sterile dressings were applied. The patient was extubated & arrived in the PACU in stable condition..  I had discussed postoperative care with the patient in the holding area.   I did discuss operative findings and  postoperative goals / instructions to the patient's daughter as well.  Instructions are written in the chart.  Ardeth Sportsman, M.D., F.A.C.S. Gastrointestinal and Minimally Invasive Surgery [facility] [address] [place], [place] [address] [phone] Main / Paging

## 2014-06-20 NOTE — Progress Notes (Signed)
Per Jacob Lofts RN will call son who is in process of obtaining POA and if he gives his consent for patients daughter to sign, then we may do this  I spoke on phone to son, Jacob York and he did give consent to proceed as discussed. Consent was obtained ,signed by patient and daughter, Jacob York.  Reason for this is pt has memory loss . He can state his name and birthday and area of body to have procedure. Does not know date,year,president,or his surgeons name.  Daughter is well informed of procedure and she will go to holding area with patient.

## 2014-06-20 NOTE — Interval H&P Note (Signed)
History and Physical Interval Note:  06/20/2014 7:12 AM  Jacob York  has presented today for surgery, with the diagnosis of Bilateral Inguinal Hernia, incarcerated supraumbilical ventral wall hernia  The various methods of treatment have been discussed with the patient and family. After consideration of risks, benefits and other options for treatment, the patient has consented to  Procedure(s): LAPAROSCOPIC BILATERAL INGUINAL HERNIA AND VENTRAL WALL HERNIA REPAIRS WITH MESH (N/A) INSERTION OF MESH (N/A) as a surgical intervention .  The patient's history has been reviewed, patient examined, no change in status, stable for surgery.  I have reviewed the patient's chart and labs.  Questions were answered to the patient's satisfaction.     Charlene Detter C.

## 2014-06-20 NOTE — Progress Notes (Signed)
Dr. Renold Don aware of patient's mentation- O.K. To go to floor

## 2014-06-20 NOTE — H&P (View-Only) (Signed)
Subjective:     Patient ID: Jacob York, male   DOB: [birth year], 78 y.o.   MRN: [id number]  HPI  Note: Portions of this report may have been transcribed using voice recognition software. Every effort was made to ensure accuracy; however, inadvertent computerized transcription errors may be present.   Any transcriptional errors that result from this process are unintentional.            Jacob York  1935-11-03 [id number]  Patient Care Team: Melida Gimenez, MD as PCP - General (Family Medicine) Caprice Kluver, MD as Consulting Physician (Psychiatry)  This patient is a 78 y.o.male who presents today for surgical evaluation at the request of Francee Gentile, NP for [facility].   Reason for visit: Large left inguinal hernia.  Consider surgical repair  Pleasant elderly gentleman with moderate dementia.  Comes today with his son.  Has had a left groin hernia for some time.  Lives at [facility].  The lump has gotten larger and some discomfort.  Noticed by rounding nurse practitioner.  Ultrasound confirms bowel in inguinal hernia.  Surgical consultation requested.  Patient claims to have bowel movement every day without difficulty of urination but does not have great memory.  No prior abdominal surgeries.  Moderately active.  No history of cardiac issues.  History of stroke and distant past but not recently.  Takes baby aspirin but no other blood thinners.  Because of increasing size and discomfort with bowel present, son and covering the medical team wished surgery to be considered.  There are no active problems to display for this patient.   Past Medical History  Diagnosis Date  . Hypertension   . Stroke   . Dementia     History reviewed. No pertinent past surgical history.  History   Social History  . Marital Status: Single    Spouse Name: N/A    Number of Children: N/A  . Years of Education: N/A   Occupational History  . Not  on file.   Social History Main Topics  . Smoking status: Never Smoker   . Smokeless tobacco: Not on file  . Alcohol Use: No  . Drug Use: No  . Sexual Activity: Not on file   Other Topics Concern  . Not on file   Social History Narrative  . No narrative on file    History reviewed. No pertinent family history.  Current Outpatient Prescriptions  Medication Sig Dispense Refill  . alum & mag hydroxide-simeth (MAALOX/MYLANTA) 200-200-20 MG/5ML suspension Take by mouth every 6 (six) hours as needed for indigestion or heartburn.      Marland Kitchen aspirin 81 MG chewable tablet Chew 81 mg by mouth daily.      . cholecalciferol (VITAMIN D) 400 UNITS TABS tablet Take 400 Units by mouth.      . donepezil (ARICEPT) 10 MG tablet Take 10 mg by mouth at bedtime.      Marland Kitchen guaifenesin (ROBITUSSIN) 100 MG/5ML syrup Take 200 mg by mouth 3 (three) times daily as needed for cough.      . haloperidol (HALDOL) 0.5 MG tablet Take 0.5 mg by mouth every 4 (four) hours as needed for agitation.      Marland Kitchen lisinopril (PRINIVIL,ZESTRIL) 5 MG tablet Take 5 mg by mouth daily.      Marland Kitchen loperamide (IMODIUM) 2 MG capsule Take by mouth as needed for diarrhea or loose stools.      . magnesium hydroxide (MILK OF MAGNESIA)  400 MG/5ML suspension Take by mouth daily as needed for mild constipation.      . metoprolol succinate (TOPROL-XL) 25 MG 24 hr tablet Take 25 mg by mouth daily.      . mirtazapine (REMERON) 30 MG tablet Take 30 mg by mouth at bedtime.      Marland Kitchen neomycin-bacitracin-polymyxin (NEOSPORIN) [phone] ointment Apply topically 4 (four) times daily.       No current facility-administered medications for this visit.     No Known Allergies  BP 122/80  Pulse 77  Temp(Src) 97.5 F (36.4 C)  Ht  (1.651 m)  Wt 176 lb (79.833 kg)  BMI 29.29 kg/m2  No results found.   Review of Systems  Constitutional: Negative for fever, chills and diaphoresis.  HENT: Negative for ear discharge, facial swelling, mouth sores,  nosebleeds, sore throat and trouble swallowing.   Eyes: Negative for photophobia, discharge and visual disturbance.  Respiratory: Negative for choking, chest tightness, shortness of breath and stridor.   Cardiovascular: Negative for chest pain and palpitations.  Gastrointestinal: Negative for nausea, vomiting, abdominal pain, diarrhea, constipation, blood in stool, abdominal distention, anal bleeding and rectal pain.  Endocrine: Negative for cold intolerance and heat intolerance.  Genitourinary: Negative for dysuria, urgency, difficulty urinating and testicular pain.  Musculoskeletal: Negative for arthralgias, back pain, gait problem and myalgias.  Skin: Negative for color change, pallor, rash and wound.  Allergic/Immunologic: Negative for environmental allergies and food allergies.  Neurological: Negative for dizziness, tremors, facial asymmetry, speech difficulty, weakness, numbness and headaches.  Hematological: Negative for adenopathy. Does not bruise/bleed easily.  Psychiatric/Behavioral: Positive for confusion and decreased concentration. Negative for hallucinations, self-injury, dysphoric mood and agitation. The patient is not nervous/anxious and is not hyperactive.        Objective:   Physical Exam  Constitutional: He is oriented to person, place, and time. He appears well-developed and well-nourished. No distress.  HENT:  Head: Normocephalic.  Mouth/Throat: Oropharynx is clear and moist. No oropharyngeal exudate.  Eyes: Conjunctivae and EOM are normal. Pupils are equal, round, and reactive to light. No scleral icterus.  Neck: Normal range of motion. Neck supple. No tracheal deviation present.  Cardiovascular: Normal rate, regular rhythm and intact distal pulses.   Pulmonary/Chest: Effort normal and breath sounds normal. No respiratory distress.  Abdominal: Soft. He exhibits no distension. There is no tenderness. A hernia is present. Hernia confirmed positive in the ventral area,  confirmed positive in the right inguinal area and confirmed positive in the left inguinal area.    Genitourinary:     Musculoskeletal: Normal range of motion. He exhibits no tenderness.  Lymphadenopathy:    He has no cervical adenopathy.       Right: No inguinal adenopathy present.       Left: No inguinal adenopathy present.  Neurological: He is alert and oriented to person, place, and time. No cranial nerve deficit. He exhibits normal muscle tone. Coordination normal.  Skin: Skin is warm and dry. No rash noted. He is not diaphoretic. No erythema. No pallor.  Psychiatric: He has a normal mood and affect. His speech is normal. His affect is not angry. He is not agitated, not aggressive, not slowed and not withdrawn. Thought content is not paranoid. Cognition and memory are impaired. He does not exhibit a depressed mood. He expresses no homicidal ideation. He exhibits abnormal recent memory and abnormal remote memory. He is inattentive.       Assessment:     Pleasant male with left scrotal  and small right inguinal hernias.  Incarcerated supraumbilical hernia in the setting of diastasis recti.  Mild/moderate dementia but stable.     Plan:     Standard of care is repair of hernias, especially the inguinal ones.  Increased risk of seroma/hematoma but still doable.  His risks of memory loss and dementia with general anesthesia her R. Possible, but his risk of incarceration/strangulation with bowel RD in the inguinal hernia I think is a higher risk.  The patient and his son agreed.  They are interested in proceeding.  Plan laparoscopic approach.  Hopefully the supraumbilical hernia small enough that the stitches are necessary, but mesh may be needed there as well.  Usually patient came to home.  May need overnight stay given his recovery and postoperative area.  The son is going to check and see if the memory loss assisted living institution can handle postoperative hernia surgery:  The anatomy  & physiology of the abdominal wall and pelvic floor was discussed.  The pathophysiology of hernias in the inguinal and pelvic region was discussed.  Natural history risks such as progressive enlargement, pain, incarceration & strangulation was discussed.   Contributors to complications such as smoking, obesity, diabetes, prior surgery, etc were discussed.    I feel the risks of no intervention will lead to serious problems that outweigh the operative risks; therefore, I recommended surgery to reduce and repair the hernia.  I explained laparoscopic techniques with possible need for an open approach.  I noted usual use of mesh to patch and/or buttress hernia repair  Risks such as bleeding, infection, abscess, need for further treatment, heart attack, death, and other risks were discussed.  I noted a good likelihood this will help address the problem.   Goals of post-operative recovery were discussed as well.  Possibility that this will not correct all symptoms was explained.  I stressed the importance of low-impact activity, aggressive pain control, avoiding constipation, & not pushing through pain to minimize risk of post-operative chronic pain or injury. Possibility of reherniation was discussed.  We will work to minimize complications.     An educational handout further explaining the pathology & treatment options was given as well.  Questions were answered.  The patient expresses understanding & wishes to proceed with surgery.

## 2014-06-21 ENCOUNTER — Encounter (HOSPITAL_COMMUNITY): Payer: Self-pay | Admitting: Surgery

## 2014-06-21 DIAGNOSIS — K4020 Bilateral inguinal hernia, without obstruction or gangrene, not specified as recurrent: Secondary | ICD-10-CM | POA: Diagnosis not present

## 2014-06-21 MED ORDER — SODIUM CHLORIDE 0.9 % IJ SOLN
3.0000 mL | Freq: Two times a day (BID) | INTRAMUSCULAR | Status: DC
  Administered 2014-06-21: 3 mL via INTRAVENOUS

## 2014-06-21 MED ORDER — LACTATED RINGERS IV BOLUS (SEPSIS): 1000.0000 mL | Freq: Three times a day (TID) | INTRAVENOUS | Status: DC | PRN

## 2014-06-21 MED ORDER — CEFAZOLIN SODIUM-DEXTROSE 2-3 GM-% IV SOLR: 2.0000 g | INTRAVENOUS | Status: DC

## 2014-06-21 MED ORDER — SODIUM CHLORIDE 0.9 % IV SOLN: 250.0000 mL | INTRAVENOUS | Status: DC | PRN

## 2014-06-21 MED ORDER — SODIUM CHLORIDE 0.9 % IJ SOLN: 3.0000 mL | INTRAMUSCULAR | Status: DC | PRN

## 2014-06-21 MED ORDER — MAGNESIUM HYDROXIDE 400 MG/5ML PO SUSP: 30.0000 mL | Freq: Every day | ORAL | Status: DC | PRN

## 2014-06-21 NOTE — Progress Notes (Signed)
61F coude catheter placed by 4th floor RN Cala Bradford. 550ccs of clear urine returned. Foley clamped at 500ccs for 5 minutes. Pt expresses relief. Will continue to monitor.   Sherron Monday

## 2014-06-21 NOTE — Progress Notes (Signed)
Utilization Review Completed.Alvina Strother T9/15/2015  

## 2014-06-21 NOTE — Discharge Summary (Signed)
Physician Discharge Summary  Patient ID: Jacob York MRN: [id number] DOB/AGE: 1935/12/06 78 y.o.  Admit date: 06/20/2014 Discharge date: 06/21/2014  Admission Diagnoses:  Discharge Diagnoses:  Principal Problem:   Bilateral inguinal hernia (BIH) L>>R Active Problems:   Dementia   Incarcerated ventral hernia - 4cm supraumbilical   Ventral hernia   POST-OPERATIVE DIAGNOSIS:  Left scrotal inguinal hernia  Right inguinal hernia  Right femoral hernia  Incarcerated supraumbilical ventral wall hernia  PROCEDURE: Procedure(s):  LAPAROSCOPIC REPAIRS OF BILATERAL INGUINAL HERNIA, LEFT FEMORAL AND INCARCERATED SUPRAUMBILICAL HERNIA REPAIR WITH MESH  INSERTION OF MESH  SURGEON: Surgeon(s):  Michael Boston, MD   Discharged Condition: good  Hospital Course: The patient underwent the surgery above.  Postoperatively, the patient gradually mobilized in the hallways and advanced to a solid diet.  Pain and other symptoms were treated aggressively.  Patient had difficulty with urinating.  Foley catheter placed.  By the time of discharge, the patient was walking well the hallways, eating food well, having flatus.  Pain was well-controlled on an oral regimen.  Based on meeting discharge criteria and continuing to recover, I felt it was safe for the patient to be discharged from the hospital with close followup.  Instructions were discussed in detail.  They are written as well.     Consults: None  Significant Diagnostic Studies:   Results for orders placed during the hospital encounter of 06/20/14 (from the past 72 hour(s))  CBC     Status: None   Collection Time    06/20/14  6:52 AM      Result Value Ref Range   WBC 6.6  4.0 - 10.5 K/uL   RBC 4.70  4.22 - 5.81 MIL/uL   Hemoglobin 14.3  13.0 - 17.0 g/dL   HCT 40.9  39.0 - 52.0 %   MCV 87.0  78.0 - 100.0 fL   MCH 30.4  26.0 - 34.0 pg   MCHC 35.0  30.0 - 36.0 g/dL   RDW 12.7  11.5 - 15.5 %   Platelets 164  150 - 400 K/uL  BASIC  METABOLIC PANEL     Status: Abnormal   Collection Time    06/20/14  6:52 AM      Result Value Ref Range   Sodium 141  137 - 147 mEq/L   Potassium 4.2  3.7 - 5.3 mEq/L   Chloride 103  96 - 112 mEq/L   CO2 27  19 - 32 mEq/L   Glucose, Bld 92  70 - 99 mg/dL   BUN 15  6 - 23 mg/dL   Creatinine, Ser 1.37 (*) 0.50 - 1.35 mg/dL   Calcium 9.3  8.4 - 10.5 mg/dL   GFR calc non Af Amer 49 (*) >90 mL/min   GFR calc Af Amer 56 (*) >90 mL/min   Comment: (NOTE)     The eGFR has been calculated using the CKD EPI equation.     This calculation has not been validated in all clinical situations.     eGFR's persistently <90 mL/min signify possible Chronic Kidney     Disease.   Anion gap 11  5 - 15     Treatments:   Discharge Exam: Blood pressure 118/58, pulse 68, temperature 97.5 F (36.4 C), temperature source Oral, resp. rate 18, height 5' 7"  (1.702 m), weight 173 lb 8 oz (78.699 kg), SpO2 96.00%. ' Physical Exam:  General: Pt awake/alert/oriented x1 in no acute distress  Eyes: PERRL, normal EOM. Sclera clear. No icterus  Neuro: CN II-XII intact w/o focal sensory/motor deficits.  Lymph: No head/neck/groin lymphadenopathy  Psych: No delerium/psychosis/paranoia. Mild dementia stable. Calm & pleasant  HENT: Normocephalic, Mucus membranes moist. No thrush  Neck: Supple, No tracheal deviation  Chest: No chest wall pain w good excursion  CV: Pulses intact. Regular rhythm  MS: Normal AROM mjr joints. No obvious deformity  Abdomen: Soft. Nondistended. Mildly tender at incisions only. No evidence of peritonitis. No incarcerated hernias.  Ext: SCDs BLE. No mjr edema. No cyanosis  Skin: No petechiae / purpura   Disposition: 03-Skilled Nursing Facility  Discharge Instructions   Call MD for:  extreme fatigue    Complete by:  As directed      Call MD for:  hives    Complete by:  As directed      Call MD for:  persistant nausea and vomiting    Complete by:  As directed      Call MD for:   redness, tenderness, or signs of infection (pain, swelling, redness, odor or green/yellow discharge around incision site)    Complete by:  As directed      Call MD for:  severe uncontrolled pain    Complete by:  As directed      Call MD for:    Complete by:  As directed   Temperature > 101.71F     Diet - low sodium heart healthy    Complete by:  As directed      Discharge instructions    Complete by:  As directed   Please see discharge instruction sheets.  Also refer to handout given an office.  Please call our office if you have any questions or concerns [phone]     Discharge wound care:    Complete by:  As directed   If you have closed incisions, shower and bathe over these incisions with soap and water every day.  Remove all surgical dressings on postoperative day #3.  You do not need to replace dressings over the closed incisions unless you feel more comfortable with a Band-Aid covering it.   If you have an open wound that requires packing, please see wound care instructions.  In general, remove all dressings, wash wound with soap and water and then replace with saline moistened gauze.  Do the dressing change at least every day.  Please call our office [phone] if you have further questions.     Driving Restrictions    Complete by:  As directed   No driving until off narcotics and can safely swerve away without pain during an emergency     Increase activity slowly    Complete by:  As directed   Walk an hour a day.  Use 20-30 minute walks.  When you can walk 30 minutes without difficulty, increase to low impact/moderate activities such as biking, jogging, swimming, sexual activity..  Eventually can increase to unrestricted activity when not feeling pain.  If you feel pain: STOP!Marland Kitchen   Let pain protect you from overdoing it.  Use ice/heat/over-the-counter pain medications to help minimize his soreness.  Use pain prescriptions as needed to remain active.  It is better to take extra pain  medications and be more active than to stay bedridden to avoid all pain medications.     Lifting restrictions    Complete by:  As directed   Avoid heavy lifting initially.  Do not push through pain.  You have no specific weight limit.  Coughing and sneezing or four more  stressful to your incision than any lifting you will do. Pain will protect you from injury.  Therefore, avoid intense activity until off all narcotic pain medications.  Coughing and sneezing or four more stressful to your incision than any lifting he will do.     May shower / Bathe    Complete by:  As directed      May walk up steps    Complete by:  As directed      Sexual Activity Restrictions    Complete by:  As directed   Sexual activity as tolerated.  Do not push through pain.  Pain will protect you from injury.     Walk with assistance    Complete by:  As directed   Walk over an hour a day.  May use a walker/cane/companion to help with balance and stamina.            Medication List         acetaminophen 500 MG tablet  Commonly known as:  TYLENOL  Take 500 mg by mouth every 4 (four) hours as needed for mild pain or moderate pain.     alum & mag hydroxide-simeth 200-200-20 MG/5ML suspension  Commonly known as:  MAALOX/MYLANTA  Take 30 mLs by mouth 4 (four) times daily as needed for indigestion or heartburn.     aspirin 81 MG chewable tablet  Chew 81 mg by mouth daily.     cholecalciferol 1000 UNITS tablet  Commonly known as:  VITAMIN D  Take 1,000 Units by mouth daily.     divalproex 125 MG capsule  Commonly known as:  DEPAKOTE SPRINKLE  Take 125 mg by mouth 2 (two) times daily.     donepezil 10 MG tablet  Commonly known as:  ARICEPT  Take 10 mg by mouth at bedtime.     guaifenesin 100 MG/5ML syrup  Commonly known as:  ROBITUSSIN  Take 200 mg by mouth 3 (three) times daily as needed for cough.     haloperidol 0.5 MG tablet  Commonly known as:  HALDOL  Take 0.5 mg by mouth every 4 (four) hours as  needed for agitation.     lisinopril 5 MG tablet  Commonly known as:  PRINIVIL,ZESTRIL  Take 5 mg by mouth every morning.     loperamide 2 MG capsule  Commonly known as:  IMODIUM  Take by mouth as needed for diarrhea or loose stools.     magnesium hydroxide 400 MG/5ML suspension  Commonly known as:  MILK OF MAGNESIA  Take 30 mLs by mouth daily as needed for mild constipation.     metoprolol succinate 25 MG 24 hr tablet  Commonly known as:  TOPROL-XL  Take 25 mg by mouth every morning.     mirtazapine 30 MG tablet  Commonly known as:  REMERON  Take 30 mg by mouth at bedtime.     neomycin-bacitracin-polymyxin [phone] ointment  Apply 1 application topically daily as needed (for skin tears,abrasions,minor irritation).     traMADol 50 MG tablet  Commonly known as:  ULTRAM  Take 1-2 tablets (50-100 mg total) by mouth every 6 (six) hours as needed for moderate pain or severe pain.           Follow-up Information   Follow up with Shaquandra Galano C., MD In 2 weeks. (To follow up after your operation, To follow up after your hospital stay)    Specialty:  General Surgery   Contact information:   [address]  [address] [place] [address] [phone]       Follow up with [facility]. Schedule an appointment as soon as possible for a visit in 3 days. (To have your Foley bladder catheter removed)    Contact information:   [address] [place] [address] [phone]      Signed: Adin Hector. 06/21/2014, 1:16 PM

## 2014-06-21 NOTE — Progress Notes (Signed)
Nutrition Brief Note  Patient identified on the Malnutrition Screening Tool (MST) Report  Wt Readings from Last 15 Encounters:  06/20/14 173 lb 8 oz (78.699 kg)  06/20/14 173 lb 8 oz (78.699 kg)  05/24/14 176 lb (79.833 kg)    Body mass index is 27.17 kg/(m^2). Patient meets criteria for overweight based on current BMI.   Current diet order is heart healthy, patient is consuming approximately 100% of meals at this time. Labs and medications reviewed. Admitted for repairs of bilateral inguinal hernias. Pt with dementia, unable to provide meaningful history. Per telephone conversation with staff at [facility], pt on a regular diet with stable weight and ate 100% of meals and would sometimes go back for seconds.   No nutrition interventions warranted at this time. If nutrition issues arise, please consult RD.   Charlott Rakes MS, RD, LDN [phone] Pager [phone] Weekend/After Hours Pager

## 2014-06-21 NOTE — Progress Notes (Signed)
Pt is ready for d/c back to [facility]. Tammy , Resident Care Coordinator , reviewed clinical info and gave approval for readmission. Family plans to provide transportation back to facility.  Cori Razor LCSW [phone]

## 2014-06-21 NOTE — Progress Notes (Signed)
[address] [place], [place] [address] Phone: [phone] FAX: [phone]    Jacob York [id number] 07/15/1936  CARE TEAM:  PCP: Sande Brothers, MD  Outpatient Care Team: Patient Care Team: Sande Brothers, MD as PCP - General (Internal Medicine) Skip Estimable, MD as Consulting Physician (Psychiatry)  Inpatient Treatment Team: Treatment Team: Attending Provider: Michael Boston, MD; Technician: Leda Quail, NT   Subjective:  UOP fair - foley placed Tol fulls but a little full Pain controlled  Objective:  Vital signs:  Filed Vitals:   06/20/14 1700 06/20/14 2145 06/21/14 0214 06/21/14 0554  BP: 117/78 105/60 105/58 118/49  Pulse: 76 88 75 80  Temp: 97.7 F (36.5 C) 97.8 F (36.6 C) 98.7 F (37.1 C) 98.8 F (37.1 C)  TempSrc: Oral Oral Oral Oral  Resp: 18 18 18 18   Height:      Weight:      SpO2: 100% 99% 95% 95%    Last BM Date: 06/19/14  Intake/Output   Yesterday:  09/14 0701 - 09/15 0700 In: 3680.8 [P.O.:600; I.V.:2580.8] Out: 1380 [Urine:1270; Blood:10] This shift:     Bowel function:  Flatus: y  BM: n  Drain: n/a  Physical Exam:  General: Pt awake/alert/oriented x1 in no acute distress Eyes: PERRL, normal EOM.  Sclera clear.  No icterus Neuro: CN II-XII intact w/o focal sensory/motor deficits. Lymph: No head/neck/groin lymphadenopathy Psych:  No delerium/psychosis/paranoia.  Mild dementia stable.  Calm & pleasant HENT: Normocephalic, Mucus membranes moist.  No thrush Neck: Supple, No tracheal deviation Chest: No chest wall pain w good excursion CV:  Pulses intact.  Regular rhythm MS: Normal AROM mjr joints.  No obvious deformity Abdomen: Soft.  Nondistended.  Mildly tender at incisions only.  No evidence of peritonitis.  No incarcerated hernias. Ext:  SCDs BLE.  No mjr edema.  No cyanosis Skin: No petechiae / purpura   Problem List:   Principal Problem:  Bilateral inguinal hernia (BIH) L>>R Active Problems:   Dementia   Incarcerated ventral hernia - 4cm supraumbilical   Ventral hernia   Assessment  Jacob York  78 y.o. male  1 Day Post-Op  Procedure(s): LAPAROSCOPIC BILATERAL INGUINAL HERNIA REPAIR, LEFT FEMORAL AND INCARCERATED SUPRA UMBILICAL HERNIA REPAIR WITH MESH INSERTION OF MESH  OK  Plan:  -foley x3 days - d/c in office Fri -IVF to ML -solid diet -VTE prophylaxis- SCDs, etc -mobilize as tolerated to help recovery  I updated the patient's status to the patient.  Recommendations were made.  Questions were answered.  The patient expressed understanding & appreciation.  D/C patient from hospital when patient meets criteria (anticipate in 0-1 day(s)):  Tolerating oral intake well Ambulating in walkways Adequate pain control without IV medications Urinating  Having flatus   Adin Hector, M.D., F.A.C.S. Gastrointestinal and Minimally Invasive Surgery [facility] [address] [place], [place] [address] [phone] Main / Paging   06/21/2014   Results:   Labs: Results for orders placed during the hospital encounter of 06/20/14 (from the past 48 hour(s))  CBC     Status: None   Collection Time    06/20/14  6:52 AM      Result Value Ref Range   WBC 6.6  4.0 - 10.5 K/uL   RBC 4.70  4.22 - 5.81 MIL/uL   Hemoglobin 14.3  13.0 - 17.0 g/dL   HCT 40.9  39.0 - 52.0 %   MCV 87.0  78.0 - 100.0 fL   MCH 30.4  26.0 - 34.0 pg   MCHC 35.0  30.0 - 36.0 g/dL   RDW 12.7  11.5 - 15.5 %   Platelets 164  150 - 400 K/uL  BASIC METABOLIC PANEL     Status: Abnormal   Collection Time    06/20/14  6:52 AM      Result Value Ref Range   Sodium 141  137 - 147 mEq/L   Potassium 4.2  3.7 - 5.3 mEq/L   Chloride 103  96 - 112 mEq/L   CO2 27  19 - 32 mEq/L   Glucose, Bld 92  70 - 99 mg/dL   BUN 15  6 - 23 mg/dL   Creatinine, Ser 1.37 (*) 0.50 - 1.35 mg/dL   Calcium 9.3  8.4 - 10.5  mg/dL   GFR calc non Af Amer 49 (*) >90 mL/min   GFR calc Af Amer 56 (*) >90 mL/min   Comment: (NOTE)     The eGFR has been calculated using the CKD EPI equation.     This calculation has not been validated in all clinical situations.     eGFR's persistently <90 mL/min signify possible Chronic Kidney     Disease.   Anion gap 11  5 - 15    Imaging / Studies: Dg Chest 2 View  06/20/2014   CLINICAL DATA:  Preoperative hernia repair ; hypertension  EXAM: CHEST  2 VIEW  COMPARISON:  None.  FINDINGS: There is no edema or consolidation. The heart size and pulmonary vascularity are normal. No adenopathy. There is degenerative change in the thoracic spine.  IMPRESSION: No edema or consolidation.   Electronically Signed   By: Lowella Grip M.D.   On: 06/20/2014 08:20    Medications / Allergies: per chart  Antibiotics: Anti-infectives   Start     Dose/Rate Route Frequency Ordered Stop   06/21/14 0528  ceFAZolin (ANCEF) IVPB 2 g/50 mL premix  Status:  Discontinued     2 g 100 mL/hr over 30 Minutes Intravenous On call to O.R. 06/21/14 0528 06/21/14 0819   06/20/14 1600  ceFAZolin (ANCEF) IVPB 1 g/50 mL premix     1 g 100 mL/hr over 30 Minutes Intravenous Every 6 hours 06/20/14 1408 06/21/14 0431       Note: Portions of this report may have been transcribed using voice recognition software. Every effort was made to ensure accuracy; however, inadvertent computerized transcription errors may be present.   Any transcriptional errors that result from this process are unintentional.

## 2014-06-21 NOTE — Care Management Note (Signed)
    Page 1 of 1   06/21/2014     2:00:37 PM CARE MANAGEMENT NOTE 06/21/2014  Patient:  Jacob York, Jacob York   Account Number:  [id number]  Date Initiated:  06/21/2014  Documentation initiated by:  Lorenda Ishihara  Subjective/Objective Assessment:   78 yo male admitted s/p hernia repair. PTA lived at [facility].     Action/Plan:   Return to ALF when stable   Anticipated DC Date:  06/21/2014   Anticipated DC Plan:  HOME/SELF CARE  In-house referral  Clinical Social Worker      DC Planning Services  CM consult      [facility]  Resumption Of Svcs/PTA Provider   Choice offered to / List presented to:          [facility] arranged  HH-1 RN      [facility] agency  [facility]   Status of service:  Completed, signed off Medicare Important Message given?  NA - LOS <3 / Initial given by admissions (If response is "NO", the following Medicare IM given date fields will be blank) Date Medicare IM given:   Medicare IM given by:   Date Additional Medicare IM given:   Additional Medicare IM given by:    Discharge Disposition:  HOME W HOME HEALTH SERVICES  Per UR Regulation:  Reviewed for med. necessity/level of care/duration of stay  If discussed at Long Length of Stay Meetings, dates discussed:    Comments:

## 2014-06-21 NOTE — Progress Notes (Signed)
Pt unable to void sufficiently by 2100, only 50ccs out. Pt bladder scanned: scanner showed >200ccs. Pt I&O cathed, no urine returned. Paiged MD on call. MD gave orders for a 500cc NS bolus and give pt until 3am to void adequate amount. Have taken pt to [facility] and used urinal multiple times, only able to get out 220ccs since 11:38AM on 9/14. Per MD's previous orders, if pt unable to void adequate amount, bladder scan patient, and if he has a sufficient amount in bladder, place 76F coude catheter.  Bladder scanned pt, scanner shows >200ccs.  Currently awaiting coude catheter to arrive. Will continue to monitor.  Sherron Monday

## 2014-06-21 NOTE — Progress Notes (Signed)
Clinical Social Work Department BRIEF PSYCHOSOCIAL ASSESSMENT 06/21/2014  Patient:  Jacob York, Jacob York     Account Number:  [id number]     Admit date:  06/20/2014  Clinical Social Worker:  Candie Chroman  Date/Time:  06/21/2014 11:32 AM  Referred by:  Physician  Date Referred:  06/21/2014 Referred for  SNF Placement   Other Referral:   Interview type:  Patient Other interview type:    PSYCHOSOCIAL DATA Living Status:  FACILITY Admitted from facility:   Level of care:  Assisted Living Primary support name:  Dannielle Karvonen Primary support relationship to patient:  CHILD, ADULT Degree of support available:   supportive    CURRENT CONCERNS Current Concerns  Post-Acute Placement   Other Concerns:    SOCIAL WORK ASSESSMENT / PLAN Pt is a 78 yr old gentleman admitted from [facility]. This is a planned admission. Pt was admitted under Observation status following surgery yesterday. Pt / family plans for pt to return to ALF today. CSW has contacted ALF and d/c plan has been confirmed. CSW will coordinate d/c to ALF this afternoon.   Assessment/plan status:  Psychosocial Support/Ongoing Assessment of Needs Other assessment/ plan:   Information/referral to community resources:   none needed at this time    PATIENT'S/FAMILY'S RESPONSE TO PLAN OF CARE: Pt is feeling better. His mood is bright . He is looking forward to returning to [facility].   Cori Razor LCSW [phone]

## 2014-06-21 NOTE — Discharge Instructions (Signed)
Indwelling Urinary Catheter Care You have been given a flexible tube (catheter) used to drain the bladder. Catheters are often used when a person has difficulty urinating due to blockage, bleeding, infection, or inability to control bladder or bowel movements (incontinence). A catheter requires daily care to prevent infection and blockage. HOME CARE INSTRUCTIONS  Do the following to reduce the risk of infection. Antibiotic medicines cannot prevent infections. Limit the number of bacteria entering your bladder  Wash your hands for 2 minutes with soapy water before and after handling the catheter.  Wash your bottom and the entire catheter twice daily, as well as after each bowel movement. Wash the tip of the penis or just above the vaginal opening with soap and warm water, rinse, and then wash the rectal area. Always wash from front to back.  When changing from the leg bag to overnight bag or from the overnight bag to leg bag, thoroughly clean the end of the catheter where it connects to the tubing with an alcohol wipe.  Clean the leg bag and overnight bag daily after use. Replace your drainage bags weekly.  Always keep the tubing and bag below the level of your bladder. This allows your urine to drain properly. Lifting the bag or tubing above the level of your bladder will cause dirty urine to flow back into your bladder. If you must briefly lift the bag higher than your bladder, pinch the catheter or tubing to prevent backflow.  Drink enough water and fluids to keep your urine clear or pale yellow, or as directed by your caregiver. This will flush bacteria out of the bladder. Protect tissues from injury  Attach the catheter to your leg so there is no tension on the catheter. Use adhesive tape or a leg strap. If you are using adhesive tape, remove any sticky residue left behind by the previous tape you used.  Place your leg bag on your lower leg. Fasten the straps securely and comfortably.  Do  not remove the catheter yourself unless you have been instructed how to do so. Keep the urinary pathway open  Check throughout the day to be sure your catheter is working and urine is draining freely. Make sure the tubing does not become kinked.  Do not let the drainage bag overfill. SEEK IMMEDIATE MEDICAL CARE IF:   The catheter becomes blocked. Urine is not draining.  Urine is leaking.  You have any pain.  You have a fever. Document Released: 09/23/2005 Document Revised: 09/09/2012 Document Reviewed: 02/22/2010 [facility] Patient Information 2015 [place], [place]. This information is not intended to replace advice given to you by your health care provider. Make sure you discuss any questions you have with your health care provider.  Foley Catheter Care A Foley catheter is a soft, flexible tube that is placed into the bladder to drain urine. A Foley catheter may be inserted if:  You leak urine or are not able to control when you urinate (urinary incontinence).  You are not able to urinate when you need to (urinary retention).  You had prostate surgery or surgery on the genitals.  You have certain medical conditions, such as multiple sclerosis, dementia, or a spinal cord injury. If you are going home with a Foley catheter in place, follow the instructions below. TAKING CARE OF THE CATHETER 1. Wash your hands with soap and water. 2. Using mild soap and warm water on a clean washcloth:  Clean the area on your body closest to the catheter insertion site  using a circular motion, moving away from the catheter. Never wipe toward the catheter because this could sweep bacteria up into the urethra and cause infection.  Remove all traces of soap. Pat the area dry with a clean towel. For males, reposition the foreskin. 3. Attach the catheter to your leg so there is no tension on the catheter. Use adhesive tape or a leg strap. If you are using adhesive tape, remove any sticky residue left behind  by the previous tape you used. 4. Keep the drainage bag below the level of the bladder, but keep it off the floor. 5. Check throughout the day to be sure the catheter is working and urine is draining freely. Make sure the tubing does not become kinked. 6. Do not pull on the catheter or try to remove it. Pulling could damage internal tissues. TAKING CARE OF THE DRAINAGE BAGS You will be given two drainage bags to take home. One is a large overnight drainage bag, and the other is a smaller leg bag that fits underneath clothing. You may wear the overnight bag at any time, but you should never wear the smaller leg bag at night. Follow the instructions below for how to empty, change, and clean your drainage bags. Emptying the Drainage Bag You must empty your drainage bag when it is  - full or at least 2-3 times a day. 1. Wash your hands with soap and water. 2. Keep the drainage bag below your hips, below the level of your bladder. This stops urine from going back into the tubing and into your bladder. 3. Hold the dirty bag over the toilet or a clean container. 4. Open the pour spout at the bottom of the bag and empty the urine into the toilet or container. Do not let the pour spout touch the toilet, container, or any other surface. Doing so can place bacteria on the bag, which can cause an infection. 5. Clean the pour spout with a gauze pad or cotton ball that has rubbing alcohol on it. 6. Close the pour spout. 7. Attach the bag to your leg with adhesive tape or a leg strap. 8. Wash your hands well. Changing the Drainage Bag Change your drainage bag once a month or sooner if it starts to smell bad or look dirty. Below are steps to follow when changing the drainage bag. 1. Wash your hands with soap and water. 2. Pinch off the rubber catheter so that urine does not spill out. 3. Disconnect the catheter tube from the drainage tube at the connection valve. Do not let the tubes touch any  surface. 4. Clean the end of the catheter tube with an alcohol wipe. Use a different alcohol wipe to clean the end of the drainage tube. 5. Connect the catheter tube to the drainage tube of the clean drainage bag. 6. Attach the new bag to the leg with adhesive tape or a leg strap. Avoid attaching the new bag too tightly. 7. Wash your hands well. Cleaning the Drainage Bag 1. Wash your hands with soap and water. 2. Wash the bag in warm, soapy water. 3. Rinse the bag thoroughly with warm water. 4. Fill the bag with a solution of white vinegar and water (1 cup vinegar to 1 qt warm water [.2 L vinegar to 1 L warm water]). Close the bag and soak it for 30 minutes in the solution. 5. Rinse the bag with warm water. 6. Hang the bag to dry with the pour spout open  and hanging downward. 7. Store the clean bag (once it is dry) in a clean plastic bag. 8. Wash your hands well. PREVENTING INFECTION  Wash your hands before and after handling your catheter.  Take showers daily and wash the area where the catheter enters your body. Do not take baths. Replace wet leg straps with dry ones, if this applies.  Do not use powders, sprays, or lotions on the genital area. Only use creams, lotions, or ointments as directed by your caregiver.  For females, wipe from front to back after each bowel movement.  Drink enough fluids to keep your urine clear or pale yellow unless you have a fluid restriction.  Do not let the drainage bag or tubing touch or lie on the floor.  Wear cotton underwear to absorb moisture and to keep your skin drier. SEEK MEDICAL CARE IF:   Your urine is cloudy or smells unusually bad.  Your catheter becomes clogged.  You are not draining urine into the bag or your bladder feels full.  Your catheter starts to leak. SEEK IMMEDIATE MEDICAL CARE IF:   You have pain, swelling, redness, or pus where the catheter enters the body.  You have pain in the abdomen, legs, lower back, or  bladder.  You have a fever.  You see blood fill the catheter, or your urine is pink or red.  You have nausea, vomiting, or chills.  Your catheter gets pulled out. MAKE SURE YOU:   Understand these instructions.  Will watch your condition.  Will get help right away if you are not doing well or get worse. Document Released: 09/23/2005 Document Revised: 02/07/2014 Document Reviewed: 09/14/2012 [facility] Patient Information 2015 [place], [place]. This information is not intended to replace advice given to you by your health care provider. Make sure you discuss any questions you have with your health care provider.    HERNIA REPAIR: POST OP INSTRUCTIONS  1. DIET: Follow a light bland diet the first 24 hours after arrival home, such as soup, liquids, crackers, etc.  Be sure to include lots of fluids daily.  Avoid fast food or heavy meals as your are more likely to get nauseated.  Eat a low fat the next few days after surgery. 2. Take your usually prescribed home medications unless otherwise directed. 3. PAIN CONTROL: a. Pain is best controlled by a usual combination of three different methods TOGETHER: i. Ice/Heat ii. Over the counter pain medication iii. Prescription pain medication b. Most patients will experience some swelling and bruising around the hernia(s) such as the bellybutton, groins, or old incisions.  Ice packs or heating pads (30-60 minutes up to 6 times a day) will help. Use ice for the first few days to help decrease swelling and bruising, then switch to heat to help relax tight/sore spots and speed recovery.  Some people prefer to use ice alone, heat alone, alternating between ice & heat.  Experiment to what works for you.  Swelling and bruising can take several weeks to resolve.   c. It is helpful to take an over-the-counter pain medication regularly for the first few weeks.  Choose one of the following that works best for you: i. Naproxen (Aleve, etc)  Two  tabs twice  a day ii. Ibuprofen (Advil, etc) Three  tabs four times a day (every meal & bedtime) iii. Acetaminophen (Tylenol, etc) 325-650mg  four times a day (every meal & bedtime) d. A  prescription for pain medication should be given to you upon discharge.  Take  your pain medication as prescribed.  i. If you are having problems/concerns with the prescription medicine (does not control pain, nausea, vomiting, rash, itching, etc), please call us [phone] to see if we need to switch you to a different pain medicine that will work better for you and/or control your side effect better. ii. If you need a refill on your pain medication, please contact your pharmacy.  They will contact our office to request authorization. Prescriptions will not be filled after 5 pm or on week-ends. 4. Avoid getting constipated.  Between the surgery and the pain medications, it is common to experience some constipation.  Increasing fluid intake and taking a fiber supplement (such as Metamucil, Citrucel, FiberCon, MiraLax, etc) 1-2 times a day regularly will usually help prevent this problem from occurring.  A mild laxative (prune juice, Milk of Magnesia, MiraLax, etc) should be taken according to package directions if there are no bowel movements after 48 hours.   5. Wash / shower every day.  You may shower over the dressings as they are waterproof.   6. Remove your waterproof bandages 5 days after surgery.  You may leave the incision open to air.  You may replace a dressing/Band-Aid to cover the incision for comfort if you wish.  Continue to shower over incision(s) after the dressing is off.    7. ACTIVITIES as tolerated:   a. You may resume regular (light) daily activities beginning the next day--such as daily self-care, walking, climbing stairs--gradually increasing activities as tolerated.  If you can walk 30 minutes without difficulty, it is safe to try more intense activity such as jogging, treadmill, bicycling,  low-impact aerobics, swimming, etc. b. Save the most intensive and strenuous activity for last such as sit-ups, heavy lifting, contact sports, etc  Refrain from any heavy lifting or straining until you are off narcotics for pain control.   c. DO NOT PUSH THROUGH PAIN.  Let pain be your guide: If it hurts to do something, don't do it.  Pain is your body warning you to avoid that activity for another week until the pain goes down. d. You may drive when you are no longer taking prescription pain medication, you can comfortably wear a seatbelt, and you can safely maneuver your car and apply brakes. e. Bonita Quin may have sexual intercourse when it is comfortable.  8. FOLLOW UP in our office a. Please call [facility] at [phone] to set up an appointment to see your surgeon in the office for a follow-up appointment approximately 2-3 weeks after your surgery. b. Make sure that you call for this appointment the day you arrive home to insure a convenient appointment time. 9.  IF YOU HAVE DISABILITY OR FAMILY LEAVE FORMS, BRING THEM TO THE OFFICE FOR PROCESSING.  DO NOT GIVE THEM TO YOUR DOCTOR.  WHEN TO CALL us [phone]: 1. Poor pain control 2. Reactions / problems with new medications (rash/itching, nausea, etc)  3. Fever over 101.5 F (38.5 C) 4. Inability to urinate 5. Nausea and/or vomiting 6. Worsening swelling or bruising 7. Continued bleeding from incision. 8. Increased pain, redness, or drainage from the incision   The clinic staff is available to answer your questions during regular business hours (8:30am-5pm).  Please dont hesitate to call and ask to speak to one of our nurses for clinical concerns.   If you have a medical emergency, go to the nearest emergency room or call 911.  A surgeon from [facility] is always on  call at the hospitals in [facility], [place] [address], [place], [place]  [address] ?  [address] [address], [place], [place]    [address] MAIN: [phone] ? TOLL FREE: [phone] ? FAX: [phone] [email or web address]  Managing Pain  Pain after surgery or related to activity is often due to strain/injury to muscle, tendon, nerves and/or incisions.  This pain is usually short-term and will improve in a few months.   Many people find it helpful to do the following things TOGETHER to help speed the process of healing and to get back to regular activity more quickly:  1. Avoid heavy physical activity a.  no lifting greater than 20 pounds b. Do not push through the pain.  Listen to your body and avoid positions and maneuvers than reproduce the pain c. Walking is okay as tolerated, but go slowly and stop when getting sore.  d. Remember: If it hurts to do it, then dont do it! 2. Take Anti-inflammatory medication  a. Take with food/snack around the clock for 1-2 weeks i. This helps the muscle and nerve tissues become less irritable and calm down faster b. Choose ONE of the following over-the-counter medications: i. Naproxen  tabs (ex. Aleve) 1-2 pills twice a day  ii. Ibuprofen  tabs (ex. Advil, Motrin) 3-4 pills with every meal and just before bedtime iii. Acetaminophen  tabs (Tylenol) 1-2 pills with every meal and just before bedtime 3. Use a Heating pad or Ice/Cold Pack a. 4-6 times a day b. May use warm bath/hottub  or showers 4. Try Gentle Massage and/or Stretching  a. at the area of pain many times a day b. stop if you feel pain - do not overdo it  Try these steps together to help you body heal faster and avoid making things get worse.  Doing just one of these things may not be enough.    If you are not getting better after two weeks or are noticing you are getting worse, contact our office for further advice; we may need to re-evaluate you & see what other things we can do to help.  GETTING TO GOOD BOWEL HEALTH. Irregular bowel habits such as constipation and diarrhea can  lead to many problems over time.  Having one soft bowel movement a day is the most important way to prevent further problems.  The anorectal canal is designed to handle stretching and feces to safely manage our ability to get rid of solid waste (feces, poop, stool) out of our body.  BUT, hard constipated stools can act like ripping concrete bricks and diarrhea can be a burning fire to this very sensitive area of our body, causing inflamed hemorrhoids, anal fissures, increasing risk is perirectal abscesses, abdominal pain/bloating, an making irritable bowel worse.     The goal: ONE SOFT BOWEL MOVEMENT A DAY!  To have soft, regular bowel movements:    Drink at least 8 tall glasses of water a day.     Take plenty of fiber.  Fiber is the undigested part of plant food that passes into the colon, acting s natures broom to encourage bowel motility and movement.  Fiber can absorb and hold large amounts of water. This results in a larger, bulkier stool, which is soft and easier to pass. Work gradually over several weeks up to 6 servings a day of fiber (25g a day even more if needed) in the form of: o Vegetables -- Root (potatoes, carrots, turnips), leafy green (  lettuce, salad greens, celery, spinach), or cooked high residue (cabbage, broccoli, etc) o Fruit -- Fresh (unpeeled skin & pulp), Dried (prunes, apricots, cherries, etc ),  or stewed ( applesauce)  o Whole grain breads, pasta, etc (whole wheat)  o Bran cereals    Bulking Agents -- This type of water-retaining fiber generally is easily obtained each day by one of the following:  o Psyllium bran -- The psyllium plant is remarkable because its ground seeds can retain so much water. This product is available as Metamucil, Konsyl, Effersyllium, Per Diem Fiber, or the less expensive generic preparation in drug and health food stores. Although labeled a laxative, it really is not a laxative.  o Methylcellulose -- This is another fiber derived from wood which also  retains water. It is available as Citrucel. o Polyethylene Glycol - and artificial fiber commonly called Miralax or Glycolax.  It is helpful for people with gassy or bloated feelings with regular fiber o Flax Seed - a less gassy fiber than psyllium   No reading or other relaxing activity while on the toilet. If bowel movements take longer than 5 minutes, you are too constipated   AVOID CONSTIPATION.  High fiber and water intake usually takes care of this.  Sometimes a laxative is needed to stimulate more frequent bowel movements, but    Laxatives are not a good long-term solution as it can wear the colon out. o Osmotics (Milk of Magnesia, Fleets phosphosoda, Magnesium citrate, MiraLax, GoLytely) are safer than  o Stimulants (Senokot, Castor Oil, Dulcolax, Ex Lax)    o Do not take laxatives for more than 7days in a row.    IF SEVERELY CONSTIPATED, try a Bowel Retraining Program: o Do not use laxatives.  o Eat a diet high in roughage, such as bran cereals and leafy vegetables.  o Drink six (6) ounces of prune or apricot juice each morning.  o Eat two (2) large servings of stewed fruit each day.  o Take one (1) heaping tablespoon of a psyllium-based bulking agent twice a day. Use sugar-free sweetener when possible to avoid excessive calories.  o Eat a normal breakfast.  o Set aside 15 minutes after breakfast to sit on the toilet, but do not strain to have a bowel movement.  o If you do not have a bowel movement by the third day, use an enema and repeat the above steps.    Controlling diarrhea o Switch to liquids and simpler foods for a few days to avoid stressing your intestines further. o Avoid dairy products (especially milk & ice cream) for a short time.  The intestines often can lose the ability to digest lactose when stressed. o Avoid foods that cause gassiness or bloating.  Typical foods include beans and other legumes, cabbage, broccoli, and dairy foods.  Every person has some sensitivity  to other foods, so listen to our body and avoid those foods that trigger problems for you. o Adding fiber (Citrucel, Metamucil, psyllium, Miralax) gradually can help thicken stools by absorbing excess fluid and retrain the intestines to act more normally.  Slowly increase the dose over a few weeks.  Too much fiber too soon can backfire and cause cramping & bloating. o Probiotics (such as active yogurt, Align, etc) may help repopulate the intestines and colon with normal bacteria and calm down a sensitive digestive tract.  Most studies show it to be of mild help, though, and such products can be costly. o Medicines:   Bismuth subsalicylate (ex.  Kayopectate, Pepto Bismol) every 30 minutes for up to 6 doses can help control diarrhea.  Avoid if pregnant.   Loperamide (Immodium) can slow down diarrhea.  Start with two tablets (  total) first and then try one tablet every 6 hours.  Avoid if you are having fevers or severe pain.  If you are not better or start feeling worse, stop all medicines and call your doctor for advice o Call your doctor if you are getting worse or not better.  Sometimes further testing (cultures, endoscopy, X-ray studies, bloodwork, etc) may be needed to help diagnose and treat the cause of the diarrhea.  Alzheimer Disease Caregiver Guide Alzheimer disease is an illness that affects a person's brain. It causes a person to lose the ability to remember things and make good decisions. As the disease progresses, the person is unable to take care of himself or herself and needs more and more help to do simple tasks. Taking care of someone with Alzheimer disease can be very challenging and overwhelming.  MEMORY LOSS AND CONFUSION Memory loss and confusion is mild in the beginning stages of the disease. Both of these problems become more severe as the disease progresses. Eventually, the person will not recognize places or even close family members and friends.   Stay calm.  Respond with a  short explanation. Long explanations can be overwhelming and confusing.  Avoid corrections that sound like scolding.  Try not to take it personally, even if the person forgets your name. BEHAVIOR CHANGES Behavior changes are part of the disease. The person may develop depression, anxiety, anger, hallucinations, or other behavior changes. These changes can come on suddenly and may be in response to pain, infection, changes in the environment (temperature, noise), overstimulation, or feeling lost or scared.   Try not to take behavior changes personally.  Remain calm and patient.  Do not argue or try to convince the person about a specific point. This will only make him or her more agitated.  Know that the behavior changes are part of the disease process and try to work through it. TIPS TO REDUCE FRUSTRATION  Schedule wisely by making appointments and doing daily tasks, like bathing and dressing, when the person is at his or her best.  Take your time. Simple tasks may take a lot longer, so be sure to allow for plenty of time.  Limit choices. Too many choices can be overwhelming and stressful for the person.  Involve the person in what you are doing.  Stick to a routine.  Avoid new or crowded situations, if possible.  Use simple words, short sentences, and a calm voice. Only give one direction at a time.  Buy clothes and shoes that are easy to put on and take off.  Let people help if they offer. HOME SAFETY Keeping the home safe is very important to reduce the risk of falls and injuries.   Keep floors clear of clutter. Remove rugs, magazine racks, and floor lamps.  Keep hallways well lit.  Put a handrail and nonslip mat in the bathtub or shower.  Put childproof locks on cabinets with dangerous items, such as medicine, alcohol, guns, toxic cleaning items, sharp tools or utensils, matches, or lighters.  Place locks on doors where the person cannot easily see or reach them. This  helps ensure that the person cannot wander out of the house and get lost.  Be prepared for emergencies. Keep a list of emergency phone numbers and addresses in a convenient area.  PLANS FOR THE FUTURE  Do not put off talking about finances.  Talk about money management. People with Alzheimer disease have trouble managing their money as the disease gets worse.  Get help from professional advisors regarding financial and legal matters.  Do not put off talking about future care.  Choose a power of attorney. This is someone who can make decisions for the person with Alzheimer disease when he or she is no longer able to do so.  Talk about driving and when it is the right time to stop. The person's health care provider can help give advice on this matter.  Talk about the person's living situation. If he or she lives alone, you need to make sure he or she is safe. Some people need extra help at home, and others need more care at a nursing home or care center. SUPPORT GROUPS Joining a support group can be very helpful for caregivers of people with Alzheimer disease. Some advantages to being part of a support group include:   Getting strategies to manage stress.  Sharing experiences with others.  Receiving emotional comfort and support.  Learning new caregiving skills as the disease progresses.  Knowing what community resources are available and taking advantage of them. SEEK MEDICAL CARE IF:  The person has a fever.  The person has a sudden change in behavior that does not improve with calming strategies.  The person is unable to manage in his or her current living situation.  The person threatens you or anyone else, including himself or herself.  You are no longer able to care for the person. Document Released: 06/04/2004 Document Revised: 02/07/2014 Document Reviewed: 10/30/2011 [facility] Patient Information 2015 [place], [place]. This information is not intended to replace advice  given to you by your health care provider. Make sure you discuss any questions you have with your health care provider.

## 2014-07-01 ENCOUNTER — Encounter (HOSPITAL_COMMUNITY): Payer: Self-pay | Admitting: Emergency Medicine

## 2014-07-01 ENCOUNTER — Emergency Department (HOSPITAL_COMMUNITY)
Admission: EM | Admit: 2014-07-01 | Discharge: 2014-07-02 | Disposition: A | Discharge status: Home / Self Care | Payer: Medicare Other | Attending: Emergency Medicine | Admitting: Emergency Medicine

## 2014-07-01 DIAGNOSIS — Z7982 Long term (current) use of aspirin: Secondary | ICD-10-CM | POA: Insufficient documentation

## 2014-07-01 DIAGNOSIS — F0390 Unspecified dementia without behavioral disturbance: Secondary | ICD-10-CM | POA: Diagnosis not present

## 2014-07-01 DIAGNOSIS — I10 Essential (primary) hypertension: Secondary | ICD-10-CM | POA: Insufficient documentation

## 2014-07-01 DIAGNOSIS — W19XXXA Unspecified fall, initial encounter: Secondary | ICD-10-CM | POA: Diagnosis not present

## 2014-07-01 DIAGNOSIS — Z043 Encounter for examination and observation following other accident: Secondary | ICD-10-CM | POA: Diagnosis present

## 2014-07-01 DIAGNOSIS — Z79899 Other long term (current) drug therapy: Secondary | ICD-10-CM | POA: Diagnosis not present

## 2014-07-01 DIAGNOSIS — Z8673 Personal history of transient ischemic attack (TIA), and cerebral infarction without residual deficits: Secondary | ICD-10-CM | POA: Insufficient documentation

## 2014-07-01 DIAGNOSIS — Y9389 Activity, other specified: Secondary | ICD-10-CM | POA: Insufficient documentation

## 2014-07-01 DIAGNOSIS — Y92129 Unspecified place in nursing home as the place of occurrence of the external cause: Secondary | ICD-10-CM

## 2014-07-01 DIAGNOSIS — R109 Unspecified abdominal pain: Secondary | ICD-10-CM | POA: Diagnosis not present

## 2014-07-01 DIAGNOSIS — Y9210 Unspecified residential institution as the place of occurrence of the external cause: Secondary | ICD-10-CM | POA: Insufficient documentation

## 2014-07-01 NOTE — ED Notes (Signed)
Pt still unable to void at this time 

## 2014-07-01 NOTE — ED Notes (Signed)
Patient placed on ED stretcher alarm. Patient instructed not to get out of bed
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
## 2014-07-01 NOTE — ED Notes (Signed)
Charge nurse notified that patient high fall risk, dementia, and alzheimer's

## 2014-07-01 NOTE — ED Provider Notes (Signed)
CSN: [id number]     Arrival date & time 07/01/14  1938 History   First MD Initiated Contact with Patient 07/01/14 2136     Chief Complaint  Patient presents with  . Fall     (Consider location/radiation/quality/duration/timing/severity/associated sxs/prior Treatment) HPI Comments: Patient is a nursing home resident reportedly found on floor after unwitnessed fall.  No obvious injury noted.  Patient is a 78 y.o. male presenting with fall. The history is provided by the patient.  Fall This is a new problem. The current episode started today. The problem has been resolved.    Past Medical History  Diagnosis Date  . Hypertension   . Stroke   . Dementia    Past Surgical History  Procedure Laterality Date  . Inguinal hernia repair N/A 06/20/2014    Procedure: LAPAROSCOPIC BILATERAL INGUINAL HERNIA REPAIR, LEFT FEMORAL AND INCARCERATED SUPRA UMBILICAL HERNIA REPAIR WITH MESH;  Surgeon: Karie Soda, MD;  Location: [facility];  Service: General;  Laterality: N/A;  . Insertion of mesh N/A 06/20/2014    Procedure: INSERTION OF MESH;  Surgeon: Karie Soda, MD;  Location: [facility];  Service: General;  Laterality: N/A;   No family history on file. History  Substance Use Topics  . Smoking status: Never Smoker   . Smokeless tobacco: Never Used  . Alcohol Use: No    Review of Systems  Unable to perform ROS     Allergies  Review of patient's allergies indicates no known allergies.  Home Medications   Prior to Admission medications   Medication Sig Start Date End Date Taking? Authorizing Provider  acetaminophen (TYLENOL) 500 MG tablet Take 500 mg by mouth every 4 (four) hours as needed for mild pain or moderate pain (pain).    Yes Historical Provider, MD  aspirin 81 MG chewable tablet Chew 81 mg by mouth daily.   Yes Historical Provider, MD  cholecalciferol (VITAMIN D) 1000 UNITS tablet Take 1,000 Units by mouth daily.   Yes Historical Provider, MD  divalproex (DEPAKOTE SPRINKLE) 125 MG  capsule Take 125 mg by mouth 2 (two) times daily.   Yes Historical Provider, MD  donepezil (ARICEPT) 10 MG tablet Take 10 mg by mouth at bedtime.   Yes Historical Provider, MD  lisinopril (PRINIVIL,ZESTRIL) 5 MG tablet Take 5 mg by mouth every morning.    Yes Historical Provider, MD  metoprolol succinate (TOPROL-XL) 25 MG 24 hr tablet Take 25 mg by mouth every morning.    Yes Historical Provider, MD  mirtazapine (REMERON) 30 MG tablet Take 30 mg by mouth at bedtime.   Yes Historical Provider, MD  traMADol (ULTRAM) 50 MG tablet Take 100 mg by mouth every 6 (six) hours as needed for moderate pain or severe pain (pain). 06/20/14  Yes Karie Soda, MD  alum & mag hydroxide-simeth (MAALOX/MYLANTA) 200-200-20 MG/5ML suspension Take 30 mLs by mouth 4 (four) times daily as needed for indigestion or heartburn.     Historical Provider, MD  guaifenesin (ROBITUSSIN) 100 MG/5ML syrup Take 200 mg by mouth 3 (three) times daily as needed for cough.    Historical Provider, MD  haloperidol (HALDOL) 0.5 MG tablet Take 0.5 mg by mouth every 4 (four) hours as needed for agitation.    Historical Provider, MD  loperamide (IMODIUM) 2 MG capsule Take by mouth as needed for diarrhea or loose stools.    Historical Provider, MD  magnesium hydroxide (MILK OF MAGNESIA) 400 MG/5ML suspension Take 30 mLs by mouth daily as needed for mild constipation.  Historical Provider, MD  neomycin-bacitracin-polymyxin (NEOSPORIN) [phone] ointment Apply 1 application topically daily as needed (for skin tears,abrasions,minor irritation).     Historical Provider, MD   BP 149/72  Pulse 62  Temp(Src) 98.3 F (36.8 C) (Oral)  Resp 23  SpO2 100% Physical Exam  Nursing note and vitals reviewed. Constitutional: He appears well-developed and well-nourished. No distress.  HENT:  Head: Normocephalic and atraumatic.  Eyes: EOM are normal. Pupils are equal, round, and reactive to light.  Neck: Neck supple.  Cardiovascular: Normal rate and  regular rhythm.   Pulmonary/Chest: Effort normal and breath sounds normal.  Abdominal: Soft. There is tenderness.  Musculoskeletal: He exhibits no edema and no tenderness.  Lymphadenopathy:    He has no cervical adenopathy.  Neurological: He has normal strength.  Awake, responsive, mild confusion    ED Course  Procedures (including critical care time) Labs Review Labs Reviewed - No data to display  Imaging Review No results found.   EKG Interpretation None      Date: 09/252015  Rate: 59  Rhythm: normal sinus rhythm  QRS Axis: normal  Intervals: PR prolonged  ST/T Wave abnormalities: normal  Conduction Disutrbances:none  Narrative Interpretation: Sinus rhythm with prolonged PR interval  Old EKG Reviewed: changes noted--slight increase in PR interval  Patient discussed with and seen by Dr. Juleen China. MDM   Final diagnoses:  None    Unwitnessed fall.  No apparent injury.    Jimmye Norman, NP 07/02/14 [phone]
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
## 2014-07-01 NOTE — ED Notes (Signed)
Pt unable to void at this time. 

## 2014-07-02 NOTE — ED Notes (Signed)
Patient is alert and oriented x3 with dementia.  PTAR was given DC instructions and follow up visit instructions.   He was DC via stretcher to nursing home.  V/S stable.  He was not showing any signs of distress on DC

## 2014-07-02 NOTE — Discharge Instructions (Signed)
Jacob York may return to his nursing facility. There was no apparent injury from his unwitnessed fall.

## 2014-07-07 NOTE — ED Provider Notes (Signed)
Medical screening examination/treatment/procedure(s) were conducted as a shared visit with non-physician practitioner(s) and myself.  I personally evaluated the patient during the encounter.   EKG Interpretation   Date/Time:  Friday July 01 2014 22:05:57 EDT Ventricular Rate:  59 PR Interval:  232 QRS Duration: 82 QT Interval:  430 QTC Calculation: 426 R Axis:   17 Text Interpretation:  Sinus rhythm Prolonged PR interval Borderline low  voltage, extremity leads Confirmed by Juleen ChinaKOHUT  MD, Myiah Petkus ([id number]) on  07/02/2014 12:45:49 AM     Patient sent for evaluation after a fall. This seems to be more from protocol than driven by specific patient complaint or staff concern otherwise. He has a history dementia. I feel is reliable enough to tell me if he is hurting though. He denies any pain. He has no external signs of trauma on my exam. No midline spinal tenderness. Aside from his mental status is exam seems to be pretty nonfocal. He is moving all his extremities without any apparent discomfort. No blood thinners aside from aspirin. Have a very low suspicion for serious head injury or other serious traumatic injury. Achilles stable for discharge.  Jacob RazorStephen Chelbi Herber, MD 07/07/14 1247

## 2014-07-08 ENCOUNTER — Telehealth (INDEPENDENT_AMBULATORY_CARE_PROVIDER_SITE_OTHER): Payer: Self-pay

## 2014-07-08 NOTE — Telephone Encounter (Signed)
[facility] calling for verbal order from Dr. Michaell CowingGross for OT.  Pt is at nursing facility. Please advise.

## 2014-07-08 NOTE — Telephone Encounter (Signed)
Make it so

## 2014-07-13 NOTE — Telephone Encounter (Signed)
LM with [facility] and [facility], RN voice mail.  OT verbal order rec'd from Dr. Michaell CowingGross.

## 2014-09-08 ENCOUNTER — Emergency Department (HOSPITAL_COMMUNITY): Payer: Medicare Other

## 2014-09-08 ENCOUNTER — Encounter (HOSPITAL_COMMUNITY): Payer: Self-pay | Admitting: Emergency Medicine

## 2014-09-08 ENCOUNTER — Emergency Department (HOSPITAL_COMMUNITY)
Admission: EM | Admit: 2014-09-08 | Discharge: 2014-09-08 | Disposition: A | Discharge status: Home / Self Care | Payer: Medicare Other | Attending: Emergency Medicine | Admitting: Emergency Medicine

## 2014-09-08 DIAGNOSIS — R1032 Left lower quadrant pain: Secondary | ICD-10-CM

## 2014-09-08 DIAGNOSIS — F0390 Unspecified dementia without behavioral disturbance: Secondary | ICD-10-CM | POA: Diagnosis not present

## 2014-09-08 DIAGNOSIS — Z79899 Other long term (current) drug therapy: Secondary | ICD-10-CM | POA: Diagnosis not present

## 2014-09-08 DIAGNOSIS — Z7982 Long term (current) use of aspirin: Secondary | ICD-10-CM | POA: Diagnosis not present

## 2014-09-08 DIAGNOSIS — S3662XA Contusion of rectum, initial encounter: Secondary | ICD-10-CM | POA: Insufficient documentation

## 2014-09-08 DIAGNOSIS — Y9289 Other specified places as the place of occurrence of the external cause: Secondary | ICD-10-CM | POA: Diagnosis not present

## 2014-09-08 DIAGNOSIS — Z8673 Personal history of transient ischemic attack (TIA), and cerebral infarction without residual deficits: Secondary | ICD-10-CM | POA: Insufficient documentation

## 2014-09-08 DIAGNOSIS — X58XXXA Exposure to other specified factors, initial encounter: Secondary | ICD-10-CM | POA: Insufficient documentation

## 2014-09-08 DIAGNOSIS — Y998 Other external cause status: Secondary | ICD-10-CM | POA: Diagnosis not present

## 2014-09-08 DIAGNOSIS — S301XXA Contusion of abdominal wall, initial encounter: Secondary | ICD-10-CM

## 2014-09-08 DIAGNOSIS — I10 Essential (primary) hypertension: Secondary | ICD-10-CM | POA: Diagnosis not present

## 2014-09-08 DIAGNOSIS — R109 Unspecified abdominal pain: Secondary | ICD-10-CM | POA: Diagnosis present

## 2014-09-08 DIAGNOSIS — Y9389 Activity, other specified: Secondary | ICD-10-CM | POA: Insufficient documentation

## 2014-09-08 LAB — COMPREHENSIVE METABOLIC PANEL
ALK PHOS: 76 U/L (ref 39–117)
ALT: 12 U/L (ref 0–53)
AST: 16 U/L (ref 0–37)
Albumin: 3.6 g/dL (ref 3.5–5.2)
Anion gap: 17 — ABNORMAL HIGH (ref 5–15)
BILIRUBIN TOTAL: 0.3 mg/dL (ref 0.3–1.2)
BUN: 25 mg/dL — ABNORMAL HIGH (ref 6–23)
CO2: 22 mEq/L (ref 19–32)
CREATININE: 1.32 mg/dL (ref 0.50–1.35)
Calcium: 9.7 mg/dL (ref 8.4–10.5)
Chloride: 99 mEq/L (ref 96–112)
GFR calc non Af Amer: 51 mL/min — ABNORMAL LOW (ref >90)
GFR, EST AFRICAN AMERICAN: 59 mL/min — AB (ref >90)
Glucose, Bld: 96 mg/dL (ref 70–99)
Potassium: 4.1 mEq/L (ref 3.7–5.3)
SODIUM: 138 meq/L (ref 137–147)
Total Protein: 6.8 g/dL (ref 6.0–8.3)

## 2014-09-08 LAB — CBC WITH DIFFERENTIAL/PLATELET
BASOS PCT: 1 % (ref 0–1)
Basophils Absolute: 0.1 10*3/uL (ref 0.0–0.1)
EOS ABS: 0.2 10*3/uL (ref 0.0–0.7)
EOS PCT: 3 % (ref 0–5)
HEMATOCRIT: 41.3 % (ref 39.0–52.0)
Hemoglobin: 13.5 g/dL (ref 13.0–17.0)
LYMPHS PCT: 25 % (ref 12–46)
Lymphs Abs: 1.6 10*3/uL (ref 0.7–4.0)
MCH: 28.8 pg (ref 26.0–34.0)
MCHC: 32.7 g/dL (ref 30.0–36.0)
MCV: 88.2 fL (ref 78.0–100.0)
MONO ABS: 0.4 10*3/uL (ref 0.1–1.0)
Monocytes Relative: 6 % (ref 3–12)
Neutro Abs: 4.2 10*3/uL (ref 1.7–7.7)
Neutrophils Relative %: 65 % (ref 43–77)
Platelets: 267 10*3/uL (ref 150–400)
RBC: 4.68 MIL/uL (ref 4.22–5.81)
RDW: 12.5 % (ref 11.5–15.5)
WBC: 6.4 10*3/uL (ref 4.0–10.5)

## 2014-09-08 LAB — URINALYSIS, ROUTINE W REFLEX MICROSCOPIC
BILIRUBIN URINE: NEGATIVE
Glucose, UA: NEGATIVE mg/dL
HGB URINE DIPSTICK: NEGATIVE
Ketones, ur: NEGATIVE mg/dL
Leukocytes, UA: NEGATIVE
Nitrite: NEGATIVE
PROTEIN: NEGATIVE mg/dL
Specific Gravity, Urine: 1.019 (ref 1.005–1.030)
UROBILINOGEN UA: 1 mg/dL (ref 0.0–1.0)
pH: 8.5 — ABNORMAL HIGH (ref 5.0–8.0)

## 2014-09-08 LAB — LIPASE, BLOOD: Lipase: 45 U/L (ref 11–59)

## 2014-09-08 MED ORDER — IOHEXOL 300 MG/ML  SOLN: 50.0000 mL | Freq: Once | INTRAMUSCULAR | Status: DC | PRN

## 2014-09-08 MED ORDER — SODIUM CHLORIDE 0.9 % IV SOLN
1000.0000 mL | Freq: Once | INTRAVENOUS | Status: AC
  Administered 2014-09-08: 1000 mL via INTRAVENOUS

## 2014-09-08 NOTE — ED Notes (Signed)
PTAR at bedside to transport pt at this time. 
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
## 2014-09-08 NOTE — Discharge Instructions (Signed)
Today's evaluation has been largely reassuring.  However, there is evidence for a hematoma, or bruise/blood collection in one of your abdominal muscles. These typically take some time to resolve, but do not typically progress to dangerous issues. Please be sure to monitor your condition carefully, and follow up with your physician for appropriate ongoing care.  Return here for concerning changes in your condition.

## 2014-09-08 NOTE — ED Notes (Signed)
[facility]

## 2014-09-08 NOTE — ED Notes (Signed)
Jacob York and I tried to do an In and Out cath but was unsuccessful.

## 2014-09-08 NOTE — Progress Notes (Signed)
CSW went to speak with Pt. However, he was not in room. Per note, Pt has a history of dementia and [facility].  CSW will check back in with Pt to assess needs.  Trish MageBrittney Hibo Blasdell, LCSWA [phone] ED CSW 09/08/2014 8:37 PM

## 2014-09-08 NOTE — ED Notes (Addendum)
Per ems [facility]. Per staff pt co abdominal pain and has no urine output today, also pt was more agitated than normal. Pt hx dementia, mentally at baseline with some confusion. CBG 125 pre ems. BP 122/78, HR 68, RR 16. Pt is ambulatory with assistance,

## 2014-09-08 NOTE — ED Provider Notes (Signed)
CSN: [id number]     Arrival date & time 09/08/14  1536 History   First MD Initiated Contact with Patient 09/08/14 1539     Chief Complaint  Patient presents with  . Abdominal Pain     HPI  Patient presents for his nursing facility with concerns of abdominal pain. Patient has a history of dementia, which limits the history of present illness.  Level V caveat. Per report the patient has had increasing abdominal pain today, with decreased urine output. Patient's baseline, diminished mental status has not changed.   Past Medical History  Diagnosis Date  . Hypertension   . Stroke   . Dementia    Past Surgical History  Procedure Laterality Date  . Inguinal hernia repair N/A 06/20/2014    Procedure: LAPAROSCOPIC BILATERAL INGUINAL HERNIA REPAIR, LEFT FEMORAL AND INCARCERATED SUPRA UMBILICAL HERNIA REPAIR WITH MESH;  Surgeon: Karie Soda, MD;  Location: [facility];  Service: General;  Laterality: N/A;  . Insertion of mesh N/A 06/20/2014    Procedure: INSERTION OF MESH;  Surgeon: Karie Soda, MD;  Location: [facility];  Service: General;  Laterality: N/A;   No family history on file. History  Substance Use Topics  . Smoking status: Never Smoker   . Smokeless tobacco: Never Used  . Alcohol Use: No    Review of Systems  Unable to perform ROS: Dementia      Allergies  Review of patient's allergies indicates no known allergies.  Home Medications   Prior to Admission medications   Medication Sig Start Date End Date Taking? Authorizing Provider  acetaminophen (TYLENOL) 500 MG tablet Take 500 mg by mouth every 4 (four) hours as needed for mild pain or moderate pain (pain).     Historical Provider, MD  alum & mag hydroxide-simeth (MAALOX/MYLANTA) 200-200-20 MG/5ML suspension Take 30 mLs by mouth 4 (four) times daily as needed for indigestion or heartburn.     Historical Provider, MD  aspirin 81 MG chewable tablet Chew 81 mg by mouth daily.    Historical Provider, MD  cholecalciferol  (VITAMIN D) 1000 UNITS tablet Take 1,000 Units by mouth daily.    Historical Provider, MD  divalproex (DEPAKOTE SPRINKLE) 125 MG capsule Take 125 mg by mouth 2 (two) times daily.    Historical Provider, MD  donepezil (ARICEPT) 10 MG tablet Take 10 mg by mouth at bedtime.    Historical Provider, MD  guaifenesin (ROBITUSSIN) 100 MG/5ML syrup Take 200 mg by mouth 3 (three) times daily as needed for cough.    Historical Provider, MD  haloperidol (HALDOL) 0.5 MG tablet Take 0.5 mg by mouth every 4 (four) hours as needed for agitation.    Historical Provider, MD  lisinopril (PRINIVIL,ZESTRIL) 5 MG tablet Take 5 mg by mouth every morning.     Historical Provider, MD  loperamide (IMODIUM) 2 MG capsule Take by mouth as needed for diarrhea or loose stools.    Historical Provider, MD  magnesium hydroxide (MILK OF MAGNESIA) 400 MG/5ML suspension Take 30 mLs by mouth daily as needed for mild constipation.     Historical Provider, MD  metoprolol succinate (TOPROL-XL) 25 MG 24 hr tablet Take 25 mg by mouth every morning.     Historical Provider, MD  mirtazapine (REMERON) 30 MG tablet Take 30 mg by mouth at bedtime.    Historical Provider, MD  neomycin-bacitracin-polymyxin (NEOSPORIN) [phone] ointment Apply 1 application topically daily as needed (for skin tears,abrasions,minor irritation).     Historical Provider, MD  traMADol (ULTRAM) 50 MG  tablet Take 100 mg by mouth every 6 (six) hours as needed for moderate pain or severe pain (pain). 06/20/14   Karie SodaSteven Gross, MD   BP 126/72 mmHg  Pulse 84  Temp(Src) 97.6 F (36.4 C) (Oral)  Resp 18  SpO2 100% Physical Exam  Constitutional: He appears well-developed. No distress.  HENT:  Head: Normocephalic and atraumatic.  Eyes: Conjunctivae and EOM are normal.  Cardiovascular: Normal rate and regular rhythm.   Pulmonary/Chest: Effort normal. No stridor. No respiratory distress.  Abdominal: He exhibits no distension. There is no hepatosplenomegaly. There is  tenderness in the suprapubic area and left lower quadrant. There is guarding. There is no rigidity.  Musculoskeletal: He exhibits no edema.  Neurological: He is alert.  Patient awake and alert, but repetitive, tangential, moving all extremity spontaneously, interacting appropriately, with clear speech, no facial asymmetry  Skin: Skin is warm and dry.  Psychiatric: Cognition and memory are impaired.  Nursing note and vitals reviewed.   ED Course  Procedures (including critical care time) Labs Review Labs Reviewed  COMPREHENSIVE METABOLIC PANEL - Abnormal; Notable for the following:    BUN 25 (*)    GFR calc non Af Amer 51 (*)    GFR calc Af Amer 59 (*)    Anion gap 17 (*)    All other components within normal limits  URINALYSIS, ROUTINE W REFLEX MICROSCOPIC - Abnormal; Notable for the following:    APPearance CLOUDY (*)    pH 8.5 (*)    All other components within normal limits  CBC WITH DIFFERENTIAL  LIPASE, BLOOD    Imaging Review Ct Abdomen Pelvis Wo Contrast  09/08/2014   CLINICAL DATA:  Abdominal pain, decreased urine output, agitated  EXAM: CT ABDOMEN AND PELVIS WITHOUT CONTRAST  TECHNIQUE: Multidetector CT imaging of the abdomen and pelvis was performed following the standard protocol without IV contrast.  COMPARISON:  None.  FINDINGS: The lung bases are clear.  There are punctate nonobstructing bilateral renal calculi. No obstructive uropathy. No perinephric stranding is seen. The kidneys are symmetric in size without evidence for exophytic mass. There is a small right lateral bladder diverticulum measuring 13 x 25 mm. There is a small amount of high attenuation material anterior to the bladder in the prevesical space with surrounding stranding concerning for a small amount of hemorrhage.  There is a 17 mm hypodense, fluid attenuating right hepatic mass most consistent with a cyst. There are other smaller hypodensities scattered within the liver which are too small to  characterize, but likely represent small cysts or hamartomas. The gallbladder is unremarkable. The spleen demonstrates no focal abnormality. The adrenal glands and pancreas are normal.  The unopacified stomach, duodenum, small intestine and large intestine are unremarkable, but evaluation is limited by lack of oral contrast. There is a normal caliber appendix in the right lower quadrant without periappendiceal inflammatory changes.There is a small fat containing ventral supraumbilical hernia with fat stranding likely reflecting fat necrosis. There is no pneumoperitoneum, pneumatosis, or portal venous gas. There is no abdominal or pelvic free fluid. There is no lymphadenopathy.  The abdominal aorta is normal in caliber with atherosclerosis.  There is degenerative disc disease at L4-5 and L5-S1. There is bilateral facet arthropathy at L4-5 and L5-S1.  There is expansion of the inferior left rectus abdominis muscle most concerning for an intramuscular hematoma.  IMPRESSION: 1. Mild expansion of the inferior left rectus abdominis muscle most concerning for an intramuscular hematoma. There is a small amount of hemorrhage extending  into the prevesical space with fat stranding. 2.There is a small fat containing ventral supraumbilical hernia with fat stranding likely reflecting fat necrosis.   Electronically Signed   By: Elige KoHetal  Patel   On: 09/08/2014 18:47     EKG Interpretation   Date/Time:  Thursday September 08 2014 15:47:40 EST Ventricular Rate:  65 PR Interval:    QRS Duration: 83 QT Interval:  482 QTC Calculation: 501 R Axis:   41 Text Interpretation:  Pacemaker spikes or artifacts Atrial fibrillation  Ventricular premature complex Aberrant conduction of SV complex(es)  Nonspecific T abnrm, anterolateral leads Baseline wander in lead(s) V2 V3  Sinus rhythm Premature ventricular complexes Artifact Abnormal ekg  Confirmed by Gerhard MunchLOCKWOOD, Jayvon Mounger  MD [phone]([id number]) on 09/08/2014 4:01:08 PM     On several repeat  exams, the patient was in no distress, with no ongoing complaints. Patient was able to urinate, spontaneously.As discussed, it is important that you follow up as soon as possible with your physician for continued management of your condition.  If you develop any new, or concerning changes in your condition, please return to the emergency department immediately.  MDM  Patient presents from her nursing facility with staff concerns of abdominal pain.  Here the patient is afebrile, hemodynamically stable, in no distress.  Patient has minimal complaint, but with palpation of the belly he does resist. Patient's abdominal CT demonstrates rectus sheath hematoma, without other acute findings. With no evidence for distress, patient was discharged to his nursing facility with a course of analgesics, to follow-up with primary care.     Gerhard Munchobert Jakayla Schweppe, MD 09/08/14 2021

## 2014-09-08 NOTE — ED Notes (Signed)
PTAR called at  this time. 

## 2014-09-08 NOTE — ED Notes (Signed)
Writer met a lot of resistance from the prostate. I and O was unsuccessful

## 2014-09-08 NOTE — ED Notes (Signed)
Pt unable to give sample for UA while standing.

## 2014-09-08 NOTE — ED Notes (Signed)
Patient transported to CT 

## 2014-09-21 ENCOUNTER — Encounter (HOSPITAL_COMMUNITY): Payer: Self-pay | Admitting: Vascular Surgery

## 2014-09-21 ENCOUNTER — Emergency Department (HOSPITAL_COMMUNITY): Payer: Medicare Other

## 2014-09-21 ENCOUNTER — Inpatient Hospital Stay (HOSPITAL_COMMUNITY)
Admission: EM | Admit: 2014-09-21 | Discharge: 2014-09-27 | DRG: 310 | Disposition: A | Discharge status: Home / Self Care | Payer: Medicare Other | Attending: Cardiovascular Disease | Admitting: Cardiovascular Disease

## 2014-09-21 DIAGNOSIS — Z7982 Long term (current) use of aspirin: Secondary | ICD-10-CM | POA: Diagnosis not present

## 2014-09-21 DIAGNOSIS — Z8673 Personal history of transient ischemic attack (TIA), and cerebral infarction without residual deficits: Secondary | ICD-10-CM | POA: Diagnosis not present

## 2014-09-21 DIAGNOSIS — R55 Syncope and collapse: Secondary | ICD-10-CM | POA: Diagnosis not present

## 2014-09-21 DIAGNOSIS — I493 Ventricular premature depolarization: Secondary | ICD-10-CM | POA: Diagnosis present

## 2014-09-21 DIAGNOSIS — I10 Essential (primary) hypertension: Secondary | ICD-10-CM | POA: Diagnosis present

## 2014-09-21 DIAGNOSIS — F0390 Unspecified dementia without behavioral disturbance: Secondary | ICD-10-CM

## 2014-09-21 DIAGNOSIS — N183 Chronic kidney disease, stage 3 unspecified: Secondary | ICD-10-CM | POA: Insufficient documentation

## 2014-09-21 DIAGNOSIS — R339 Retention of urine, unspecified: Secondary | ICD-10-CM | POA: Diagnosis not present

## 2014-09-21 DIAGNOSIS — I129 Hypertensive chronic kidney disease with stage 1 through stage 4 chronic kidney disease, or unspecified chronic kidney disease: Secondary | ICD-10-CM | POA: Diagnosis present

## 2014-09-21 DIAGNOSIS — R008 Other abnormalities of heart beat: Secondary | ICD-10-CM | POA: Diagnosis present

## 2014-09-21 DIAGNOSIS — D72829 Elevated white blood cell count, unspecified: Secondary | ICD-10-CM | POA: Diagnosis present

## 2014-09-21 DIAGNOSIS — Z87898 Personal history of other specified conditions: Secondary | ICD-10-CM | POA: Diagnosis not present

## 2014-09-21 DIAGNOSIS — Z79899 Other long term (current) drug therapy: Secondary | ICD-10-CM | POA: Diagnosis not present

## 2014-09-21 DIAGNOSIS — I499 Cardiac arrhythmia, unspecified: Secondary | ICD-10-CM

## 2014-09-21 DIAGNOSIS — N2889 Other specified disorders of kidney and ureter: Secondary | ICD-10-CM | POA: Insufficient documentation

## 2014-09-21 DIAGNOSIS — R001 Bradycardia, unspecified: Principal | ICD-10-CM

## 2014-09-21 DIAGNOSIS — F03918 Unspecified dementia, unspecified severity, with other behavioral disturbance: Secondary | ICD-10-CM | POA: Diagnosis present

## 2014-09-21 DIAGNOSIS — I498 Other specified cardiac arrhythmias: Secondary | ICD-10-CM | POA: Diagnosis present

## 2014-09-21 DIAGNOSIS — F0391 Unspecified dementia with behavioral disturbance: Secondary | ICD-10-CM | POA: Diagnosis present

## 2014-09-21 HISTORY — DX: Retention of urine, unspecified: R33.9

## 2014-09-21 LAB — COMPREHENSIVE METABOLIC PANEL
ALT: 9 U/L (ref 0–53)
AST: 15 U/L (ref 0–37)
Albumin: 3.4 g/dL — ABNORMAL LOW (ref 3.5–5.2)
Alkaline Phosphatase: 70 U/L (ref 39–117)
Anion gap: 16 — ABNORMAL HIGH (ref 5–15)
BUN: 20 mg/dL (ref 6–23)
CO2: 22 mEq/L (ref 19–32)
Calcium: 9.5 mg/dL (ref 8.4–10.5)
Chloride: 101 mEq/L (ref 96–112)
Creatinine, Ser: 1.32 mg/dL (ref 0.50–1.35)
GFR calc non Af Amer: 51 mL/min — ABNORMAL LOW (ref >90)
GFR, EST AFRICAN AMERICAN: 59 mL/min — AB (ref >90)
Glucose, Bld: 92 mg/dL (ref 70–99)
Potassium: 3.9 mEq/L (ref 3.7–5.3)
SODIUM: 139 meq/L (ref 137–147)
TOTAL PROTEIN: 6.3 g/dL (ref 6.0–8.3)
Total Bilirubin: 0.4 mg/dL (ref 0.3–1.2)

## 2014-09-21 LAB — CBC WITH DIFFERENTIAL/PLATELET
Basophils Absolute: 0 10*3/uL (ref 0.0–0.1)
Basophils Relative: 0 % (ref 0–1)
EOS ABS: 0.1 10*3/uL (ref 0.0–0.7)
EOS PCT: 1 % (ref 0–5)
HEMATOCRIT: 40.6 % (ref 39.0–52.0)
Hemoglobin: 13.7 g/dL (ref 13.0–17.0)
LYMPHS ABS: 1 10*3/uL (ref 0.7–4.0)
Lymphocytes Relative: 9 % — ABNORMAL LOW (ref 12–46)
MCH: 29.4 pg (ref 26.0–34.0)
MCHC: 33.7 g/dL (ref 30.0–36.0)
MCV: 87.1 fL (ref 78.0–100.0)
Monocytes Absolute: 0.7 10*3/uL (ref 0.1–1.0)
Monocytes Relative: 7 % (ref 3–12)
Neutro Abs: 8.9 10*3/uL — ABNORMAL HIGH (ref 1.7–7.7)
Neutrophils Relative %: 83 % — ABNORMAL HIGH (ref 43–77)
PLATELETS: 184 10*3/uL (ref 150–400)
RBC: 4.66 MIL/uL (ref 4.22–5.81)
RDW: 12.9 % (ref 11.5–15.5)
WBC: 10.8 10*3/uL — ABNORMAL HIGH (ref 4.0–10.5)

## 2014-09-21 LAB — MAGNESIUM: Magnesium: 1.9 mg/dL (ref 1.5–2.5)

## 2014-09-21 LAB — PHOSPHORUS: Phosphorus: 4.4 mg/dL (ref 2.3–4.6)

## 2014-09-21 LAB — TROPONIN I
Troponin I: <0.3 ng/mL (ref <0.30)
Troponin I: <0.3 ng/mL (ref <0.30)

## 2014-09-21 LAB — TSH: TSH: 1.78 u[IU]/mL (ref 0.350–4.500)

## 2014-09-21 MED ORDER — SODIUM CHLORIDE 0.9 % IJ SOLN: 3.0000 mL | INTRAMUSCULAR | Status: DC | PRN

## 2014-09-21 MED ORDER — ASPIRIN 300 MG RE SUPP: 300.0000 mg | RECTAL | Status: AC

## 2014-09-21 MED ORDER — SODIUM CHLORIDE 0.9 % IJ SOLN
3.0000 mL | Freq: Two times a day (BID) | INTRAMUSCULAR | Status: DC
  Administered 2014-09-23 – 2014-09-27 (×5): 3 mL via INTRAVENOUS

## 2014-09-21 MED ORDER — MIRTAZAPINE 30 MG PO TABS
30.0000 mg | ORAL_TABLET | Freq: Every day | ORAL | Status: DC
  Administered 2014-09-21 – 2014-09-26 (×6): 30 mg via ORAL
  Filled 2014-09-21 (×7): qty 1

## 2014-09-21 MED ORDER — ACETAMINOPHEN 325 MG PO TABS: 650.0000 mg | ORAL_TABLET | ORAL | Status: DC | PRN

## 2014-09-21 MED ORDER — ASPIRIN EC 81 MG PO TBEC
81.0000 mg | DELAYED_RELEASE_TABLET | Freq: Every day | ORAL | Status: DC
  Administered 2014-09-22 – 2014-09-26 (×5): 81 mg via ORAL
  Filled 2014-09-21 (×6): qty 1

## 2014-09-21 MED ORDER — ENOXAPARIN SODIUM 40 MG/0.4ML ~~LOC~~ SOLN
40.0000 mg | SUBCUTANEOUS | Status: DC
  Administered 2014-09-21 – 2014-09-26 (×5): 40 mg via SUBCUTANEOUS
  Filled 2014-09-21 (×7): qty 0.4

## 2014-09-21 MED ORDER — HEPARIN SODIUM (PORCINE) 5000 UNIT/ML IJ SOLN: 5000.0000 [IU] | Freq: Three times a day (TID) | INTRAMUSCULAR | Status: DC

## 2014-09-21 MED ORDER — NITROGLYCERIN 0.4 MG SL SUBL: 0.4000 mg | SUBLINGUAL_TABLET | SUBLINGUAL | Status: DC | PRN

## 2014-09-21 MED ORDER — GUAIFENESIN 100 MG/5ML PO SYRP
200.0000 mg | ORAL_SOLUTION | Freq: Three times a day (TID) | ORAL | Status: DC | PRN
  Filled 2014-09-21: qty 10

## 2014-09-21 MED ORDER — ASPIRIN 81 MG PO CHEW
324.0000 mg | CHEWABLE_TABLET | ORAL | Status: AC
  Administered 2014-09-21: 324 mg via ORAL

## 2014-09-21 MED ORDER — ONDANSETRON HCL 4 MG/2ML IJ SOLN: 4.0000 mg | Freq: Four times a day (QID) | INTRAMUSCULAR | Status: DC | PRN

## 2014-09-21 MED ORDER — ONDANSETRON HCL 4 MG/2ML IJ SOLN
4.0000 mg | Freq: Four times a day (QID) | INTRAMUSCULAR | Status: DC | PRN
Start: 2014-09-21 — End: 2014-09-21

## 2014-09-21 MED ORDER — ALUM & MAG HYDROXIDE-SIMETH 200-200-20 MG/5ML PO SUSP: 30.0000 mL | Freq: Four times a day (QID) | ORAL | Status: DC | PRN

## 2014-09-21 MED ORDER — HALOPERIDOL 0.5 MG PO TABS
0.5000 mg | ORAL_TABLET | ORAL | Status: DC | PRN
  Administered 2014-09-23: 0.5 mg via ORAL
  Filled 2014-09-21 (×3): qty 1

## 2014-09-21 MED ORDER — VITAMIN D3 25 MCG (1000 UNIT) PO TABS
1000.0000 [IU] | ORAL_TABLET | Freq: Every day | ORAL | Status: DC
  Administered 2014-09-21 – 2014-09-27 (×7): 1000 [IU] via ORAL
  Filled 2014-09-21 (×7): qty 1

## 2014-09-21 MED ORDER — SODIUM CHLORIDE 0.9 % IV SOLN
INTRAVENOUS | Status: DC
  Administered 2014-09-21 – 2014-09-22 (×2): via INTRAVENOUS

## 2014-09-21 MED ORDER — DIVALPROEX SODIUM 125 MG PO CPSP
125.0000 mg | ORAL_CAPSULE | Freq: Two times a day (BID) | ORAL | Status: DC
  Administered 2014-09-21 – 2014-09-27 (×12): 125 mg via ORAL
  Filled 2014-09-21 (×14): qty 1

## 2014-09-21 MED ORDER — TRAMADOL HCL 50 MG PO TABS: 100.0000 mg | ORAL_TABLET | Freq: Four times a day (QID) | ORAL | Status: DC | PRN

## 2014-09-21 MED ORDER — ONDANSETRON HCL 4 MG PO TABS: 4.0000 mg | ORAL_TABLET | Freq: Four times a day (QID) | ORAL | Status: DC | PRN

## 2014-09-21 MED ORDER — SODIUM CHLORIDE 0.9 % IJ SOLN
3.0000 mL | Freq: Two times a day (BID) | INTRAMUSCULAR | Status: DC
  Administered 2014-09-23 – 2014-09-27 (×6): 3 mL via INTRAVENOUS

## 2014-09-21 MED ORDER — ASPIRIN 81 MG PO CHEW
81.0000 mg | CHEWABLE_TABLET | Freq: Every day | ORAL | Status: DC
  Administered 2014-09-27: 81 mg via ORAL
  Filled 2014-09-21: qty 1

## 2014-09-21 MED ORDER — ACETAMINOPHEN 500 MG PO TABS
500.0000 mg | ORAL_TABLET | ORAL | Status: DC | PRN
Start: 2014-09-21 — End: 2014-09-21

## 2014-09-21 MED ORDER — SODIUM CHLORIDE 0.9 % IV SOLN: 250.0000 mL | INTRAVENOUS | Status: DC | PRN

## 2014-09-21 NOTE — H&P (Signed)
Requesting Physician: ED  HPI: This is a 78 y.o. male with a past medical history significant for dementia. He is a SNF resident. No significant PMH available in Epic, I believe his PCP is in [place]. He apparently was found unresponsive in his wheelchair. He vomited once. EMS was called. EKG shows NSR with bigeminy. The pt is unable to give any history. He is unable to tell me the day, month, or year. He says he lives at home with his wife. His only complaint to me is that "I can't pee".   PMHx:  Past Medical History  Diagnosis Date  . Hypertension   . Stroke   . Dementia     Past Surgical History  Procedure Laterality Date  . Inguinal hernia repair N/A 06/20/2014    Procedure: LAPAROSCOPIC BILATERAL INGUINAL HERNIA REPAIR, LEFT FEMORAL AND INCARCERATED SUPRA UMBILICAL HERNIA REPAIR WITH MESH;  Surgeon: Karie Soda, MD;  Location: [facility];  Service: General;  Laterality: N/A;  . Insertion of mesh N/A 06/20/2014    Procedure: INSERTION OF MESH;  Surgeon: Karie Soda, MD;  Location: [facility];  Service: General;  Laterality: N/A;    SOCHx:  reports that he has never smoked. He has never used smokeless tobacco. He reports that he does not drink alcohol or use illicit drugs.SNF resident  FAMHx: Unable to obtain FMHx secondary to dementia. There are no helpful records in EPIC  ALLERGIES: No Known Allergies  ROS: Review of systems not obtained due to patient factors.  HOME MEDICATIONS: Prior to Admission medications   Medication Sig Start Date End Date Taking? Authorizing Provider  acetaminophen (TYLENOL) 500 MG tablet Take 500 mg by mouth every 4 (four) hours as needed for mild pain or moderate pain (pain).    Yes Historical Provider, MD  alum & mag hydroxide-simeth (MAALOX/MYLANTA) 200-200-20 MG/5ML suspension Take 30 mLs by mouth 4 (four) times daily as needed for indigestion or heartburn.    Yes Historical Provider, MD  aspirin 81 MG chewable tablet Chew 81 mg by mouth daily.    Yes Historical Provider, MD  cholecalciferol (VITAMIN D) 1000 UNITS tablet Take 1,000 Units by mouth daily.   Yes Historical Provider, MD  divalproex (DEPAKOTE SPRINKLE) 125 MG capsule Take 125 mg by mouth 2 (two) times daily.   Yes Historical Provider, MD  donepezil (ARICEPT) 10 MG tablet Take 10 mg by mouth at bedtime.   Yes Historical Provider, MD  guaifenesin (ROBITUSSIN) 100 MG/5ML syrup Take 200 mg by mouth 3 (three) times daily as needed for cough.   Yes Historical Provider, MD  haloperidol (HALDOL) 0.5 MG tablet Take 0.5 mg by mouth every 4 (four) hours as needed for agitation.   Yes Historical Provider, MD  lisinopril (PRINIVIL,ZESTRIL) 5 MG tablet Take 5 mg by mouth every morning.    Yes Historical Provider, MD  loperamide (IMODIUM) 2 MG capsule Take by mouth as needed for diarrhea or loose stools.   Yes Historical Provider, MD  magnesium hydroxide (MILK OF MAGNESIA) 400 MG/5ML suspension Take 30 mLs by mouth daily as needed for mild constipation.    Yes Historical Provider, MD  metoprolol succinate (TOPROL-XL) 25 MG 24 hr tablet Take 25 mg by mouth every morning.    Yes Historical Provider, MD  mirtazapine (REMERON) 30 MG tablet Take 30 mg by mouth at bedtime.   Yes Historical Provider, MD  neomycin-bacitracin-polymyxin (NEOSPORIN) [phone] ointment Apply 1 application topically daily as needed (for skin tears,abrasions,minor irritation).  Yes Historical Provider, MD  traMADol (ULTRAM) 50 MG tablet Take 100 mg by mouth every 6 (six) hours as needed for moderate pain or severe pain (pain). 06/20/14  Yes Karie SodaSteven Gross, MD    HOSPITAL MEDICATIONS: I have reviewed the patient's current medications. Marland Kitchen. aspirin  324 mg Oral NOW   Or  . aspirin  300 mg Rectal NOW  . [START ON 09/22/2014] aspirin EC  81 mg Oral Daily  . heparin  5,000 Units Subcutaneous 3 times per day  . sodium chloride  3 mL Intravenous Q12H   VITALS: Blood pressure 107/59, pulse 46, temperature 97.8 F (36.6 C),  temperature source Oral, resp. rate 20, SpO2 99 %.  PHYSICAL EXAM: General appearance: alert, cooperative, no distress and dementia Neck: no carotid bruit and no JVD Lungs: clear to auscultation bilaterally Heart: regular rate and rhythm Abdomen: soft, non-tender; bowel sounds normal; no masses,  no organomegaly Extremities: extremities normal, atraumatic, no cyanosis or edema Pulses: 2+ and symmetric Skin: Skin color, texture, turgor normal. No rashes or lesions Neurologic: Grossly normal  LABS: Results for orders placed or performed during the hospital encounter of 09/21/14 (from the past 24 hour(s))  CBC with Differential     Status: Abnormal   Collection Time: 09/21/14  1:55 PM  Result Value Ref Range   WBC 10.8 (H) 4.0 - 10.5 K/uL   RBC 4.66 4.22 - 5.81 MIL/uL   Hemoglobin 13.7 13.0 - 17.0 g/dL   HCT 40.940.6 81.139.0 - 91.452.0 %   MCV 87.1 78.0 - 100.0 fL   MCH 29.4 26.0 - 34.0 pg   MCHC 33.7 30.0 - 36.0 g/dL   RDW 78.212.9 95.611.5 - 21.315.5 %   Platelets 184 150 - 400 K/uL   Neutrophils Relative % 83 (H) 43 - 77 %   Neutro Abs 8.9 (H) 1.7 - 7.7 K/uL   Lymphocytes Relative 9 (L) 12 - 46 %   Lymphs Abs 1.0 0.7 - 4.0 K/uL   Monocytes Relative 7 3 - 12 %   Monocytes Absolute 0.7 0.1 - 1.0 K/uL   Eosinophils Relative 1 0 - 5 %   Eosinophils Absolute 0.1 0.0 - 0.7 K/uL   Basophils Relative 0 0 - 1 %   Basophils Absolute 0.0 0.0 - 0.1 K/uL  Comprehensive metabolic panel     Status: Abnormal   Collection Time: 09/21/14  1:55 PM  Result Value Ref Range   Sodium 139 137 - 147 mEq/L   Potassium 3.9 3.7 - 5.3 mEq/L   Chloride 101 96 - 112 mEq/L   CO2 22 19 - 32 mEq/L   Glucose, Bld 92 70 - 99 mg/dL   BUN 20 6 - 23 mg/dL   Creatinine, Ser 0.861.32 0.50 - 1.35 mg/dL   Calcium 9.5 8.4 - 57.810.5 mg/dL   Total Protein 6.3 6.0 - 8.3 g/dL   Albumin 3.4 (L) 3.5 - 5.2 g/dL   AST 15 0 - 37 U/L   ALT 9 0 - 53 U/L   Alkaline Phosphatase 70 39 - 117 U/L   Total Bilirubin 0.4 0.3 - 1.2 mg/dL   GFR calc non Af  Amer 51 (L) >90 mL/min   GFR calc Af Amer 59 (L) >90 mL/min   Anion gap 16 (H) 5 - 15  Troponin I     Status: None   Collection Time: 09/21/14  1:55 PM  Result Value Ref Range   Troponin I <0.30 <0.30 ng/mL  Magnesium     Status:  None   Collection Time: 09/21/14  4:01 PM  Result Value Ref Range   Magnesium 1.9 1.5 - 2.5 mg/dL  Phosphorus     Status: None   Collection Time: 09/21/14  4:01 PM  Result Value Ref Range   Phosphorus 4.4 2.3 - 4.6 mg/dL  TSH     Status: None   Collection Time: 09/21/14  4:01 PM  Result Value Ref Range   TSH 1.780 0.350 - 4.500 uIU/mL    EKG: NSR, SB  IMAGING: Dg Chest 2 View  09/21/2014   CLINICAL DATA:  Syncope and dementia.  EXAM: CHEST  2 VIEW  COMPARISON:  06/20/2014  FINDINGS: Lungs are clear. Stable appearance of the heart and mediastinum. Atherosclerotic calcifications at the aortic arch. The trachea is midline. No acute bone abnormalities. No evidence for large pleural effusions.  IMPRESSION: No active cardiopulmonary disease.   Electronically Signed   By: Richarda OverlieAdam  Henn M.D.   On: 09/21/2014 15:35    IMPRESSION: Principal Problem:   Syncope Active Problems:   Dementia   Bigeminy   Bradycardia   Renal insufficiency-stage 3- unknown if this is chronic   Hypertension   RECOMMENDATION: Not clear if episode was from hypotension, mild dehydration, or bradycardia. Admit to monitor, stop Toprol, hold ACE, check echo, check TSH, gentle hydration. MD to see.   Time Spent Directly with Patient: 45 minutes  Abelino DerrickKILROY,LUKE K [phone] beeper 09/21/2014, 5:07 PM     Patient seen and examined. Agree with assessment and plan. Pleasantly demented 78 yo WM with  A h/o HTN, admitted from nursing home after an apparent syncopal spell with bradycardia and bigeminal rhythm. No chest pain. ECG upon arrival revealed SB at 51. He denies any chest pain. History is unreliable due to his dementia. He states that he live at home with wife and 2 children. Ages of  children are "regular age" and that he goes to "school, no work" every day.  No signs of CHF on exam . Occasional unifocal PVC's noted on telemetry. Will monitor othostatics. Check labs with Mg, TSH electrolytes. Schedule for 2 d echo. At present will hold BB and ACE-I. Monitor BP and telemetry.   Lennette Biharihomas A. Kelly, MD, [facility] 09/21/2014 5:27 PM

## 2014-09-21 NOTE — ED Provider Notes (Addendum)
CSN: [id number]     Arrival date & time 09/21/14  1239 History   First MD Initiated Contact with Patient 09/21/14 1243     Chief Complaint  Patient presents with  . Loss of Consciousness    Level V caveat: Dementia  HPI Patient presents from the nursing facility with questionable syncopal episode.  Patient was found unresponsive in his wheelchair was diaphoretic and pale.  Patient vomited once.  EMS arrived and found the patient in bigeminy.  Patient received 4 mg of Zofran.  He has no complaints at time of my valuation however the patient has dementia and therefore can provide limited information.  Medical she from the nursing facility demonstrates the patient is a full code and that he is on beta blockers.   Past Medical History  Diagnosis Date  . Hypertension   . Stroke   . Dementia    Past Surgical History  Procedure Laterality Date  . Inguinal hernia repair N/A 06/20/2014    Procedure: LAPAROSCOPIC BILATERAL INGUINAL HERNIA REPAIR, LEFT FEMORAL AND INCARCERATED SUPRA UMBILICAL HERNIA REPAIR WITH MESH;  Surgeon: Karie SodaSteven Gross, MD;  Location: [facility];  Service: General;  Laterality: N/A;  . Insertion of mesh N/A 06/20/2014    Procedure: INSERTION OF MESH;  Surgeon: Karie SodaSteven Gross, MD;  Location: [facility];  Service: General;  Laterality: N/A;   No family history on file. History  Substance Use Topics  . Smoking status: Never Smoker   . Smokeless tobacco: Never Used  . Alcohol Use: No    Review of Systems  Unable to perform ROS     Allergies  Review of patient's allergies indicates no known allergies.  Home Medications   Prior to Admission medications   Medication Sig Start Date End Date Taking? Authorizing Provider  acetaminophen (TYLENOL) 500 MG tablet Take 500 mg by mouth every 4 (four) hours as needed for mild pain or moderate pain (pain).    Yes Historical Provider, MD  alum & mag hydroxide-simeth (MAALOX/MYLANTA) 200-200-20 MG/5ML suspension Take 30 mLs by mouth 4  (four) times daily as needed for indigestion or heartburn.    Yes Historical Provider, MD  aspirin 81 MG chewable tablet Chew 81 mg by mouth daily.   Yes Historical Provider, MD  cholecalciferol (VITAMIN D) 1000 UNITS tablet Take 1,000 Units by mouth daily.   Yes Historical Provider, MD  divalproex (DEPAKOTE SPRINKLE) 125 MG capsule Take 125 mg by mouth 2 (two) times daily.   Yes Historical Provider, MD  donepezil (ARICEPT) 10 MG tablet Take 10 mg by mouth at bedtime.   Yes Historical Provider, MD  guaifenesin (ROBITUSSIN) 100 MG/5ML syrup Take 200 mg by mouth 3 (three) times daily as needed for cough.   Yes Historical Provider, MD  haloperidol (HALDOL) 0.5 MG tablet Take 0.5 mg by mouth every 4 (four) hours as needed for agitation.   Yes Historical Provider, MD  lisinopril (PRINIVIL,ZESTRIL) 5 MG tablet Take 5 mg by mouth every morning.    Yes Historical Provider, MD  loperamide (IMODIUM) 2 MG capsule Take by mouth as needed for diarrhea or loose stools.   Yes Historical Provider, MD  magnesium hydroxide (MILK OF MAGNESIA) 400 MG/5ML suspension Take 30 mLs by mouth daily as needed for mild constipation.    Yes Historical Provider, MD  metoprolol succinate (TOPROL-XL) 25 MG 24 hr tablet Take 25 mg by mouth every morning.    Yes Historical Provider, MD  mirtazapine (REMERON) 30 MG tablet Take 30 mg by mouth  at bedtime.   Yes Historical Provider, MD  neomycin-bacitracin-polymyxin (NEOSPORIN) [phone] ointment Apply 1 application topically daily as needed (for skin tears,abrasions,minor irritation).    Yes Historical Provider, MD  traMADol (ULTRAM) 50 MG tablet Take 100 mg by mouth every 6 (six) hours as needed for moderate pain or severe pain (pain). 06/20/14  Yes Karie SodaSteven Gross, MD   BP 94/59 mmHg  Pulse 69  Temp(Src) 97.8 F (36.6 C) (Oral)  Resp 21  SpO2 99% Physical Exam  Constitutional: He is oriented to person, place, and time. He appears well-developed and well-nourished.  HENT:  Head:  Normocephalic and atraumatic.  Eyes: EOM are normal.  Neck: Normal range of motion.  Cardiovascular: Normal rate, regular rhythm, normal heart sounds and intact distal pulses.   Pulmonary/Chest: Effort normal and breath sounds normal. No respiratory distress.  Abdominal: Soft. He exhibits no distension. There is no tenderness.  Musculoskeletal: Normal range of motion.  Neurological: He is alert and oriented to person, place, and time.  Skin: Skin is warm and dry.  Psychiatric: He has a normal mood and affect. Judgment normal.  Nursing note and vitals reviewed.   ED Course  Procedures (including critical care time) Labs Review Labs Reviewed  CBC WITH DIFFERENTIAL - Abnormal; Notable for the following:    WBC 10.8 (*)    Neutrophils Relative % 83 (*)    Neutro Abs 8.9 (*)    Lymphocytes Relative 9 (*)    All other components within normal limits  COMPREHENSIVE METABOLIC PANEL - Abnormal; Notable for the following:    Albumin 3.4 (*)    GFR calc non Af Amer 51 (*)    GFR calc Af Amer 59 (*)    Anion gap 16 (*)    All other components within normal limits  TROPONIN I  URINALYSIS, ROUTINE W REFLEX MICROSCOPIC  MAGNESIUM  PHOSPHORUS  TSH    Imaging Review Dg Chest 2 View  09/21/2014   CLINICAL DATA:  Syncope and dementia.  EXAM: CHEST  2 VIEW  COMPARISON:  06/20/2014  FINDINGS: Lungs are clear. Stable appearance of the heart and mediastinum. Atherosclerotic calcifications at the aortic arch. The trachea is midline. No acute bone abnormalities. No evidence for large pleural effusions.  IMPRESSION: No active cardiopulmonary disease.   Electronically Signed   By: Richarda OverlieAdam  Henn M.D.   On: 09/21/2014 15:35     EKG Interpretation   Date/Time:  Wednesday September 21 2014 12:46:06 EST Ventricular Rate:  51 PR Interval:  194 QRS Duration: 81 QT Interval:  473 QTC Calculation: 436 R Axis:   59 Text Interpretation:  Sinus rhythm Minimal ST elevation, inferior leads No  significant  change was found Confirmed by Sallye Lunz  MD, Caryn BeeKEVIN ([id number]) on  09/21/2014 3:33:44 PM      MDM   Final diagnoses:  Syncope  Bigeminy    Patient continues to have intermittent episodes of bigeminy here in the emergency department.  Magnesium, Foss, thyroid studies have been added.  Patient will be admitted to the hospital for ongoing workup of his frequent ectopy which I think is causing his symptoms.  Troponin is normal.  EKG at time of my evaluations without ischemic changes.  Patient denies chest pain at this time.  Patient will need serial enzymes.  If his bigeminy continues to may benefit from electrophysiology consultation. I think his beta blocker should be held.  [facility]: [facility]   Lyanne CoKevin M Charleston Vierling, MD 09/21/14 1556  Lyanne CoKevin M Bronco Mcgrory, MD 09/21/14  1610 

## 2014-09-21 NOTE — H&P (Addendum)
[facility] History and Physical  Jacob York ZOX:[id number] DOB: 07/15/1936 DOA: 09/21/2014  Referring physician: Dr Patria Mane.  PCP: Ron Parker, MD   Chief Complaint: pass out.   HPI: Jacob York is a 78 y.o. male with PMH significant for dementia, stroke, HTN who presents from SNF after he was found unresponsive on his wheelchair. Patient was diaphoretic and pale at that time. When EMS arrived patient patient was in bigeminy HR in the 20. Per EMS report patient was diaphoretic, pale, and with nausea.  Patient with history of dementia, he is not able to provide medical history. He doesn't know why he is here. His only complaint is that he can not pee. He denies chest pain, dyspnea, abdominal pain.   Evaluation in the ED;  Troponin negative, EKG sinus bradycardia, periods of bigeminy. K at 3.9, phosphorus at 4.4, Mg at 1.9. TSH 1.7.   Review of Systems:  Negative, except as per HPI.   Past Medical History  Diagnosis Date  . Hypertension   . Stroke   . Dementia    Past Surgical History  Procedure Laterality Date  . Inguinal hernia repair N/A 06/20/2014    Procedure: LAPAROSCOPIC BILATERAL INGUINAL HERNIA REPAIR, LEFT FEMORAL AND INCARCERATED SUPRA UMBILICAL HERNIA REPAIR WITH MESH;  Surgeon: Karie Soda, MD;  Location: [facility];  Service: General;  Laterality: N/A;  . Insertion of mesh N/A 06/20/2014    Procedure: INSERTION OF MESH;  Surgeon: Karie Soda, MD;  Location: [facility];  Service: General;  Laterality: N/A;   Social History:  reports that he has never smoked. He has never used smokeless tobacco. He reports that he does not drink alcohol or use illicit drugs.  No Known Allergies  Family History; unable to obtain from patient  Prior to Admission medications   Medication Sig Start Date End Date Taking? Authorizing Provider  acetaminophen (TYLENOL) 500 MG tablet Take 500 mg by mouth every 4 (four) hours as needed for mild pain or moderate pain (pain).    Yes  Historical Provider, MD  alum & mag hydroxide-simeth (MAALOX/MYLANTA) 200-200-20 MG/5ML suspension Take 30 mLs by mouth 4 (four) times daily as needed for indigestion or heartburn.    Yes Historical Provider, MD  aspirin 81 MG chewable tablet Chew 81 mg by mouth daily.   Yes Historical Provider, MD  cholecalciferol (VITAMIN D) 1000 UNITS tablet Take 1,000 Units by mouth daily.   Yes Historical Provider, MD  divalproex (DEPAKOTE SPRINKLE) 125 MG capsule Take 125 mg by mouth 2 (two) times daily.   Yes Historical Provider, MD  donepezil (ARICEPT) 10 MG tablet Take 10 mg by mouth at bedtime.   Yes Historical Provider, MD  guaifenesin (ROBITUSSIN) 100 MG/5ML syrup Take 200 mg by mouth 3 (three) times daily as needed for cough.   Yes Historical Provider, MD  haloperidol (HALDOL) 0.5 MG tablet Take 0.5 mg by mouth every 4 (four) hours as needed for agitation.   Yes Historical Provider, MD  lisinopril (PRINIVIL,ZESTRIL) 5 MG tablet Take 5 mg by mouth every morning.    Yes Historical Provider, MD  loperamide (IMODIUM) 2 MG capsule Take by mouth as needed for diarrhea or loose stools.   Yes Historical Provider, MD  magnesium hydroxide (MILK OF MAGNESIA) 400 MG/5ML suspension Take 30 mLs by mouth daily as needed for mild constipation.    Yes Historical Provider, MD  metoprolol succinate (TOPROL-XL) 25 MG 24 hr tablet Take 25 mg by mouth every morning.    Yes Historical  Provider, MD  mirtazapine (REMERON) 30 MG tablet Take 30 mg by mouth at bedtime.   Yes Historical Provider, MD  neomycin-bacitracin-polymyxin (NEOSPORIN) [phone] ointment Apply 1 application topically daily as needed (for skin tears,abrasions,minor irritation).    Yes Historical Provider, MD  traMADol (ULTRAM) 50 MG tablet Take 100 mg by mouth every 6 (six) hours as needed for moderate pain or severe pain (pain). 06/20/14  Yes Karie SodaSteven Gross, MD   Physical Exam: Filed Vitals:   09/21/14 1500 09/21/14 1504 09/21/14 1600 09/21/14 1630  BP: 94/59  94/59 102/51 104/62  Pulse: 62 69 34 65  Temp:      TempSrc:      Resp: 12 21 18 22   SpO2: 99% 99% 100% 98%    Wt Readings from Last 3 Encounters:  06/20/14 78.699 kg (173 lb 8 oz)  05/24/14 79.833 kg (176 lb)    General:  Appears calm and comfortable Eyes: PERRL, normal lids, irises & conjunctiva ENT: grossly normal hearing, lips & tongue Neck: no LAD, masses or thyromegaly Cardiovascular: RRR, no m/r/g. No LE edema. Telemetry: SR, PVC.  Respiratory: CTA bilaterally, no w/r/r. Normal respiratory effort. Abdomen: soft, ntnd Skin: no rash or induration seen on limited exam Musculoskeletal: grossly normal tone BUE/BLE Neurologic: grossly non-focal.          Labs on Admission:  Basic Metabolic Panel:  Recent Labs Lab 09/21/14 1355  NA 139  K 3.9  CL 101  CO2 22  GLUCOSE 92  BUN 20  CREATININE 1.32  CALCIUM 9.5   Liver Function Tests:  Recent Labs Lab 09/21/14 1355  AST 15  ALT 9  ALKPHOS 70  BILITOT 0.4  PROT 6.3  ALBUMIN 3.4*   No results for input(s): LIPASE, AMYLASE in the last 168 hours. No results for input(s): AMMONIA in the last 168 hours. CBC:  Recent Labs Lab 09/21/14 1355  WBC 10.8*  NEUTROABS 8.9*  HGB 13.7  HCT 40.6  MCV 87.1  PLT 184   Cardiac Enzymes:  Recent Labs Lab 09/21/14 1355  TROPONINI <0.30    BNP (last 3 results) No results for input(s): PROBNP in the last 8760 hours. CBG: No results for input(s): GLUCAP in the last 168 hours.  Radiological Exams on Admission: Dg Chest 2 View  09/21/2014   CLINICAL DATA:  Syncope and dementia.  EXAM: CHEST  2 VIEW  COMPARISON:  06/20/2014  FINDINGS: Lungs are clear. Stable appearance of the heart and mediastinum. Atherosclerotic calcifications at the aortic arch. The trachea is midline. No acute bone abnormalities. No evidence for large pleural effusions.  IMPRESSION: No active cardiopulmonary disease.   Electronically Signed   By: Richarda OverlieAdam  Henn M.D.   On: 09/21/2014 15:35    EKG:  Independently reviewed. Bradycardia. Hr 50.   Assessment/Plan Principal Problem:   Syncope Active Problems:   Dementia   Hypertension   Bigeminy   1-Syncope:// Bigeminy:  Admit to telemetry, cycle cardiac enzymes.  Cardiology consultation due to syncope and ? Symptomatic bradycardia.  Hold metoprolol for now. Will also hold mirtazapine, and Aricept.   2-Dementia; hold Aricept can cause bradycardia.   3-Mild leukocytosis; repeat in am.   Code Status: full code.  DVT Prophylaxis:lovenox.  Family Communication: None at bedside.  Disposition Plan: expect 2 to 3 days inpatient.   Time spent: 75 minutes.   Alba CoryRegalado, Donnelle Olmeda A [facility] Pager [phone]  Discussed with Franky MachoLuke, cardiology will take over patient care.  [facility] will sign off. Pleas call us if needed.  Jayjay Littles, Md.

## 2014-09-21 NOTE — ED Notes (Signed)
Pt with sats in the 80s. Pt placed on 3L [place] with sats in mid 90s.

## 2014-09-21 NOTE — ED Notes (Signed)
[facility]. Staff found pt unresponsive in his wheelchair. He was pale and diaphoretic. He was aroused and had 1 episode of emesis. Pt also SOB. 12 lead en route showed ventricular bigeminy. Pt received 4 mg of Zofran en route. Denies any CP. Pt has hx of advanced dementia. 12 lead here shows frequent PVCs. He is alert and oriented at baseline at this time. He is tachypnic. Skin pale, cool, and dry.

## 2014-09-22 ENCOUNTER — Encounter (HOSPITAL_COMMUNITY): Payer: Self-pay | Admitting: Cardiology

## 2014-09-22 DIAGNOSIS — N289 Disorder of kidney and ureter, unspecified: Secondary | ICD-10-CM

## 2014-09-22 DIAGNOSIS — Z87898 Personal history of other specified conditions: Secondary | ICD-10-CM | POA: Diagnosis not present

## 2014-09-22 DIAGNOSIS — R339 Retention of urine, unspecified: Secondary | ICD-10-CM

## 2014-09-22 DIAGNOSIS — I10 Essential (primary) hypertension: Secondary | ICD-10-CM

## 2014-09-22 HISTORY — DX: Retention of urine, unspecified: R33.9

## 2014-09-22 LAB — CBC
HCT: 38.3 % — ABNORMAL LOW (ref 39.0–52.0)
Hemoglobin: 12.8 g/dL — ABNORMAL LOW (ref 13.0–17.0)
MCH: 28.8 pg (ref 26.0–34.0)
MCHC: 33.4 g/dL (ref 30.0–36.0)
MCV: 86.3 fL (ref 78.0–100.0)
Platelets: 204 10*3/uL (ref 150–400)
RBC: 4.44 MIL/uL (ref 4.22–5.81)
RDW: 13 % (ref 11.5–15.5)
WBC: 5.8 10*3/uL (ref 4.0–10.5)

## 2014-09-22 LAB — URINALYSIS, ROUTINE W REFLEX MICROSCOPIC
Bilirubin Urine: NEGATIVE
Glucose, UA: NEGATIVE mg/dL
Ketones, ur: 15 mg/dL — AB
Nitrite: NEGATIVE
PROTEIN: NEGATIVE mg/dL
Specific Gravity, Urine: 1.017 (ref 1.005–1.030)
UROBILINOGEN UA: 1 mg/dL (ref 0.0–1.0)
pH: 8 (ref 5.0–8.0)

## 2014-09-22 LAB — HEMOGLOBIN A1C
Hgb A1c MFr Bld: 5.5 % (ref <5.7)
Mean Plasma Glucose: 111 mg/dL (ref <117)

## 2014-09-22 LAB — BASIC METABOLIC PANEL
Anion gap: 16 — ABNORMAL HIGH (ref 5–15)
BUN: 18 mg/dL (ref 6–23)
CO2: 21 mEq/L (ref 19–32)
Calcium: 9.6 mg/dL (ref 8.4–10.5)
Chloride: 105 mEq/L (ref 96–112)
Creatinine, Ser: 1.31 mg/dL (ref 0.50–1.35)
GFR calc Af Amer: 59 mL/min — ABNORMAL LOW (ref >90)
GFR calc non Af Amer: 51 mL/min — ABNORMAL LOW (ref >90)
Glucose, Bld: 82 mg/dL (ref 70–99)
Potassium: 4 mEq/L (ref 3.7–5.3)
Sodium: 142 mEq/L (ref 137–147)

## 2014-09-22 LAB — URINE MICROSCOPIC-ADD ON

## 2014-09-22 LAB — TROPONIN I: Troponin I: <0.3 ng/mL (ref <0.30)

## 2014-09-22 NOTE — Progress Notes (Signed)
Cardiology fellows paged pt not voiding bladder scanned for 561mls awaiting orders. Ilean SkillVeronica Jericka Kadar LPN

## 2014-09-22 NOTE — Progress Notes (Signed)
Nutrition Brief Note  Patient identified on the Malnutrition Screening Tool (MST) Report.  Per weight readings, patient has had a 6% weight loss since September 2015; not significant for time frame.  Wt Readings from Last 15 Encounters:  09/21/14 162 lb 11.2 oz (73.8 kg)  06/20/14 173 lb 8 oz (78.699 kg)  05/24/14 176 lb (79.833 kg)    Body mass index is 24.74 kg/(m^2). Patient meets criteria for Normal based on current BMI.   Current diet order is Heart Healthy. Labs and medications reviewed.   No nutrition interventions warranted at this time. If nutrition issues arise, please consult RD.   Maureen ChattersKatie Jakya Dovidio, RD, LDN Pager #: [phone] After-Hours Pager #: [phone]

## 2014-09-22 NOTE — Clinical Social Work Note (Signed)
Consult received for social work, CSW attempted to talk to patient, but he was not waking up and not able to be aroused to complete assessment will attempt tomorrow.  Ervin KnackEric R. Traniya Prichett, MSW, Theresia MajorsLCSWA [phone] 09/22/2014 4:33 PM

## 2014-09-22 NOTE — Progress Notes (Signed)
MD ordered I &O cath 2 nurses made the attempt and unsuccessful MD paged and informed and ordered a coude foley. 6 [place] RN was called to insert cath. Pt did tolerated well UA was sent. Will continue to monitor. Ilean SkillVeronica Thermon Zulauf LPN

## 2014-09-22 NOTE — Progress Notes (Signed)
Subjective: Sleeping initially, denies chest pain or SOB, feels better with the bladder cath.       Objective: Vital signs in last 24 hours: Temp:  [97.7 F (36.5 C)-98.3 F (36.8 C)] 98.3 F (36.8 C) (12/17 0351) Pulse Rate:  [34-69] 54 (12/17 0351) Resp:  [10-25] 18 (12/17 0351) BP: (94-131)/(51-87) 112/59 mmHg (12/17 0351) SpO2:  [98 %-100 %] 100 % (12/17 0351) Weight:  [162 lb 11.2 oz (73.8 kg)] 162 lb 11.2 oz (73.8 kg) (12/16 1757) Weight change:    Intake/Output from previous day:-450 12/16 0701 - 12/17 0700 In: -  Out: 450 [Urine:450] Intake/Output this shift:    PE: General:Pleasant affect, NAD Skin:Warm and dry, brisk capillary refill HEENT:normocephalic, sclera clear, mucus membranes moist Heart:S1S2 RRR without murmur, gallup, rub or click Lungs:clear without rales, rhonchi, or wheezes ZOX:WRUEAbd:soft, non tender, + BS, do not palpate liver spleen or masses Ext:no lower ext edema, 2+ pedal pulses, 2+ radial pulses Neuro:alert and oriented currently, MAE, follows commands, + facial symmetry TELE:SB down to 38 during the night.    Lab Results:  Recent Labs  09/21/14 1355 09/22/14 0353  WBC 10.8* 5.8  HGB 13.7 12.8*  HCT 40.6 38.3*  PLT 184 204   BMET  Recent Labs  09/21/14 1355 09/22/14 0353  NA 139 142  K 3.9 4.0  CL 101 105  CO2 22 21  GLUCOSE 92 82  BUN 20 18  CREATININE 1.32 1.31  CALCIUM 9.5 9.6    Recent Labs  09/21/14 2159 09/22/14 0353  TROPONINI <0.30 <0.30    No results found for: CHOL, HDL, LDLCALC, LDLDIRECT, TRIG, CHOLHDL Lab Results  Component Value Date   HGBA1C 5.5 09/21/2014     Lab Results  Component Value Date   TSH 1.780 09/21/2014    Hepatic Function Panel  Recent Labs  09/21/14 1355  PROT 6.3  ALBUMIN 3.4*  AST 15  ALT 9  ALKPHOS 70  BILITOT 0.4   No results for input(s): CHOL in the last 72 hours. No results for input(s): PROTIME in the last 72 hours.     Studies/Results: Dg Chest 2  View  09/21/2014   CLINICAL DATA:  Syncope and dementia.  EXAM: CHEST  2 VIEW  COMPARISON:  06/20/2014  FINDINGS: Lungs are clear. Stable appearance of the heart and mediastinum. Atherosclerotic calcifications at the aortic arch. The trachea is midline. No acute bone abnormalities. No evidence for large pleural effusions.  IMPRESSION: No active cardiopulmonary disease.   Electronically Signed   By: Richarda OverlieAdam  Henn M.D.   On: 09/21/2014 15:35    Medications: I have reviewed the patient's current medications. Scheduled Meds: . aspirin  81 mg Oral Daily  . aspirin EC  81 mg Oral Daily  . cholecalciferol  1,000 Units Oral Daily  . divalproex  125 mg Oral BID  . enoxaparin (LOVENOX) injection  40 mg Subcutaneous Q24H  . mirtazapine  30 mg Oral QHS  . sodium chloride  3 mL Intravenous Q12H  . sodium chloride  3 mL Intravenous Q12H   Continuous Infusions: . sodium chloride 75 mL/hr at 09/21/14 1835   PRN Meds:.sodium chloride, acetaminophen, alum & mag hydroxide-simeth, guaifenesin, haloperidol, nitroGLYCERIN, ondansetron **OR** ondansetron (ZOFRAN) IV, sodium chloride, traMADol  Assessment/Plan: Pleasantly demented 78 yo WM with A h/o HTN, admitted from nursing home after an apparent syncopal spell with bradycardia in the 4320s by EMS and bigeminal rhythm. No chest pain. ECG upon arrival revealed SB at 51. He  denies any chest pain.  He did complain of not being able to void. History is unreliable due to his dementia. He states that he live at home with wife and 2 children. Ages of children are "regular age" and that he goes to "school, no work" every day. No signs of CHF on exam . Occasional unifocal PVC's noted on telemetry. Will monitor orthostatics will order,none noted.  Mg, TSH electrolytes all WNL.  Scheduled for 2 d echo. At present will hold BB and ACE-I. Monitor BP and telemetry  Principal Problem:   Syncope- neg MI, no further syncope, will check orthostatic BPs, HR 51 on admit, further  bradycardia during the night down to 38 mostly in the 50s-- BB held was on toprol 25 daily, ACE held as well .    Active Problems:   Dementia-on haldol    Hypertension- controlled no BP down to 94 systolic in ER- receiving 75cc/hr NS overnight will decrease    Bigeminy PVCs    Bradycardia- see above note--? EP consult    Renal insufficiency-stage 3- 1 year ago Cr 1.36 GFR the same- stable    Urinary retention- could not void last pm and > 500 cc urine on bladder scan, unable to place foley, assigned RN placed coude foley.  U/a neg nitrates, ? vasovagal contributed to brady and syncope- add flomax if orthostatic BPs are good?    LOS: 1 day   Time spent with pt. :15 minutes. Marshall County HospitalNGOLD,LAURA R  Nurse Practitioner Certified Pager [phone] or after 5pm and on weekends call [phone] 09/22/2014, 8:23 AM  I have seen, examined and evaluated the patient this AM along with Nada BoozerLaura Ingold, NP.  After reviewing all the available data and chart,  I agree with her findings, examination as well as impression recommendations.  Pleasantly demented elderly man admitted for Syncope - was bradycardic to 30s o/mn (HR by EMS reported ~28).  Plan is for BB washout today & reassess in AM - if still bradycardic, EP to see to consider ? PPM.  Would not restart AVN agent. ? If syncope could have been related to Urinary retention? - feels better after foley --will need Urology f/u.   Marykay LexHARDING, DAVID W, M.D., M.S. Interventional Cardiologist   Pager # [phone]

## 2014-09-22 NOTE — Care Management Note (Signed)
    Page 1 of 1   09/26/2014     11:03:29 AM CARE MANAGEMENT NOTE 09/26/2014  Patient:  Jacob HatterKENNEDY,Khameron E   Account Number:  [id number]  Date Initiated:  09/22/2014  Documentation initiated by:  Donn PieriniWEBSTER,KRISTI  Subjective/Objective Assessment:   Pt admitted syncope/unresponsive     Action/Plan:   [facility]- CSW consulted- NCM to follow   Anticipated DC Date:  09/24/2014   Anticipated DC Plan:  SKILLED NURSING FACILITY  In-house referral  Clinical Social Worker      DC Planning Services  CM consult      Choice offered to / List presented to:             Status of service:  Completed, signed off Medicare Important Message given?  YES (If response is "NO", the following Medicare IM given date fields will be blank) Date Medicare IM given:  09/26/2014 Medicare IM given by:  [facility],Anntionette Madkins Date Additional Medicare IM given:   Additional Medicare IM given by:    Discharge Disposition:  SKILLED NURSING FACILITY  Per UR Regulation:  Reviewed for med. necessity/level of care/duration of stay  If discussed at Long Length of Stay Meetings, dates discussed:    Comments:  09-26-14 [place]11am Artyom Stencel, RNBSN [phone] Plan for dc to SNF tomorrow.  DCing sitter 24 hours prior. SW actively working on.

## 2014-09-22 NOTE — Progress Notes (Signed)
UR Completed.  [phone]  

## 2014-09-23 MED ORDER — TAMSULOSIN HCL 0.4 MG PO CAPS
0.4000 mg | ORAL_CAPSULE | Freq: Every day | ORAL | Status: DC
  Administered 2014-09-24 – 2014-09-27 (×4): 0.4 mg via ORAL
  Filled 2014-09-23 (×5): qty 1

## 2014-09-23 NOTE — Clinical Social Work Note (Addendum)
[name].  [facility] can not return with a catheter.  Informed physician, family and nurse that facility will not take patient with catheter.  Nurse requested orders for OT/PT consult from physician to see if patient [facility]  Discussed with son who is in agreement to look at patient going to SNF for short term rehab and to help patient work on getting catheter removed.  Patient will be faxed out once OT/PT completes evaluation.  Ervin KnackEric R. Rula Keniston, MSW, Theresia MajorsLCSWA [phone] 09/23/2014 5:05 PM

## 2014-09-23 NOTE — Progress Notes (Addendum)
Patient Profile: Pleasantly demented 78 yo WM witha h/o HTN, admitted from nursing home after an apparent syncopal spell with bradycardia in the 3920s by EMS and bigeminal rhythm (on BB therapy). R/O for MI with negative cardiac enzymes x 3.   Subjective: No complaints. Denies any further syncopal spells.   Objective: Vital signs in last 24 hours: Temp:  [97.4 F (36.3 C)-98.2 F (36.8 C)] 97.4 F (36.3 C) (12/18 1358) Pulse Rate:  [62-77] 77 (12/18 1358) Resp:  [18-19] 19 (12/18 1358) BP: (108-133)/(54-71) 108/54 mmHg (12/18 1358) SpO2:  [97 %-100 %] 97 % (12/18 1358) Last BM Date: 09/21/14  Intake/Output from previous day: 12/17 0701 - 12/18 0700 In: 360 [P.O.:360] Out: 400 [Urine:400] Intake/Output this shift: Total I/O In: 360 [P.O.:360] Out: 725 [Urine:725]  Medications Current Facility-Administered Medications  Medication Dose Route Frequency Provider Last Rate Last Dose  . 0.9 %  sodium chloride infusion  250 mL Intravenous PRN Abelino DerrickLuke K Kilroy, PA-C      . acetaminophen (TYLENOL) tablet 650 mg  650 mg Oral Q4H PRN Eda PaschalLuke K Kilroy, PA-C      . alum & mag hydroxide-simeth (MAALOX/MYLANTA) 200-200-20 MG/5ML suspension 30 mL  30 mL Oral QID PRN Belkys A Regalado, MD      . aspirin chewable tablet 81 mg  81 mg Oral Daily Belkys A Regalado, MD   0 mg at 09/21/14 2000  . aspirin EC tablet 81 mg  81 mg Oral Daily Abelino DerrickLuke K Kilroy, PA-C   81 mg at 09/23/14 1138  . cholecalciferol (VITAMIN D) tablet 1,000 Units  1,000 Units Oral Daily Alba CoryBelkys A Regalado, MD   1,000 Units at 09/23/14 1138  . divalproex (DEPAKOTE SPRINKLE) capsule 125 mg  125 mg Oral BID Belkys A Regalado, MD   125 mg at 09/23/14 1138  . enoxaparin (LOVENOX) injection 40 mg  40 mg Subcutaneous Q24H Belkys A Regalado, MD   40 mg at 09/22/14 1748  . guaifenesin (ROBITUSSIN) 100 MG/5ML syrup 200 mg  200 mg Oral TID PRN Belkys A Regalado, MD      . haloperidol (HALDOL) tablet 0.5 mg  0.5 mg Oral Q4H PRN Abelino DerrickLuke K Kilroy, PA-C    0.5 mg at 09/23/14 [id number]  . mirtazapine (REMERON) tablet 30 mg  30 mg Oral QHS Belkys A Regalado, MD   30 mg at 09/23/14 0059  . nitroGLYCERIN (NITROSTAT) SL tablet 0.4 mg  0.4 mg Sublingual Q5 Min x 3 PRN Luke K Kilroy, PA-C      . ondansetron (ZOFRAN) tablet 4 mg  4 mg Oral Q6H PRN Belkys A Regalado, MD       Or  . ondansetron (ZOFRAN) injection 4 mg  4 mg Intravenous Q6H PRN Belkys A Regalado, MD      . sodium chloride 0.9 % injection 3 mL  3 mL Intravenous Q12H Belkys A Regalado, MD   3 mL at 09/23/14 1000  . sodium chloride 0.9 % injection 3 mL  3 mL Intravenous Q12H Luke K Kilroy, PA-C   3 mL at 09/23/14 1000  . sodium chloride 0.9 % injection 3 mL  3 mL Intravenous PRN Eda PaschalLuke K Kilroy, PA-C      . tamsulosin (FLOMAX) capsule 0.4 mg  0.4 mg Oral Daily Brittainy M Simmons, PA-C      . traMADol (ULTRAM) tablet 100 mg  100 mg Oral Q6H PRN Belkys A Regalado, MD        PE: General appearance: alert, cooperative and no distress Neck:  no carotid bruit and no JVD Lungs: clear to auscultation bilaterally Heart: regular rate and rhythm Extremities: no LEE Pulses: 2+ and symmetric Skin: warm and dry Neurologic: mildly demented   Lab Results:   Recent Labs  09/21/14 1355 09/22/14 0353  WBC 10.8* 5.8  HGB 13.7 12.8*  HCT 40.6 38.3*  PLT 184 204   BMET  Recent Labs  09/21/14 1355 09/22/14 0353  NA 139 142  K 3.9 4.0  CL 101 105  CO2 22 21  GLUCOSE 92 82  BUN 20 18  CREATININE 1.32 1.31  CALCIUM 9.5 9.6    Assessment/Plan  Principal Problem:   Syncope Active Problems:   Chronic renal insufficiency, stage III (moderate)   Dementia   Hypertension   Bigeminy   Bradycardia   Urinary retention  1. Syncope: no recurrent episodes. Due to problem #2 which has been corrected.  2. Bradycardia: reversible cause identified and felt to be medication induced (BB therapy). ACS was ruled out with lack of CP and negative cardiac enzymes x 3. K, Mg and TSH all normal. HR has  improved with discontinuation of metoprolol (was on 25 mg of Toprol XL as OP). Review on telemetry show SR with rates in the low 60s upper 50s. No further significant bradycardia or pauses in the last 12 hrs.  3. Bigeminy: off of BB therapy, patient continues to have frequent PVCs and bigeminy. Echo shows normal EF at 60-65% with normal wall thickness and normal wall motion. ? Consideration for outpatient cardiac event monitoring to assess ectopic burden. If burden is high, may consider re-initiation of low dose BB, perhaps short acting Lopressor 12.5 mg if not bradycardic. If buren is high but bradycardia prohibits initiation of BB, then will consider possible OP referral to EP for consideration of PPM.   Estimated Creatinine Clearance: 46.4 mL/min (by C-G formula based on Cr of 1.31). -- CKD-3   Dispo: possible discharge back to nursing facility today. MD assessment pending.     LOS: 2 days    Brittainy M. Sharol Harness, PA-C 09/23/2014 5:08 PM  I have seen, examined and evaluated the patient this AM along with Ms. Sharol Harness, PA-C.  After reviewing all the available data and chart,  I agree with her findings, examination as well as impression recommendations.  Still a bit bradycardic, but stable in 50s off of BB.  Did have some Bigeminy & intermittent PVCs -- will just have to monitor - OP 48 hr holter monitor to assess burden of PVCs & HR.  No recurrent Syncope.  Needs to ambulate in hall & anticipate d/c to SNF this PM - I intentionally delayed his discharge until the afternoon to ensure we have more time monitoring his heart rate since the beta blocker was just stopped yesterday    HARDING, Piedad Climes, M.D., M.S. Interventional Cardiologist   Pager # [phone]  ADDENDUM:  Unfortunately - the patient is currently a resident @ an Assisted Living Facility Memory unit that will not accept pt with indwelling Coude Catheter - placed for urinary retention upon admission. I discussed the case  with On-Call Urologist who recommended Keeping Catheter in place - start Flomax today.  Try to arrange short term SNF d/c & arrange Urology f/u for Removal Challenge -- will set up for early next week @ [facility].  Will not be able to d/c today.  SW working to arrange short Term SNF & return to Assisted Living once he no-longer has in-dwelling catheter.   HARDING, DAVID  Lacretia NicksW, MD

## 2014-09-23 NOTE — Progress Notes (Addendum)
Assisted to Pt to ambulate hall using RW and standby x's 2.  Approx 50 ft with complaints of fatigue at that point.  HR 80-90 for duration of walk with occasional PVC's.  Returned to Forensic scientistrecliner with safety sitter at side.  SW aware of likely medical clearance for DC this afternoon.  Await call back regarding need for 24hrs without sitter.  If DC possible for today will clarify plan around coude catheter.

## 2014-09-23 NOTE — Progress Notes (Signed)
   Patient is stable from a cardiac standpoint and plan was for discharge back to assisted living. However, the facility that he presented from will not accept due to newly placed coude catheter. This was placed at time of admit 09/21/14 due to problems voiding and urine retention with > 500 cc urine on bladder scan. Dr. Herbie BaltimoreHarding has dicussed with urology. Dr. Sherron MondayMacDiarmid of [facility] recommended keeping catheter in place for now. We will initiate Flomax and will arrange OP f/u with [facility] early next week for foley removal trial. In the interim, he will need temporary placement at a SNF that will accept foleys. CM has been consulted. They need a OT/PT consult to continue search. Will order for assessment. Patient will likely not get discharged today due to placement needs. Dr. Herbie BaltimoreHarding is in agreement with this plan.   Urology appointment scheduled for 12/21 with Dr. Brunilda PayorNesi.   SIMMONS, BRITTAINY 09/23/2014

## 2014-09-23 NOTE — Clinical Documentation Improvement (Signed)
Please clarify "Renal insufficiency-stage 3-". Thank you.  . Document the stage of CKD --Chronic kidney disease, stage 1- GFR > OR = 90 --Chronic kidney disease, stage 2 (mild) - GFR 60-89 --Chronic kidney disease, stage 3 (moderate) - GFR 30-59 --Chronic kidney disease, stage 4 (severe) - GFR 15-29 --Chronic kidney disease, stage 5- GFR < 15 --End-stage renal disease (ESRD) . Document any underlying cause of CKD such as Diabetes or Hypertension . Chronic renal failure without a documented stage will be assigned to Chronic kidney disease, unspecified . Document any associated diagnoses/conditions  Supporting Information:  History of hypertension  12/17 progress note: Renal insufficiency-stage 3- 1 year ago Cr 1.36 GFR the same- stable  Component     Latest Ref Rng 09/21/2014 09/22/2014           BUN     6 - 23 mg/dL 20 18  Creatinine     1.610.50 - 1.35 mg/dL 0.961.32 0.451.31                              GFR calc non Af Amer     >90 mL/min 51 (L) 51 (L)   Treatment: Saline locked IV Daily weights Monitoring I&O Monitoring labs  Thank you, Quin Mathenia T. Luiz OchoaWilliams RN, MSN, MBA/MHA Clinical Documentation Specialist [email or web address] Office # [phone]

## 2014-09-24 DIAGNOSIS — F0391 Unspecified dementia with behavioral disturbance: Secondary | ICD-10-CM

## 2014-09-24 DIAGNOSIS — N189 Chronic kidney disease, unspecified: Secondary | ICD-10-CM

## 2014-09-24 NOTE — Evaluation (Addendum)
Physical Therapy Evaluation Patient Details Name: Jacob HatterBruce E Busenbark MRN: [id number] DOB: 04/17/1936 Today's Date: 09/24/2014   History of Present Illness  78 y.o. male with history of dementia admitted from nursing home after an apparent syncopal spell with bradycardia in the 2320s by EMS and bigeminal rhythm.  Clinical Impression  Pt admitted with the above. Pt currently with functional limitations due to the deficits listed below (see PT Problem List). Tolerated ambulating with min assist for frequent loss of balance and max verbal cues for sequencing, but quickly became agitated while working with therapy. Pt will benefit from skilled PT to increase their independence and safety with mobility to allow discharge to the venue listed below.       Follow Up Recommendations SNF;Supervision/Assistance - 24 hour    Equipment Recommendations  None recommended by PT    Recommendations for Other Services       Precautions / Restrictions Precautions Precautions: Fall Restrictions Weight Bearing Restrictions: No      Mobility  Bed Mobility Overal bed mobility: Needs Assistance Bed Mobility: Supine to Sit     Supine to sit: Min assist;HOB elevated     General bed mobility comments: Min assist for support into seated position with HOB elevated.   Transfers Overall transfer level: Needs assistance Equipment used: Rolling walker (2 wheeled) Transfers: Sit to/from Stand Sit to Stand: Min assist         General transfer comment: Min assist for balance from lowest bed setting. pt pushes RW away once standing.  VC for technique and to facilitate  Ambulation/Gait Ambulation/Gait assistance: Min assist Ambulation Distance (Feet): 15 Feet Assistive device: Rolling walker (2 wheeled);1 person hand held assist Gait Pattern/deviations: Step-through pattern;Decreased stride length;Leaning posteriorly;Staggering left;Narrow base of support Gait velocity: decreased   General Gait Details:  Min assist for frequent loss of balance to left and posterior. Unable to sequence with walker despite cues and hand held assist. max VC to have pt participate. Reaches for furniture and becomes agitated  Stairs            Wheelchair Mobility    Modified Rankin (Stroke Patients Only)       Balance Overall balance assessment: Needs assistance Sitting-balance support: No upper extremity supported;Feet supported Sitting balance-Leahy Scale: Fair     Standing balance support: Single extremity supported Standing balance-Leahy Scale: Poor                               Pertinent Vitals/Pain Pain Assessment: No/denies pain    Home Living Family/patient expects to be discharged to:: Skilled nursing facility   Available Help at Discharge: Skilled Nursing Facility Type of Home: Skilled Nursing Facility           Additional Comments: Patient is a poor historian. Most information provided in this evaluation taken from notes.    Prior Function Level of Independence: Needs assistance         Comments: Patient was living in a skilled nursing facility prior to admission. Unable to verbalize PLOF due to cognitive deficits     Hand Dominance        Extremity/Trunk Assessment   Upper Extremity Assessment: Defer to OT evaluation           Lower Extremity Assessment: Difficult to assess due to impaired cognition         Communication   Communication: No difficulties  Cognition Arousal/Alertness: Awake/alert Behavior During Therapy: Agitated;Impulsive Overall Cognitive  Status: History of cognitive impairments - at baseline                      General Comments General comments (skin integrity, edema, etc.): HR 94, SpO2 97%    Exercises        Assessment/Plan    PT Assessment Patient needs continued PT services  PT Diagnosis Difficulty walking;Abnormality of gait   PT Problem List Decreased strength;Decreased range of  motion;Decreased activity tolerance;Decreased balance;Decreased mobility;Decreased knowledge of use of DME;Decreased cognition;Decreased safety awareness;Decreased knowledge of precautions  PT Treatment Interventions DME instruction;Gait training;Functional mobility training;Therapeutic activities;Therapeutic exercise;Balance training;Neuromuscular re-education;Cognitive remediation;Patient/family education;Modalities   PT Goals (Current goals can be found in the Care Plan section) Acute Rehab PT Goals Patient Stated Goal: none stated PT Goal Formulation: Patient unable to participate in goal setting Time For Goal Achievement: 10/08/14 Potential to Achieve Goals: Fair    Frequency Min 2X/week   Barriers to discharge        Co-evaluation               End of Session Equipment Utilized During Treatment: Gait belt Activity Tolerance: Treatment limited secondary to agitation Patient left: in chair;with call bell/phone within reach;with nursing/sitter in room Nurse Communication: Mobility status         Time: 1620-1629 PT Time Calculation (min) (ACUTE ONLY): 9 min   Charges:   PT Evaluation $Initial PT Evaluation Tier I: 1 Procedure     PT G CodesBerton Mount:          Donaciano Range S 09/24/2014, 5:50 PM Sunday SpillersLogan Secor Marklesburg[place]bour, [place] CarolinaPT [phone]

## 2014-09-24 NOTE — Progress Notes (Signed)
Patient Profile: Pleasantly demented 78 yo WM witha h/o HTN, admitted from nursing home after an apparent syncopal spell with bradycardia in the 6520s by EMS and bigeminal rhythm (on BB therapy). R/O for MI with negative cardiac enzymes x 3.   Subjective: No complaints. Sleeping comfortably.   Objective: Vital signs in last 24 hours: Temp:  [97.4 F (36.3 C)-98.3 F (36.8 C)] 97.8 F (36.6 C) (12/19 0539) Pulse Rate:  [62-85] 62 (12/19 0539) Resp:  [18-19] 18 (12/19 0539) BP: (108-141)/(54-75) 141/75 mmHg (12/19 0539) SpO2:  [97 %-99 %] 99 % (12/19 0539) Weight:  [164 lb 3.2 oz (74.481 kg)] 164 lb 3.2 oz (74.481 kg) (12/19 0317) Last BM Date: 09/21/14  Intake/Output from previous day: 12/18 0701 - 12/19 0700 In: 360 [P.O.:360] Out: 1125 [Urine:1125] Intake/Output this shift:    Medications Current Facility-Administered Medications  Medication Dose Route Frequency Provider Last Rate Last Dose  . 0.9 %  sodium chloride infusion  250 mL Intravenous PRN Abelino DerrickLuke K Kilroy, PA-C      . acetaminophen (TYLENOL) tablet 650 mg  650 mg Oral Q4H PRN Eda PaschalLuke K Kilroy, PA-C      . alum & mag hydroxide-simeth (MAALOX/MYLANTA) 200-200-20 MG/5ML suspension 30 mL  30 mL Oral QID PRN Belkys A Regalado, MD      . aspirin chewable tablet 81 mg  81 mg Oral Daily Belkys A Regalado, MD   0 mg at 09/21/14 2000  . aspirin EC tablet 81 mg  81 mg Oral Daily Abelino DerrickLuke K Kilroy, PA-C   81 mg at 09/24/14 1040  . cholecalciferol (VITAMIN D) tablet 1,000 Units  1,000 Units Oral Daily Belkys A Regalado, MD   1,000 Units at 09/24/14 1040  . divalproex (DEPAKOTE SPRINKLE) capsule 125 mg  125 mg Oral BID Belkys A Regalado, MD   125 mg at 09/24/14 1040  . enoxaparin (LOVENOX) injection 40 mg  40 mg Subcutaneous Q24H Belkys A Regalado, MD   40 mg at 09/22/14 1748  . guaifenesin (ROBITUSSIN) 100 MG/5ML syrup 200 mg  200 mg Oral TID PRN Belkys A Regalado, MD      . haloperidol (HALDOL) tablet 0.5 mg  0.5 mg Oral Q4H PRN  Abelino DerrickLuke K Kilroy, PA-C   0.5 mg at 09/23/14 [id number]  . mirtazapine (REMERON) tablet 30 mg  30 mg Oral QHS Belkys A Regalado, MD   30 mg at 09/23/14 2121  . nitroGLYCERIN (NITROSTAT) SL tablet 0.4 mg  0.4 mg Sublingual Q5 Min x 3 PRN Luke K Kilroy, PA-C      . ondansetron (ZOFRAN) tablet 4 mg  4 mg Oral Q6H PRN Belkys A Regalado, MD       Or  . ondansetron (ZOFRAN) injection 4 mg  4 mg Intravenous Q6H PRN Belkys A Regalado, MD      . sodium chloride 0.9 % injection 3 mL  3 mL Intravenous Q12H Belkys A Regalado, MD   3 mL at 09/23/14 2122  . sodium chloride 0.9 % injection 3 mL  3 mL Intravenous Q12H Abelino DerrickLuke K Kilroy, PA-C   3 mL at 09/23/14 2121  . sodium chloride 0.9 % injection 3 mL  3 mL Intravenous PRN Abelino DerrickLuke K Kilroy, PA-C      . tamsulosin [facility](FLOMAX) capsule 0.4 mg  0.4 mg Oral Daily Brittainy Sherlynn CarbonM Simmons, PA-C   0.4 mg at 09/24/14 1040  . traMADol (ULTRAM) tablet 100 mg  100 mg Oral Q6H PRN Alba CoryBelkys A Regalado, MD  PE: General appearance: alert, cooperative and no distress Neck: no carotid bruit and no JVD Lungs: clear to auscultation bilaterally Heart: regular rate and rhythm Extremities: no LEE Pulses: 2+ and symmetric Skin: warm and dry Neurologic: mildly demented   Lab Results:   Recent Labs  09/21/14 1355 09/22/14 0353  WBC 10.8* 5.8  HGB 13.7 12.8*  HCT 40.6 38.3*  PLT 184 204   BMET  Recent Labs  09/21/14 1355 09/22/14 0353  NA 139 142  K 3.9 4.0  CL 101 105  CO2 22 21  GLUCOSE 92 82  BUN 20 18  CREATININE 1.32 1.31  CALCIUM 9.5 9.6     Assessment/Plan  Principal Problem:   Syncope Active Problems:   Dementia   Hypertension   Bigeminy   Bradycardia   Chronic renal insufficiency, stage III (moderate)   Urinary retention  1. Syncope: no recurrent episodes. Due to problem #2 which has been corrected.  2. Bradycardia: reversible cause identified and felt to be medication induced (BB therapy). ACS was ruled out with lack of CP and negative cardiac enzymes  x 3. K, Mg and TSH all normal. HR has improved with discontinuation of metoprolol (was on 25 mg of Toprol XL as OP). Review on telemetry show SR with rates in the low 60s upper 50s. No further significant bradycardia or pauses in the last 12 hrs.  3. Bigeminy: off of BB therapy, patient continues to have frequent PVCs and bigeminy. Echo shows normal EF at 60-65% with normal wall thickness and normal wall motion. ? Consideration for outpatient cardiac event monitoring to assess ectopic burden. If burden is high, may consider re-initiation of low dose BB, perhaps short acting Lopressor 12.5 mg if not bradycardic. If buren is high but bradycardia prohibits initiation of BB, then will consider possible OP referral to EP for consideration of PPM.   Estimated Creatinine Clearance: 46.4 mL/min (by C-G formula based on Cr of 1.31). -- CKD-3  Dispo: possible discharge back to nursing facility as soon as they find one that has experience with a specific catheter he has. The patient is currently a resident @ an Assisted Living Facility Memory unit that will not accept pt with indwelling Coude Catheter - placed for urinary retention upon admission. I discussed the case with On-Call Urologist who recommended Keeping Catheter in place - start Flomax today.  Try to arrange short term SNF d/c & arrange Urology f/u for Removal Challenge -- will set up for early next week @ [facility].  Will not be able to d/c today.  SW working to arrange short Term SNF & return to Assisted Living once he no-longer has in-dwelling catheter.  Lars MassonNELSON, Halli Equihua H, M.D., [facility] 09/24/2014

## 2014-09-25 NOTE — Clinical Social Work Placement (Addendum)
    Clinical Social Work Department CLINICAL SOCIAL WORK PLACEMENT NOTE 09/25/2014  Patient:  Jacob York,Jacob York  Account Number:  [id number] Admit date:  09/21/2014  Clinical Social Worker:  Harless NakayamaPOONUM AMBELAL, LCSWA  Date/time:  09/25/2014 09:49 AM  Clinical Social Work is seeking post-discharge placement for this patient at the following level of care:   SKILLED NURSING   (*CSW will update this form in Epic as items are completed)   09/25/2014  Patient/family provided with [facility] of Clinical Social Work's list of facilities offering this level of care within the geographic area requested by the patient (or if unable, by the patient's family).  09/25/2014  Patient/family informed of their freedom to choose among providers that offer the needed level of care, that participate in Medicare, Medicaid or managed care program needed by the patient, have an available bed and are willing to accept the patient.  09/25/2014  Patient/family informed of [facility]' ownership interest in [facility], as well as of the fact that they are under no obligation to receive care at this facility.  PASARR submitted to EDS on 09/26/14 Windell Moulding(Mykale Gandolfo, MSW, [place]CSWA, updated 09/27/14) PASARR number received on 09/26/14 Windell Moulding(Rain Wilhide, MSW, [place], updated 09/27/14)  FL2 transmitted to all facilities in geographic area requested by pt/family on  09/25/2014 FL2 transmitted to all facilities within larger geographic area on 09/26/14 Windell Moulding(Domanik [place], MSW, [place]WA, updated 09/27/14)  Patient informed that his/her managed care company has contracts with or will negotiate with  certain facilities, including the following:     Patient/family informed of bed offers received: 09/26/14 Windell Moulding(Messina Kosinski, MSW, [place], updated 09/27/14)  Patient [facility] (Windell MouldingEric Nadira Single, MSW, [place], updated 09/27/14) Physician recommends and patient chooses bed at    Patient to be  [facility] on 09/27/14 Windell Moulding(Ryszard Socarras, MSW, LCSWA, updated 09/27/14)   Patient to be transferred to facility by [facility] Windell Moulding(Khianna Blazina, MSW, LCSWA, updated 09/27/14) Patient and family notified of transfer on 09/27/14 Windell Moulding(Vere Diantonio, MSW, [place]WA, updated 09/27/14)  Name of family member notified: Dannielle Karvonenonald Mosley   The following physician request were entered in Epic: Physician Request      Additional Comments:    Harless Nakayamaoonum Ambelal, LCSWA Weekend CSW [phone]  Windell Moulding(Aaren Krog, MSW, [place]CSWA, updated 09/27/14) Ervin KnackEric R. Rodel Glaspy, MSW, Theresia MajorsLCSWA [phone] 09/27/2014 2:47 PM

## 2014-09-26 NOTE — Progress Notes (Signed)
Patient Profile: Pleasantly demented 78 yo WM witha h/o HTN, admitted from nursing home after an apparent syncopal spell with bradycardia in the 6520s by EMS and bigeminal rhythm (on BB therapy). R/O for MI with negative cardiac enzymes x 3.   Subjective: No complaints. He has baseline dementia but appears to be more confused today vs last week. Resting comfortably.   Objective: Vital signs in last 24 hours: Temp:  [97.4 F (36.3 C)-98.3 F (36.8 C)] 98.3 F (36.8 C) (12/21 0335) Pulse Rate:  [70-98] 70 (12/21 0335) Resp:  [16-18] 18 (12/21 0335) BP: (121-152)/(73-91) 126/73 mmHg (12/21 0335) SpO2:  [98 %-100 %] 98 % (12/21 0335) Weight:  [161 lb 13.1 oz (73.4 kg)-174 lb 4.8 oz (79.062 kg)] 161 lb 13.1 oz (73.4 kg) (12/21 0335) Last BM Date: 09/21/14  Intake/Output from previous day: 12/20 0701 - 12/21 0700 In: 360 [P.O.:360] Out: 700 [Urine:700] Intake/Output this shift: Total I/O In: 120 [P.O.:120] Out: 700 [Urine:700]  Medications Current Facility-Administered Medications  Medication Dose Route Frequency Provider Last Rate Last Dose  . 0.9 %  sodium chloride infusion  250 mL Intravenous PRN Abelino DerrickLuke K Kilroy, PA-C      . acetaminophen (TYLENOL) tablet 650 mg  650 mg Oral Q4H PRN Eda PaschalLuke K Kilroy, PA-C      . alum & mag hydroxide-simeth (MAALOX/MYLANTA) 200-200-20 MG/5ML suspension 30 mL  30 mL Oral QID PRN Belkys A Regalado, MD      . aspirin chewable tablet 81 mg  81 mg Oral Daily Belkys A Regalado, MD   0 mg at 09/21/14 2000  . aspirin EC tablet 81 mg  81 mg Oral Daily Abelino DerrickLuke K Kilroy, PA-C   81 mg at 09/25/14 0940  . cholecalciferol (VITAMIN D) tablet 1,000 Units  1,000 Units Oral Daily Alba CoryBelkys A Regalado, MD   1,000 Units at 09/25/14 0940  . divalproex (DEPAKOTE SPRINKLE) capsule 125 mg  125 mg Oral BID Belkys A Regalado, MD   125 mg at 09/25/14 2155  . enoxaparin (LOVENOX) injection 40 mg  40 mg Subcutaneous Q24H Belkys A Regalado, MD   40 mg at 09/25/14 1727  . guaifenesin  (ROBITUSSIN) 100 MG/5ML syrup 200 mg  200 mg Oral TID PRN Belkys A Regalado, MD      . haloperidol (HALDOL) tablet 0.5 mg  0.5 mg Oral Q4H PRN Abelino DerrickLuke K Kilroy, PA-C   0.5 mg at 09/23/14 [id number]  . mirtazapine (REMERON) tablet 30 mg  30 mg Oral QHS Belkys A Regalado, MD   30 mg at 09/25/14 2155  . nitroGLYCERIN (NITROSTAT) SL tablet 0.4 mg  0.4 mg Sublingual Q5 Min x 3 PRN Luke K Kilroy, PA-C      . ondansetron (ZOFRAN) tablet 4 mg  4 mg Oral Q6H PRN Belkys A Regalado, MD       Or  . ondansetron (ZOFRAN) injection 4 mg  4 mg Intravenous Q6H PRN Belkys A Regalado, MD      . sodium chloride 0.9 % injection 3 mL  3 mL Intravenous Q12H Belkys A Regalado, MD   3 mL at 09/25/14 0941  . sodium chloride 0.9 % injection 3 mL  3 mL Intravenous Q12H Abelino DerrickLuke K Kilroy, PA-C   3 mL at 09/25/14 2155  . sodium chloride 0.9 % injection 3 mL  3 mL Intravenous PRN Abelino DerrickLuke K Kilroy, PA-C      . tamsulosin (FLOMAX) capsule 0.4 mg  0.4 mg Oral Daily Brittainy M Simmons, PA-C   0.4 mg  at 09/25/14 0940  . traMADol (ULTRAM) tablet 100 mg  100 mg Oral Q6H PRN Belkys A Regalado, MD        PE: General appearance: alert, cooperative and no distress Neck: no carotid bruit and no JVD Lungs: clear to auscultation bilaterally Heart: regular rate and rhythm Extremities: no LEE Pulses: 2+ and symmetric Skin: warm and dry Neurologic: mildly demented   Lab Results:  No results for input(s): WBC, HGB, HCT, PLT in the last 72 hours. BMET No results for input(s): NA, K, CL, CO2, GLUCOSE, BUN, CREATININE, CALCIUM in the last 72 hours.   Assessment/Plan  Principal Problem:   Syncope Active Problems:   Dementia   Hypertension   Bigeminy   Bradycardia   Chronic renal insufficiency, stage III (moderate)   Urinary retention  1. Syncope: no recurrent episodes. Due to problem #2 which has been corrected.  2. Bradycardia: reversible cause identified and felt to be medication induced (BB therapy). ACS was ruled out with lack of CP  and negative cardiac enzymes x 3. K, Mg and TSH all normal. HR has improved with discontinuation of metoprolol (was on 25 mg of Toprol XL as OP). Review on telemetry show SR with rates in mid 60s. No further significant bradycardia or pauses in the last 12 hrs.  3. Bigeminy: off of BB therapy, patient continues to have frequent PVCs and bigeminy. Echo shows normal EF at 60-65% with normal wall thickness and normal wall motion. ? Consideration for outpatient cardiac event monitoring to assess ectopic burden. If burden is high, may consider re-initiation of low dose BB, perhaps short acting Lopressor 12.5 mg if not bradycardic. If buren is high but bradycardia prohibits initiation of BB, then will consider possible OP referral to EP for consideration of PPM.   4. Urinary Retension: catheter in place. Flomax started 09/23/14 per urology. Pt will need OP urology appointment for trial foley removal (was initially scheduled for today. This will need to be rescheduled).   Dispo: SNF. Placement pending  Robbie LisSIMMONS, BRITTAINY, M.D., [facility] 09/26/2014  The patient was seen, examined and discussed with Brittainy M. Sharol HarnessSimmons, PA-C and I agree with the above.   The patient is stable from cardiac standpoint, no recurrent syncope, bradycardia has resolved. He has been waiting on placement to a SNF, possibly today.  Lars MassonELSON, Kasheena Sambrano H 09/26/2014

## 2014-09-26 NOTE — Progress Notes (Signed)
PT Cancellation Note  Patient Details Name: Jacob York MRN: [id number] DOB: [birth year]   Cancelled Treatment:    Reason Eval/Treat Not Completed: Patient declined, no reason specified Patient states he is "not interested" in physical therapy. Explained the importance of being mobile and out of bed as much as he is able. Continues to decline. Will follow up as time allows.  [address]Logan Secor SatillaBarbour, South CarolinaPT [phone]  [name], Jacob York, 3:09 PM

## 2014-09-26 NOTE — Clinical Social Work Psychosocial (Signed)
Clinical Social Work Department BRIEF PSYCHOSOCIAL ASSESSMENT 09/22/2014  Patient:  Barnett HatterKENNEDY,Greycen E     Account Number:  [id number]     Admit date:  09/21/2014  Clinical Social Worker:  Elouise MunroeANTERHAUS,Amanii Snethen, LCSWA  Date/Time:  09/22/2014 04:25 PM  Referred by:  Physician  Date Referred:  09/22/2014 Referred for  SNF Placement   Other Referral:   Interview type:  Patient Other interview type:   Patient's son Dorinda HillDonald    PSYCHOSOCIAL DATA Living Status:  FACILITY Admitted from facility:  Other Level of care:  Assisted Living Primary support name:  Dannielle KarvonenDonald Nicolaisen Primary support relationship to patient:  FAMILY Degree of support available:   Patient has demetia and lives in ALF    CURRENT CONCERNS Current Concerns  Post-Acute Placement   Other Concerns:    SOCIAL WORK ASSESSMENT / PLAN Patient is a 78 year old male who [facility] [facility] and has dementia.  Patient was not alert or oriented. Patient was pleasantly confused when talking with him. Patient not able to converse much with CSW.  Plan is for patient to go to SNF for short term rehab and catheter care once he is medically ready and discharge orders have been produced.   Assessment/plan status:  Psychosocial Support/Ongoing Assessment of Needs Other assessment/ plan:   Information/referral to community resources:    PATIENT'S/FAMILY'S RESPONSE TO PLAN OF CARE: Patient and family in agreement to plan to go to SNF for short term rehab and catheter care until urology is able to removed catheter.   Ervin KnackEric R. Chyler Creely, MSW, Theresia MajorsLCSWA [phone] 09/26/2014 5:35 PM

## 2014-09-27 ENCOUNTER — Other Ambulatory Visit: Payer: Self-pay | Admitting: Cardiology

## 2014-09-27 DIAGNOSIS — R001 Bradycardia, unspecified: Secondary | ICD-10-CM

## 2014-09-27 MED ORDER — TAMSULOSIN HCL 0.4 MG PO CAPS: 0.4000 mg | ORAL_CAPSULE | Freq: Every day | ORAL | Status: DC

## 2014-09-27 NOTE — Progress Notes (Signed)
PT Cancellation Note  Patient Details Name: Jacob York MRN: [id number] DOB: 04/30/1936   Cancelled Treatment:    Reason Eval/Treat Not Completed: Patient declined, no reason specified (Pt preparing to DC to SNF and denied any mobility at this time)   Delorse Lekabor, Kymora Sciara Beth 09/27/2014, 1:51 PM Delaney MeigsMaija Tabor Fabiha Rougeau, PT [phone]

## 2014-09-27 NOTE — Discharge Summary (Signed)
Physician Discharge Summary  Patient ID: Jacob York MRN: [id number] DOB/AGE: Sep 27, 1936 78 y.o.   Primary Cardiologist: Dr. Tresa EndoKelly  Admit date: 09/21/2014 Discharge date: 09/27/2014   Admission Diagnoses: Syncope  Discharge Diagnoses:  Principal Problem:   Syncope Active Problems:   Dementia   Hypertension   Bigeminy   Bradycardia   Chronic renal insufficiency, stage III (moderate)   Urinary retention   Discharged Condition: stable  Hospital Course: The patient is a 78 y/o male with no prior cardiac history who [facility] for syncope. His past medical history is significant for dementia. He is a resident at an assisted living facility. [facility] after being found unresponsive in his wheelchair at the assisted living facility. Patient was also noted to be pale and diaphoretic. On arrival to the ER, he was alert. Telemetry demonstrated ventricular bigeminy with bradycardia. HR initially was in the 30s but improved into the low 50s. BP was stable. ER labs included a normal troponin. K at 3.9, phosphorus at 4.4, Mg at 1.9. TSH 1.7. Review of home medications revealed that he was on BB therapy (25 mg of Toprol-XL daily). Per reports from EMS, he has received a dose at nursing facility prior to his event. His BB was held on admission and he was monitored on telemetry. Cardiac enzymes continued to be cycled and were negative x 3. 2D echo demonstrated normal EF at 60-65% with normal wall thickness and normal wall motion. He continued to have borderline bradycardia off of his BB with rates in the upper 50s-low 60s, but no recurrent severe bradycardia. His BP remained stable. He had no further syncope/ near syncope. He did however have frequent PVCs/ ventricular bigeminy off of BB. However, decision was made to continue to hold off with further BB therapy. It has been recommended that he be further evaluated as an OP with a 2 week event monitor to  assess ectopic burden and HR.  It should also be noted that during hospitalization, the patient endorsed difficulty voiding. He was also noted to have decreased urinary output. Bladder scan demonstrated urine retention with > 500 cc of urine. Subsequently, a coude catheter was placed. Urology recommended initiation of Flomax and trial removal of foley as an outpatient. [facility] with Dr. Marlou PorchHerrick 10/03/14.  Due to the presence of a urinary catheter, the patient could not be discharged back to his assisted living facility. Short term placement was arranged at a SNF.  He was last seen and examined by Dr. Delton SeeNelson who determined he was stable for discharge home. An appointment has been arranged for him to pick up a 2 week event monitor on 09/28/14. Post-hospital f/u has been arranged with Wilburt FinlayBryan Hager, PA-C, on 10/21/13.    Consults: None  Significant Diagnostic Studies:  2D echo 09/27/14 Study Conclusions  - Left ventricle: The cavity size was normal. Wall thickness was normal. Systolic function was normal. The estimated ejection fraction was in the range of 60% to 65%. Wall motion was normal; there were no regional wall motion abnormalities. Doppler parameters are consistent with abnormal left ventricular relaxation (grade 1 diastolic dysfunction). The E/e&' ratio is between 8-15, suggesting indeterminate LV filling pressure. - Mitral valve: Mildly thickened leaflets . There was no significant regurgitation. - Left atrium: The atrium was normal in size.  Impressions:  - LVEF 60-65%, normal wall thickness, normal wall motion, diastolic dysfunction, normal LV filling pressure, normal  LA size.   Treatments: See Hospital Course  Discharge Exam: Blood pressure 123/69, pulse 51, temperature 98 F (36.7 C), temperature source Oral, resp. rate 17, height 5\' 8"  (1.727 m), weight 170 lb 3.1 oz (77.2 kg), SpO2 100 %.   Disposition:  01-Home or Self Care      Discharge Instructions    Diet - low sodium heart healthy    Complete by:  As directed      Discharge instructions    Complete by:  As directed   You will need to pick up heart monitor from cardiology office on 09/28/14 at 10:45. See further details in appointment section of discharge.     Increase activity slowly    Complete by:  As directed             Medication List    STOP taking these medications        lisinopril 5 MG tablet  Commonly known as:  PRINIVIL,ZESTRIL     metoprolol succinate 25 MG 24 hr tablet  Commonly known as:  TOPROL-XL      TAKE these medications        acetaminophen 500 MG tablet  Commonly known as:  TYLENOL  Take 500 mg by mouth every 4 (four) hours as needed for mild pain or moderate pain (pain).     alum & mag hydroxide-simeth 200-200-20 MG/5ML suspension  Commonly known as:  MAALOX/MYLANTA  Take 30 mLs by mouth 4 (four) times daily as needed for indigestion or heartburn.     aspirin 81 MG chewable tablet  Chew 81 mg by mouth daily.     cholecalciferol 1000 UNITS tablet  Commonly known as:  VITAMIN D  Take 1,000 Units by mouth daily.     divalproex 125 MG capsule  Commonly known as:  DEPAKOTE SPRINKLE  Take 125 mg by mouth 2 (two) times daily.     donepezil 10 MG tablet  Commonly known as:  ARICEPT  Take 10 mg by mouth at bedtime.     guaifenesin 100 MG/5ML syrup  Commonly known as:  ROBITUSSIN  Take 200 mg by mouth 3 (three) times daily as needed for cough.     haloperidol 0.5 MG tablet  Commonly known as:  HALDOL  Take 0.5 mg by mouth every 4 (four) hours as needed for agitation.     loperamide 2 MG capsule  Commonly known as:  IMODIUM  Take by mouth as needed for diarrhea or loose stools.     magnesium hydroxide 400 MG/5ML suspension  Commonly known as:  MILK OF MAGNESIA  Take 30 mLs by mouth daily as needed for mild constipation.     mirtazapine 30 MG tablet  Commonly known as:  REMERON    Take 30 mg by mouth at bedtime.     neomycin-bacitracin-polymyxin [phone] ointment  Apply 1 application topically daily as needed (for skin tears,abrasions,minor irritation).     tamsulosin 0.4 MG Caps capsule  Commonly known as:  FLOMAX  Take 1 capsule (0.4 mg total) by mouth daily.     traMADol 50 MG tablet  Commonly known as:  ULTRAM  Take 100 mg by mouth every 6 (six) hours as needed for moderate pain or severe pain (pain).       Follow-up Information    Follow up with Crist FatHERRICK, BENJAMIN W, MD On 10/04/2014.   Specialty:  Urology   Why:  8:30 am for foley removal   Contact information:   [address] [place] [address] [phone]       Follow up with [facility] On 09/27/2014.   Specialty:  Cardiology   Why:  10:45 am to pick up heart monitor   Contact information:   [address] [place] [address] [phone]      Follow up with Wilburt Finlay, PA-C On 10/21/2014.   Specialty:  Physician Assistant   Why:  11:00 am (cardiology follow-up)   Contact information:   [address] [place] [address] [phone]       TIME SPENT ON DISCHARGE, INCLUDING PHYSICIAN TIME: >30 MINUTES  Signed: Robbie Lis 09/27/2014, 10:50 AM

## 2014-09-27 NOTE — Progress Notes (Signed)
Pt discharged to SNF Discharge instructions given to EMS  IV dc'd  Tele dc'd  Pt discharged via EMS, all pt belongs sent to SNF with patient. Report given to RN at facility.  Darrel HooverWilson,Poet Hineman S 3:56 PM

## 2014-09-27 NOTE — Clinical Social Work Note (Signed)
Patient to be d/c'ed today to [facility].  Patient and family agreeable to plans will transport via ems RN to call report.  Leslieanne Cobarrubias, MSW, LCSWA [phone]  

## 2014-09-27 NOTE — Progress Notes (Addendum)
PA paged for clarification of discharge instruction. Jacob York,Jacob York S 11:10 AM   No return page, paged again. 11:27 AM

## 2014-09-28 ENCOUNTER — Telehealth: Payer: Self-pay | Admitting: Physician Assistant

## 2014-09-28 NOTE — Telephone Encounter (Signed)
Close encounter 

## 2014-09-29 ENCOUNTER — Non-Acute Institutional Stay (SKILLED_NURSING_FACILITY): Payer: Medicare Other | Admitting: Internal Medicine

## 2014-09-29 ENCOUNTER — Encounter: Payer: Self-pay | Admitting: Internal Medicine

## 2014-09-29 DIAGNOSIS — R339 Retention of urine, unspecified: Secondary | ICD-10-CM

## 2014-09-29 DIAGNOSIS — F0391 Unspecified dementia with behavioral disturbance: Secondary | ICD-10-CM

## 2014-09-29 DIAGNOSIS — N183 Chronic kidney disease, stage 3 unspecified: Secondary | ICD-10-CM

## 2014-09-29 DIAGNOSIS — R001 Bradycardia, unspecified: Secondary | ICD-10-CM

## 2014-09-29 DIAGNOSIS — N189 Chronic kidney disease, unspecified: Secondary | ICD-10-CM

## 2014-09-29 DIAGNOSIS — I10 Essential (primary) hypertension: Secondary | ICD-10-CM

## 2014-09-29 NOTE — Progress Notes (Signed)
Patient ID: Jacob York, male   DOB: 16-Sep-1936, 78 y.o.   MRN: [id number]   This is an acute visit.  Level of care skilled.  Facility [facility].  Chief complaint-acute visit status post hospitalization for bradycardia.-Syncope  History of present illness  Patient is a very pleasant 78 year old male with significant dementia-.  He was admitted to the hospital on December 16 for syncope with bradycardia.  He was residing at an assisted living facility and was found unresponsive there in his wheelchair.  He was noted to be pale and diaphoretic-on arrival to the ER he was more alert-telemetry showed ventricular bigeminy with bradycardia-heart rate initially was in the 30s but improved to the low 50s-with stable blood pressure.  Troponin was normal as well as TSH phosphorus and magnesium.  His beta blocker was held on admission to the hospital he was monitored cardiac enzymes were negative-.  The 2-D echo did show normal ejection fraction at 6065% with grade 1 diastolic dysfunction.  He continued to have some bradycardia off his beta blocker with rates in the upper 50s to 60s but no severe bradycardia.  There was no further syncope.  However he did have frequent PVCs ventricular bigeminy off his beta blocker.  Decision was made to continue to hold the beta blocker with recommendation for further evaluation as an outpatient with a 2 week about monitor.  Patient also experienced some difficulty voiding during his hospitalization-he was noted to have urinary retention greater than 500 mL via bladder scan-a catheter was placed with follow-up recommended by urology.--He was also started on Flomax  Since patient had a urinary catheter he cannot be discharged back to his assisted living in this he has been placed for short-term placement here in skilled nursing.  So far his stay here has been quite unremarkable-at times he will disconnect his catheter but doesn't nap nursing staff does  not report any issues-he appears to continue to have significant dementia but is pleasant and cooperative.  He does not report any shortness of breath chest pain abdominal discomfort or GU discomfort this evening.  Previous medical history.  Dementia-syncope with bradycardia.  Hypertension.  Chronic renal insufficiency stage III.  Urinary retention.  History of CVA.  History of hypertension.  Previous surgical history he has had an inguinal hernia repair with mesh  Family medical social history as been reviewed per discharge summary on 09/27/2014 an H&P on 09/21/2014-of note he was living in an assisted living facility previously--appears no history of alcohol or tobacco use. Or use of illicit drugs  Medications.  Guaifenesin 10 mL every 8 hours when necessary cough.  Tramadol 100 mg every 6 hours when necessary moderate or severe pain.  Haldol 0.5 mg every 4 hours when necessary agitation Tylenol 500 mg every 4 hours when necessary mild to moderate pain.  Maalox 30 mL every 6 hours when necessary indigestion or heartburn.  Depakote 125 mg twice a day.  Flomax 0.4 mg daily.  Aspirin 81 mg daily.  Vitamin D 1000 units daily.  Aricept 10 mg by mouth daily Remeron 30 mg daily at bedtime.  Remeron 30 mg by mouth daily at bedtime.  Review of systems this is quite limited secondary to dementia.--Provided by patient and nursing staff  General no complaints of fever or chills.  Skin does not complain of rashes or itching.  Head ears eyes nose mouth and throat-no complaint of visual changes or sore throat.  Respiratory denies any shortness of breath or cough.  Cardiac no chest pain does have some history of bradycardia.  GI does not complain of any abdominal discomfort nausea or vomiting diarrhea or constipation.  GU history as noted above does not complain of dysuria or burning with urination.  Musculoskeletal is not complaining of any joint pain he is  ambulatory.  Neurologic no complaints of dizziness or headache or syncopal-type feelings during his stay here.  Psych appears to have significant dementia apparently with some history of agitation although nursing staff does not really report much agitation so far.  Physical exam.  Temperature is 98.3 pulse 79 respirations 20 blood pressure 103/66-130/69 recently in this range.  General this is a pleasant but confused elderly male in no distress sitting comfortably in his chair.  His skin is warm and dry.  Eyes pupils appear reactive to light visual acuity appears grossly intact sclera and conjunctiva are clear.  Oropharynx is clear mucous membranes moist she has numerous extractions.  Chest is clear to auscultation no labored breathing.  Heart is regular rate and rhythm without murmur gallop or rub-he has minimal lower extremity edema pedal pulses are intact bilaterally.  Abdomen is soft nontender positive bowel sounds.  GU-he does have an indwelling Foley catheter although apparently he has disconnected the tubing from the bag -- there appears to be a moderate amount of amber colored urine in the bag- I do not note any suprapubic pain or distention--or any trauma noted to the penis area  Musculoskeletal I do not note any deformities moves all extremities 4 it appears with baseline strength and range of motion he is able to stand up and ambulate although he is slightly unsteady.  Neurologic is grossly intact no lateralizing findings grip strength is strong bilaterally cranial nerves intact speech is clear.  Psych he is oriented to self only this pleasantly confused cannot really tell me the day of the week or the year or what his living situation is-stated he lived with his parents and then said no that's not right could not really tell me where he was living  Labs.  09/22/2014.  Sodium 142 potassium 4 BUN 18 creatinine 1.31.  WBC 5.8 hemoglobin 12.8 platelets  204.  09/21/2014.  Hemoglobin A1c 5.5.  Magnesium 1.9.  TSH--1.78.  Liver function tests within normal limits except albumin of 3.4  Assessment and plan.  #1-history of bradycardia-syncope-patient's beta blocker has been DC'd-there is follow-up with cardiology scheduled for an event monitor-clinically appears stable pulses recently have run in the 60s 70s-blood pressure appears to be stable as well he has been asymptomatic at this point monitor await cardiology input.  #2-history of urinary retention-continues with a catheter-he continues on Flomax with urology follow-up scheduled-at this point appears stable although patient at times will disconnect his catheter.  #3-history of dementia this appears to be quite significant-he does have when necessary Haldol but so far nursing staff appears to have not noticed much agitation-this will warrant monitoring.--He continues on Aricept-this can cause bradycardia as well but this appears to be stable despite being back on the Aricept.--  I note he is also on Depakote I suspect this is for behaviors and mood stabilization-will update a Depakote level next week.  4-depression?-He is on Remeron.  5-history of hypertension-again this appears stable despite being off the beta blocker    #6-pain management-this appears to be stable he is on Tylenol as well as tramadol if needed for more significant pain.  #7 history CVA he does continue on aspirin.  #8-history of  renal insufficiency-per lab review it appears this is been relatively baseline with creatinine around 1.3-update metabolic panel next laboratory day  Of note Will update a CBC a metabolic panel and Depakote level  nxtlaboratory day  C [id number]-of note greater than 40 minutes spent assessing patient-reviewing his medical chart-and coordinating and formulating a plan of care for numerous diagnoses-of note greater than 50% of time spent coordinating plan of care

## 2014-10-04 ENCOUNTER — Non-Acute Institutional Stay (SKILLED_NURSING_FACILITY): Payer: Medicare Other | Admitting: Internal Medicine

## 2014-10-04 DIAGNOSIS — F03918 Unspecified dementia, unspecified severity, with other behavioral disturbance: Secondary | ICD-10-CM

## 2014-10-04 DIAGNOSIS — N183 Chronic kidney disease, stage 3 unspecified: Secondary | ICD-10-CM

## 2014-10-04 DIAGNOSIS — N189 Chronic kidney disease, unspecified: Secondary | ICD-10-CM

## 2014-10-04 DIAGNOSIS — I499 Cardiac arrhythmia, unspecified: Secondary | ICD-10-CM

## 2014-10-04 DIAGNOSIS — R339 Retention of urine, unspecified: Secondary | ICD-10-CM

## 2014-10-04 DIAGNOSIS — I498 Other specified cardiac arrhythmias: Secondary | ICD-10-CM

## 2014-10-04 DIAGNOSIS — R001 Bradycardia, unspecified: Secondary | ICD-10-CM

## 2014-10-04 DIAGNOSIS — F0391 Unspecified dementia with behavioral disturbance: Secondary | ICD-10-CM

## 2014-10-04 NOTE — Progress Notes (Signed)
MRN: [id number] Name: Jacob York  Sex: male Age: 78 y.o. DOB: [birth year]  PSC #:[facility]  Facility/Room: 101 Level Of Care: SNF Provider: Merrilee Seashore D Emergency Contacts: Extended Emergency Contact Information Primary Emergency Contact: Gosdin,[name] Address: [address]           [place], [place] [address] [place] Home Phone: [phone] Relation: Son   Allergies: Review of patient's allergies indicates no known allergies.  Chief Complaint  Patient presents with  . New Admit To SNF    HPI: Patient is 78 y.o. male who was admitted to hospital fro ALF with bradycardia 2/2 metoprolol, who was also found to have significant urinary retention requiring a foley, admitted to SNF for care of that and for OT/PT 2/2 generalized weakness.  Past Medical History  Diagnosis Date  . Hypertension   . Stroke   . Dementia   . Urinary retention 09/22/2014    Past Surgical History  Procedure Laterality Date  . Inguinal hernia repair N/A 06/20/2014    Procedure: LAPAROSCOPIC BILATERAL INGUINAL HERNIA REPAIR, LEFT FEMORAL AND INCARCERATED SUPRA UMBILICAL HERNIA REPAIR WITH MESH;  Surgeon: Karie Soda, MD;  Location: [facility];  Service: General;  Laterality: N/A;  . Insertion of mesh N/A 06/20/2014    Procedure: INSERTION OF MESH;  Surgeon: Karie Soda, MD;  Location: [facility];  Service: General;  Laterality: N/A;      Medication List       This list is accurate as of: 10/04/14 11:59 PM.  Always use your most recent med list.               acetaminophen 500 MG tablet  Commonly known as:  TYLENOL  Take 500 mg by mouth every 4 (four) hours as needed for mild pain or moderate pain (pain).     alum & mag hydroxide-simeth 200-200-20 MG/5ML suspension  Commonly known as:  MAALOX/MYLANTA  Take 30 mLs by mouth 4 (four) times daily as needed for indigestion or heartburn.     aspirin 81 MG chewable tablet  Chew 81 mg by mouth daily.     cholecalciferol 1000 UNITS  tablet  Commonly known as:  VITAMIN D  Take 1,000 Units by mouth daily.     divalproex 125 MG capsule  Commonly known as:  DEPAKOTE SPRINKLE  Take 125 mg by mouth 2 (two) times daily.     donepezil 10 MG tablet  Commonly known as:  ARICEPT  Take 10 mg by mouth at bedtime.     guaifenesin 100 MG/5ML syrup  Commonly known as:  ROBITUSSIN  Take 200 mg by mouth 3 (three) times daily as needed for cough.     haloperidol 0.5 MG tablet  Commonly known as:  HALDOL  Take 0.5 mg by mouth every 4 (four) hours as needed for agitation.     loperamide 2 MG capsule  Commonly known as:  IMODIUM  Take by mouth as needed for diarrhea or loose stools.     magnesium hydroxide 400 MG/5ML suspension  Commonly known as:  MILK OF MAGNESIA  Take 30 mLs by mouth daily as needed for mild constipation.     mirtazapine 30 MG tablet  Commonly known as:  REMERON  Take 30 mg by mouth at bedtime.     neomycin-bacitracin-polymyxin [phone] ointment  Apply 1 application topically daily as needed (for skin tears,abrasions,minor irritation).     tamsulosin 0.4 MG Caps capsule  Commonly known as:  FLOMAX  Take 1  capsule (0.4 mg total) by mouth daily.     traMADol 50 MG tablet  Commonly known as:  ULTRAM  Take 100 mg by mouth every 6 (six) hours as needed for moderate pain or severe pain (pain).        No orders of the defined types were placed in this encounter.    Immunization History  Administered Date(s) Administered  . Influenza,inj,Quad PF,36+ Mos 06/21/2014  . Pneumococcal Polysaccharide-23 06/21/2014    History  Substance Use Topics  . Smoking status: Never Smoker   . Smokeless tobacco: Never Used  . Alcohol Use: No    Family history is noncontributory    Review of Systems  DATA OBTAINED: from patient, nurse GENERAL:  no fevers, fatigue, appetite changes SKIN: No itching, rash EYES: No eye pain, redness, discharge EARS: No earache, tinnitus, change in hearing NOSE: No  congestion, drainage or bleeding  MOUTH/THROAT: No mouth or tooth pain, No sore throat RESPIRATORY: No cough, wheezing, SOB CARDIAC: No chest pain, palpitations, lower extremity edema  GI: No abdominal pain, No N/V/D or constipation, No heartburn or reflux  GU: No dysuria, frequency or urgency, or incontinence  MUSCULOSKELETAL: No unrelieved bone/joint pain NEUROLOGIC: No headache, dizziness or focal weakness PSYCHIATRIC: No overt anxiety or sadness, No behavior issue.   Filed Vitals:   10/04/14 2056  BP: 105/64  Pulse: 70  Temp: 97 F (36.1 C)  Resp: 18    Physical Exam  GENERAL APPEARANCE: Alert,  No acute distress.  SKIN: No diaphoresis rash HEAD: Normocephalic, atraumatic  EYES: Conjunctiva/lids clear. Pupils round, reactive. EOMs intact.  EARS: External exam WNL, canals clear. Hearing grossly normal.  NOSE: No deformity or discharge.  MOUTH/THROAT: Lips w/o lesions  RESPIRATORY: Breathing is even, unlabored. Lung sounds are clear   CARDIOVASCULAR: Heart RRR no murmurs, rubs or gallops. No peripheral edema.   GASTROINTESTINAL: Abdomen is soft, non-tender, not distended w/ normal bowel sounds. GENITOURINARY: Bladder non tender, not distended ; foley to SD MUSCULOSKELETAL: No abnormal joints or musculature NEUROLOGIC:  Cranial nerves 2-12 grossly intact. Moves all extremities  PSYCHIATRIC: dementia, no behavioral issues  Patient Active Problem List   Diagnosis Date Noted  . Urinary retention 09/22/2014  . Bigeminy 09/21/2014  . Syncope 09/21/2014  . Bradycardia 09/21/2014  . Chronic renal insufficiency, stage III (moderate) 09/21/2014  . Ventral hernia 06/20/2014  . Bilateral inguinal hernia (BIH) L>>R 05/24/2014  . Incarcerated ventral hernia - 4cm supraumbilical 05/24/2014  . Diastasis recti 05/24/2014  . Memory loss 05/24/2014  . Dementia with behavioral disturbance   . Hypertension     CBC    Component Value Date/Time   WBC 5.8 09/22/2014 0353   RBC 4.44  09/22/2014 0353   HGB 12.8* 09/22/2014 0353   HCT 38.3* 09/22/2014 0353   PLT 204 09/22/2014 0353   MCV 86.3 09/22/2014 0353   LYMPHSABS 1.0 09/21/2014 1355   MONOABS 0.7 09/21/2014 1355   EOSABS 0.1 09/21/2014 1355   BASOSABS 0.0 09/21/2014 1355    CMP     Component Value Date/Time   NA 142 09/22/2014 0353   K 4.0 09/22/2014 0353   CL 105 09/22/2014 0353   CO2 21 09/22/2014 0353   GLUCOSE 82 09/22/2014 0353   BUN 18 09/22/2014 0353   CREATININE 1.31 09/22/2014 0353   CALCIUM 9.6 09/22/2014 0353   PROT 6.3 09/21/2014 1355   ALBUMIN 3.4* 09/21/2014 1355   AST 15 09/21/2014 1355   ALT 9 09/21/2014 1355   ALKPHOS 70  09/21/2014 1355   BILITOT 0.4 09/21/2014 1355   GFRNONAA 51* 09/22/2014 0353   GFRAA 59* 09/22/2014 0353    Assessment and Plan  Bradycardia he was on BB therapy (25 mg of Toprol-XL daily). Per reports from EMS, he has received a dose at nursing facility prior to his event. His BB was held on admission and he was monitored on telemetry. Cardiac enzymes continued to be cycled and were negative x 3. 2D echo demonstrated normal EF at 60-65% with normal wall thickness and normal wall motion. He continued to have borderline bradycardia off of his BB with rates in the upper 50s-low 60s, but no recurrent severe bradycardia. His BP remained stable. He had no further syncope/ near syncope  Bigeminy . He did however have frequent PVCs/ ventricular bigeminy off of BB. However, decision was made to continue to hold off with further BB therapy. It has been recommended that he be further evaluated as an OP with a 2 week event monitor to assess ectopic burden and HR.  Urinary retention the patient endorsed difficulty voiding. He was also noted to have decreased urinary output. Bladder scan demonstrated urine retention with > 500 cc of urine. Subsequently, a coude catheter was placed. Urology recommended initiation of Flomax and trial removal of foley as an outpatient.  Dementia  with behavioral disturbance Continue aricept, depakote, remeron and prn haldol  Chronic renal insufficiency, stage III (moderate) Most recent BUN 26/Cr 1.1 with GFR >60    Margit HanksALEXANDER, Legacy Carrender D, MD

## 2014-10-10 ENCOUNTER — Encounter: Payer: Self-pay | Admitting: Internal Medicine

## 2014-10-10 NOTE — Assessment & Plan Note (Signed)
Most recent BUN 26/Cr 1.1 with GFR >60

## 2014-10-10 NOTE — Assessment & Plan Note (Signed)
he was on BB therapy (25 mg of Toprol-XL daily). Per reports from EMS, he has received a dose at nursing facility prior to his event. His BB was held on admission and he was monitored on telemetry. Cardiac enzymes continued to be cycled and were negative x 3. 2D echo demonstrated normal EF at 60-65% with normal wall thickness and normal wall motion. He continued to have borderline bradycardia off of his BB with rates in the upper 50s-low 60s, but no recurrent severe bradycardia. His BP remained stable. He had no further syncope/ near syncope

## 2014-10-10 NOTE — Assessment & Plan Note (Signed)
the patient endorsed difficulty voiding. He was also noted to have decreased urinary output. Bladder scan demonstrated urine retention with > 500 cc of urine. Subsequently, a coude catheter was placed. Urology recommended initiation of Flomax and trial removal of foley as an outpatient.

## 2014-10-10 NOTE — Assessment & Plan Note (Signed)
.   He did however have frequent PVCs/ ventricular bigeminy off of BB. However, decision was made to continue to hold off with further BB therapy. It has been recommended that he be further evaluated as an OP with a 2 week event monitor to assess ectopic burden and HR.

## 2014-10-10 NOTE — Assessment & Plan Note (Signed)
Continue aricept, depakote, remeron and prn haldol

## 2014-10-13 ENCOUNTER — Encounter: Payer: Self-pay | Admitting: Internal Medicine

## 2014-10-13 ENCOUNTER — Non-Acute Institutional Stay (SKILLED_NURSING_FACILITY): Payer: Medicare Other | Admitting: Internal Medicine

## 2014-10-13 DIAGNOSIS — F03918 Unspecified dementia, unspecified severity, with other behavioral disturbance: Secondary | ICD-10-CM

## 2014-10-13 DIAGNOSIS — N183 Chronic kidney disease, stage 3 unspecified: Secondary | ICD-10-CM

## 2014-10-13 DIAGNOSIS — N189 Chronic kidney disease, unspecified: Secondary | ICD-10-CM

## 2014-10-13 DIAGNOSIS — I10 Essential (primary) hypertension: Secondary | ICD-10-CM

## 2014-10-13 DIAGNOSIS — R339 Retention of urine, unspecified: Secondary | ICD-10-CM

## 2014-10-13 DIAGNOSIS — R001 Bradycardia, unspecified: Secondary | ICD-10-CM

## 2014-10-13 DIAGNOSIS — F0391 Unspecified dementia with behavioral disturbance: Secondary | ICD-10-CM

## 2014-10-13 NOTE — Progress Notes (Signed)
Patient ID: Jacob York, male   DOB: 1936/02/02, 79 y.o.   MRN: [id number]   Patient ID: Jacob York, male   DOB: 1936/03/25, 79 y.o.   MRN: [id number]   This is an acute visit.  Level of care skilled.  Facility [facility].  Chief complaint-acute visit status post hospitalization for bradycardia.-Syncope  History of present illness  Patient is a very pleasant 79 year old male with significant dementia-.  He was admitted to the hospital on December 16 for syncope with bradycardia.  He was residing at an assisted living facility and was found unresponsive there in his wheelchair.  He was noted to be pale and diaphoretic-on arrival to the ER he was more alert-telemetry showed ventricular bigeminy with bradycardia-heart rate initially was in the 30s but improved to the low 50s-with stable blood pressure.  Troponin was normal as well as TSH phosphorus and magnesium.  His beta blocker was held on admission to the hospital he was monitored cardiac enzymes were negative-.  The 2-D echo did show normal ejection fraction at 6065% with grade 1 diastolic dysfunction.  He continued to have some bradycardia off his beta blocker with rates in the upper 50s to 60s but no severe bradycardia.  There was no further syncope.  However he did have frequent PVCs ventricular bigeminy off his beta blocker.  Decision was made to continue to hold the beta blocker with recommendation for further evaluation as an outpatient with a 2 week about monitor.  Patient also experienced some difficulty voiding during his hospitalization-he was noted to have urinary retention greater than 500 mL via bladder scan-a catheter was placed with follow-up recommended by urology.--He was also started on Flomax  Since patient had a urinary catheter he cannot be discharged back to his assisted living in this he has been placed for short-term placement here in skilled nursing.  So far his stay here has been quite  unremarkable-at times he will disconnect his catheter but doesn't nap nursing staff does not report any issues-he appears to continue to have significant dementia but is pleasant and cooperative.  He does not report any shortness of breath chest pain abdominal discomfort or GU discomfort this evening.  Previous medical history.  Dementia-syncope with bradycardia.  Hypertension.  Chronic renal insufficiency stage III.  Urinary retention.  History of CVA.  History of hypertension.  Previous surgical history he has had an inguinal hernia repair with mesh  Family medical social history as been reviewed per discharge summary on 09/27/2014 an H&P on 09/21/2014-of note he was living in an assisted living facility previously--appears no history of alcohol or tobacco use. Or use of illicit drugs  Medications.  Guaifenesin 10 mL every 8 hours when necessary cough.  Tramadol 100 mg every 6 hours when necessary moderate or severe pain.  Haldol 0.5 mg every 4 hours when necessary agitation Tylenol 500 mg every 4 hours when necessary mild to moderate pain.  Maalox 30 mL every 6 hours when necessary indigestion or heartburn.  Depakote 125 mg twice a day.  Flomax 0.4 mg daily.  Aspirin 81 mg daily.  Vitamin D 1000 units daily.  Aricept 10 mg by mouth daily Remeron 30 mg daily at bedtime.  Remeron 30 mg by mouth daily at bedtime.  Review of systems this is quite limited secondary to dementia.--Provided by patient and nursing staff  General no complaints of fever or chills.  Skin does not complain of rashes or itching.  Head ears eyes nose mouth and  throat-no complaint of visual changes or sore throat.  Respiratory denies any shortness of breath or cough.  Cardiac no chest pain does have some history of bradycardia.  GI does not complain of any abdominal discomfort nausea or vomiting diarrhea or constipation.  GU history as noted above does not complain of dysuria or burning  with urination.  Musculoskeletal is not complaining of any joint pain he is ambulatory.  Neurologic no complaints of dizziness or headache or syncopal-type feelings during his stay here.  Psych appears to have significant dementia apparently with some history of agitation although nursing staff does not really report much agitation so far.  Physical exam.  Temperature is 98.3 pulse 79 respirations 20 blood pressure 103/66-130/69 recently in this range.  General this is a pleasant but confused elderly male in no distress sitting comfortably in his chair.  His skin is warm and dry.  Eyes pupils appear reactive to light visual acuity appears grossly intact sclera and conjunctiva are clear.  Oropharynx is clear mucous membranes moist she has numerous extractions.  Chest is clear to auscultation no labored breathing.  Heart is regular rate and rhythm without murmur gallop or rub-he has minimal lower extremity edema pedal pulses are intact bilaterally.  Abdomen is soft nontender positive bowel sounds.  GU-he does have an indwelling Foley catheter although apparently he has disconnected the tubing from the bag -- there appears to be a moderate amount of amber colored urine in the bag- I do not note any suprapubic pain or distention--or any trauma noted to the penis area  Musculoskeletal I do not note any deformities moves all extremities 4 it appears with baseline strength and range of motion he is able to stand up and ambulate although he is slightly unsteady.  Neurologic is grossly intact no lateralizing findings grip strength is strong bilaterally cranial nerves intact speech is clear.  Psych he is oriented to self only this pleasantly confused cannot really tell me the day of the week or the year or what his living situation is-stated he lived with his parents and then said no that's not right could not really tell me where he was living  Labs.  09/22/2014.  Sodium 142 potassium  4 BUN 18 creatinine 1.31.  WBC 5.8 hemoglobin 12.8 platelets 204.  09/21/2014.  Hemoglobin A1c 5.5.  Magnesium 1.9.  TSH--1.78.  Liver function tests within normal limits except albumin of 3.4  Assessment and plan.  #1-history of bradycardia-syncope-patient's beta blocker has been DC'd-there is follow-up with cardiology scheduled for an event monitor-clinically appears stable pulses recently have run in the 60s 70s-blood pressure appears to be stable as well he has been asymptomatic at this point monitor await cardiology input.  #2-history of urinary retention-continues with a catheter-he continues on Flomax with urology follow-up scheduled-at this point appears stable although patient at times will disconnect his catheter.  #3-history of dementia this appears to be quite significant-he does have when necessary Haldol but so far nursing staff appears to have not noticed much agitation-this will warrant monitoring.--He continues on Aricept-this can cause bradycardia as well but this appears to be stable despite being back on the Aricept.--  I note he is also on Depakote I suspect this is for behaviors and mood stabilization-will update a Depakote level next week.  4-depression?-He is on Remeron.  5-history of hypertension-again this appears stable despite being off the beta blocker    #6-pain management-this appears to be stable he is on Tylenol as well as tramadol  if needed for more significant pain.  #7 history CVA he does continue on aspirin.  #8-history of renal insufficiency-per lab review it appears this is been relatively baseline with creatinine around 1.3-update metabolic panel next laboratory day  Of note Will update a CBC a metabolic panel and Depakote level  nxtlaboratory day  C [id number]-of note greater than 40 minutes spent assessing patient-reviewing his medical chart-and coordinating and formulating a plan of care for numerous diagnoses-of note greater than 50% of  time spent coordinating plan of care         This encounter was created in error - please disregard.

## 2014-10-13 NOTE — Progress Notes (Signed)
Patient ID: Jacob York, male   DOB: [birth year], 79 y.o.   MRN: [id number]  :            This is an a discharge note.  Level of care skilled.  Facility [facility].  Chief complaint-Discharge note  History of present illness  Patient is a very pleasant 79 year old male with significant dementia-.  He was admitted to the hospital on December 16 for syncope with bradycardia.  He was residing at an assisted living facility and was found unresponsive there in his wheelchair.  He was noted to be pale and diaphoretic-on arrival to the ER he was more alert-telemetry showed ventricular bigeminy with bradycardia-heart rate initially was in the 30s but improved to the low 50s-with stable blood pressure.  Troponin was normal as well as TSH phosphorus and magnesium.  His beta blocker was held on admission to the hospital he was monitored cardiac enzymes were negative-.  The 2-D echo did show normal ejection fraction at 6065% with grade 1 diastolic dysfunction.  He continued to have some bradycardia off his beta blocker with rates in the upper 50s to 60s but no severe bradycardia.  There was no further syncope.  However he did have frequent PVCs ventricular bigeminy off his beta blocker.  Decision was made to continue to hold the beta blocker with recommendation for further evaluation as an outpatient with a 2 week event monitor-this will need follow once patient is discharged   It appears recent pulses have ranged from  60-90s blood pressures have been stable 130/65-120/70 most recently.  Patient also experienced some difficulty voiding during his hospitalization-he was noted to have urinary retention greater than 500 mL via bladder scan-a catheter was placed with follow-up recommended by urology.--He was also started on Flomax--he has seen urology and Foley catheterhass been removed-his Flomax was doubled to 0.8 mg a day-he was also put on ciprofloxacin for 7 days empirically this  will end on January 11     nursing staff does not report any issues-he appears to continue to have significant dementia but is pleasant and cooperative most of the time-he does have wandering episodes and occasionally will be agitated but apparently is redirected-at one point he had been on Haldol but this has been discontinued.  He does not report any shortness of breath chest pain abdominal discomfort or GU discomfort this afternoon.  He is scheduled for discharge next week on January 12-he will be going to a memory care unit which I feel is suitable for him-he will need continued nursing support for his medical issues including urinary issues and cardiac issues-as well as continued PT and OT for strengthening  Also will need follow-up by cardiology for the  event monitor  Previous medical history.  Dementia-syncope with bradycardia.  Hypertension.  Chronic renal insufficiency stage III.  Urinary retention.  History of CVA.  History of hypertension.  Previous surgical history he has had an inguinal hernia repair with mesh  Family medical social history as been reviewed per discharge summary on 09/27/2014 an H&P on 09/21/2014-of note he was living in an assisted living facility previously--appears no history of alcohol or tobacco use. Or use of illicit drugs  Medications.  Guaifenesin 10 mL every 8 hours when necessary cough.  Tramadol 100 mg every 6 hours when necessary moderate or severe pain.   .  Maalox 30 mL every 6 hours when necessary indigestion or heartburn.  Depakote 125 mg twice a day.  Flomax 0.8 mg daily.  Aspirin 81 mg daily.  Vitamin D 1000 units daily.  Aricept 10 mg by mouth daily   Remeron 30 mg daily at bedtime.  Remeron 30 mg by mouth daily at bedtime.   Review of systems this is quite limited secondary to dementia.--Provided by patient and nursing staff  General no complaints of fever or chills.  Skin does not complain of rashes or  itching.  Head ears eyes nose mouth and throat-no complaint of visual changes or sore throat.  Respiratory denies any shortness of breath or cough.  Cardiac no chest pain does have some history of bradycardia.  GI does not complain of any abdominal discomfort nausea or vomiting diarrhea or constipation.  GU history as noted above does not complain of dysuria or burning with urination--apparently he is voiding per nursing.  Musculoskeletal is not complaining of any joint pain he is ambulatory.  Neurologic no complaints of dizziness or headache or syncopal-type feelings during his stay here.  Psych appears to have significant dementia apparently with some history of agitation although nursing staff does not really report much agitation  Physical exam.   Temperature 97.8 pulse 70 respirations 18 blood pressure 130/65 all this appears relatively baseline  General this is a pleasant but confused elderly male in no distress .  His skin is warm and dry.  Eyes pupils appear reactive to light visual acuity appears grossly intact sclera and conjunctiva are clear.  Oropharynx is clear mucous membranes moist she has numerous extractions.  Chest is clear to auscultation no labored breathing.  Heart is regular rate and rhythm with occasional irregular beats without murmur gallop or rub-he has minimal lower extremity edema   Abdomen is soft nontender positive bowel sounds.  GU-did not appreciate any suprapubic distention or pain-Foley catheter has been removed   Musculoskeletal I do not note any deformities moves all extremities 4 it appears with baseline strength and range of motion he is able to stand up and ambulate although he is slightly unsteady.  Neurologic is grossly intact no lateralizing findings grip strength is strong bilaterally cranial nerves intact speech is clear.  Psych he is oriented to self only --pleasantly confused-conversational   Labs  10/03/2014.  Sodium  141 potassium 3.9 BUN 26 creatinine 1.1.  Valproic acid 17.6.  WBC 7.4 hemoglobin 12.6 platelets 200.  09/22/2014.  Sodium 142 potassium 4 BUN 18 creatinine 1.31.  WBC 5.8 hemoglobin 12.8 platelets 204.  09/21/2014.  Hemoglobin A1c 5.5.  Magnesium 1.9.  TSH--1.78.  Liver function tests within normal limits except albumin of 3.4  Assessment and plan.  #1-history of bradycardia-syncope-patient's beta blocker has been DC'd- follow-up with cardiology scheduled for an event monitor-clinically appears stable pulses recently have run 60-90's-blood pressure appears to be stable as well  Will write an order for cardiology follow-up for event monitor  #2-history of urinary retention- This has been followed closely by urology-he is on a short course of ciprofloxacin empirically-also on Flomax which was recently increased by urology-Foley catheter has been removed.--Recent bladder scan apparently was non-concerning  #3-history of dementia this appears to be quite significant-he did have when necessary Haldol but so far nursing staff appears to have not noticed much agitation this has been discontinued- --He continues on Aricept-this can cause bradycardia as well but this appears to be stable despite being back on the Aricept.-- He will be discharged to a memory care unit which I feel is appropriate  I note he is also on Depakote.  4-depression?-He is on Remeron.  5-history of hypertension-again this appears stable despite being off the beta blocker    #6-pain management-this appears to be stable he is on Tylenol as well as tramadol if needed for more significant pain.  #7 history CVA he does continue on aspirin.  #8-history of renal insufficiency-per lab review it appears this is been relatively baseline with creatinine around 1.3-update metabolic panel on December 28 showed creatinine of 1.1 BUN of 26  Again he will need continued PT and OT for strengthening as well as nursing  support for his numerous medical issues  Also cardiology follow-up for history of bradycardia-it appears recommendation is for at some point an event monitor.  [id number] note greater than 30 minutes spent on this discharge summary

## 2014-10-15 ENCOUNTER — Non-Acute Institutional Stay (SKILLED_NURSING_FACILITY): Payer: Medicare Other | Admitting: Internal Medicine

## 2014-10-15 ENCOUNTER — Encounter: Payer: Self-pay | Admitting: Internal Medicine

## 2014-10-15 DIAGNOSIS — R55 Syncope and collapse: Secondary | ICD-10-CM | POA: Insufficient documentation

## 2014-10-15 NOTE — Progress Notes (Signed)
MRN: [id number] Name: Jacob York  Sex: male Age: 79 y.o. DOB: Jun 02, 1936  [facility]: [facility] Facility/Room: Level Of Care: SNF Provider: Merrilee SeashoreALEXANDER, ANNE York Emergency Contacts: Extended Emergency Contact Information Primary Emergency Contact: Jacob York,Jacob York Address: [address]           [place], [place] [id number] [place] Home Phone: [phone] Relation: Son    Allergies: Review of patient's allergies indicates no known allergies.  Chief Complaint  Patient presents with  . Acute Visit    HPI: Patient is 79 y.o. male who nursing asked me to see because pt got lightheaded while standing and slid down a wall onto his bottom on the floor.  Past Medical History  Diagnosis Date  . Hypertension   . Stroke   . Dementia   . Urinary retention 09/22/2014    Past Surgical History  Procedure Laterality Date  . Inguinal hernia repair N/A 06/20/2014    Procedure: LAPAROSCOPIC BILATERAL INGUINAL HERNIA REPAIR, LEFT FEMORAL AND INCARCERATED SUPRA UMBILICAL HERNIA REPAIR WITH MESH;  Surgeon: Karie SodaSteven Gross, MD;  Location: [facility];  Service: General;  Laterality: N/A;  . Insertion of mesh N/A 06/20/2014    Procedure: INSERTION OF MESH;  Surgeon: Karie SodaSteven Gross, MD;  Location: [facility];  Service: General;  Laterality: N/A;      Medication List       This list is accurate as of: 10/15/14 11:00 PM.  Always use your most recent med list.               acetaminophen 500 MG tablet  Commonly known as:  TYLENOL  Take 500 mg by mouth every 4 (four) hours as needed for mild pain or moderate pain (pain).     alum & mag hydroxide-simeth 200-200-20 MG/5ML suspension  Commonly known as:  MAALOX/MYLANTA  Take 30 mLs by mouth 4 (four) times daily as needed for indigestion or heartburn.     aspirin 81 MG chewable tablet  Chew 81 mg by mouth daily.     cholecalciferol 1000 UNITS tablet  Commonly known as:  VITAMIN York  Take 1,000 Units by mouth daily.     divalproex 125 MG capsule   Commonly known as:  DEPAKOTE SPRINKLE  Take 125 mg by mouth 2 (two) times daily.     donepezil 10 MG tablet  Commonly known as:  ARICEPT  Take 10 mg by mouth at bedtime.     guaifenesin 100 MG/5ML syrup  Commonly known as:  ROBITUSSIN  Take 200 mg by mouth 3 (three) times daily as needed for cough.     loperamide 2 MG capsule  Commonly known as:  IMODIUM  Take by mouth as needed for diarrhea or loose stools.     magnesium hydroxide 400 MG/5ML suspension  Commonly known as:  MILK OF MAGNESIA  Take 30 mLs by mouth daily as needed for mild constipation.     mirtazapine 30 MG tablet  Commonly known as:  REMERON  Take 30 mg by mouth at bedtime.     neomycin-bacitracin-polymyxin [phone] ointment  Apply 1 application topically daily as needed (for skin tears,abrasions,minor irritation).     tamsulosin 0.4 MG Caps capsule  Commonly known as:  FLOMAX  Take 1 capsule (0.4 mg total) by mouth daily.     traMADol 50 MG tablet  Commonly known as:  ULTRAM  Take 100 mg by mouth every 6 (six) hours as needed for moderate pain or severe pain (pain).  No orders of the defined types were placed in this encounter.    Immunization History  Administered Date(s) Administered  . Influenza,inj,Quad PF,36+ Mos 06/21/2014  . Pneumococcal Polysaccharide-23 06/21/2014    History  Substance Use Topics  . Smoking status: Never Smoker   . Smokeless tobacco: Never Used  . Alcohol Use: No    Review of Systems  DATA OBTAINED: from patient, nurse, medical record GENERAL:  no fevers, fatigue, appetite changes SKIN: No itching, rash HEENT: No complaint RESPIRATORY: No cough, wheezing, SOB CARDIAC: No chest pain, palpitations, lower extremity edema  GI: No abdominal pain, No N/V/York or constipation, No heartburn or reflux  GU: No dysuria, frequency or urgency, or incontinence  MUSCULOSKELETAL: No unrelieved bone/joint pain NEUROLOGIC: No headache, lightheaded, getting better ;was  standing at the desk, felt himself getting lightheaded and thought "I'll just tough this out" PSYCHIATRIC: No overt anxiety or sadness  Filed Vitals:   10/15/14 1810  BP: 93/60  Pulse: 50  Temp: 97.5 F (36.4 C)  Resp: 20    Physical Exam  GENERAL APPEARANCE: Alert, conversant, No acute distress sitting in WC then able to walk a few steps to the chair SKIN: + diaphoresis,+ pallor HEENT: Unremarkable RESPIRATORY: Breathing is even, unlabored. Lung sounds are clear   CARDIOVASCULAR: Heart RRR no murmurs, rubs or gallops. No peripheral edema  GASTROINTESTINAL: Abdomen is soft, non-tender, not distended w/ normal bowel sounds.  GENITOURINARY: Bladder non tender, not distended  MUSCULOSKELETAL: No abnormal joints or musculature NEUROLOGIC: Cranial nerves 2-12 grossly intact. Moves all extremities PSYCHIATRIC: Mood and affect appropriate to situation, no behavioral issues  Patient Active Problem List   Diagnosis Date Noted  . Pre-syncope 10/15/2014  . Urinary retention 09/22/2014  . Bigeminy 09/21/2014  . Syncope 09/21/2014  . Bradycardia 09/21/2014  . Chronic renal insufficiency, stage III (moderate) 09/21/2014  . Ventral hernia 06/20/2014  . Bilateral inguinal hernia (BIH) L>>R 05/24/2014  . Incarcerated ventral hernia - 4cm supraumbilical 05/24/2014  . Diastasis recti 05/24/2014  . Memory loss 05/24/2014  . Dementia with behavioral disturbance   . Hypertension     CBC    Component Value Date/Time   WBC 5.8 09/22/2014 0353   RBC 4.44 09/22/2014 0353   HGB 12.8* 09/22/2014 0353   HCT 38.3* 09/22/2014 0353   PLT 204 09/22/2014 0353   MCV 86.3 09/22/2014 0353   LYMPHSABS 1.0 09/21/2014 1355   MONOABS 0.7 09/21/2014 1355   EOSABS 0.1 09/21/2014 1355   BASOSABS 0.0 09/21/2014 1355    CMP     Component Value Date/Time   NA 142 09/22/2014 0353   K 4.0 09/22/2014 0353   CL 105 09/22/2014 0353   CO2 21 09/22/2014 0353   GLUCOSE 82 09/22/2014 0353   BUN 18  09/22/2014 0353   CREATININE 1.31 09/22/2014 0353   CALCIUM 9.6 09/22/2014 0353   PROT 6.3 09/21/2014 1355   ALBUMIN 3.4* 09/21/2014 1355   AST 15 09/21/2014 1355   ALT 9 09/21/2014 1355   ALKPHOS 70 09/21/2014 1355   BILITOT 0.4 09/21/2014 1355   GFRNONAA 51* 09/22/2014 0353   GFRAA 59* 09/22/2014 0353    Assessment and Plan  Pre-syncope Pt had an episode if lightheadedness. He felt it coming and it is resolving now. H/o same prior per pt. Per records pt had bradycardia in the hospital and HR is 50 now. Will need monitoring now and at home for the possibilty of pacing in the future.    Margit Hanks, MD

## 2014-10-15 NOTE — Assessment & Plan Note (Signed)
Pt had an episode if lightheadedness. He felt it coming and it is resolving now. H/o same prior per pt. Per records pt had bradycardia in the hospital and HR is 50 now. Will need monitoring now and at home for the possibilty of pacing in the future.

## 2014-10-21 ENCOUNTER — Ambulatory Visit: Payer: Self-pay | Admitting: Physician Assistant

## 2014-10-24 ENCOUNTER — Ambulatory Visit: Payer: Self-pay | Admitting: Physician Assistant

## 2014-11-21 ENCOUNTER — Encounter (HOSPITAL_COMMUNITY): Payer: Self-pay | Admitting: *Deleted

## 2014-11-21 ENCOUNTER — Emergency Department (HOSPITAL_COMMUNITY)
Admission: EM | Admit: 2014-11-21 | Discharge: 2014-11-22 | Disposition: A | Discharge status: Home / Self Care | Payer: Medicare Other | Attending: Emergency Medicine | Admitting: Emergency Medicine

## 2014-11-21 ENCOUNTER — Emergency Department (HOSPITAL_COMMUNITY): Payer: Medicare Other

## 2014-11-21 DIAGNOSIS — Z7982 Long term (current) use of aspirin: Secondary | ICD-10-CM | POA: Diagnosis not present

## 2014-11-21 DIAGNOSIS — E860 Dehydration: Secondary | ICD-10-CM

## 2014-11-21 DIAGNOSIS — Z8673 Personal history of transient ischemic attack (TIA), and cerebral infarction without residual deficits: Secondary | ICD-10-CM | POA: Diagnosis not present

## 2014-11-21 DIAGNOSIS — I959 Hypotension, unspecified: Secondary | ICD-10-CM | POA: Diagnosis not present

## 2014-11-21 DIAGNOSIS — I10 Essential (primary) hypertension: Secondary | ICD-10-CM | POA: Diagnosis not present

## 2014-11-21 DIAGNOSIS — F0390 Unspecified dementia without behavioral disturbance: Secondary | ICD-10-CM | POA: Diagnosis not present

## 2014-11-21 DIAGNOSIS — R109 Unspecified abdominal pain: Secondary | ICD-10-CM | POA: Diagnosis not present

## 2014-11-21 DIAGNOSIS — Z79899 Other long term (current) drug therapy: Secondary | ICD-10-CM | POA: Insufficient documentation

## 2014-11-21 DIAGNOSIS — R404 Transient alteration of awareness: Secondary | ICD-10-CM

## 2014-11-21 DIAGNOSIS — R4182 Altered mental status, unspecified: Secondary | ICD-10-CM | POA: Diagnosis present

## 2014-11-21 LAB — CBC WITH DIFFERENTIAL/PLATELET
BASOS ABS: 0 10*3/uL (ref 0.0–0.1)
BASOS PCT: 0 % (ref 0–1)
EOS ABS: 0 10*3/uL (ref 0.0–0.7)
Eosinophils Relative: 0 % (ref 0–5)
HCT: 45.1 % (ref 39.0–52.0)
Hemoglobin: 15.1 g/dL (ref 13.0–17.0)
Lymphocytes Relative: 7 % — ABNORMAL LOW (ref 12–46)
Lymphs Abs: 0.7 10*3/uL (ref 0.7–4.0)
MCH: 29.9 pg (ref 26.0–34.0)
MCHC: 33.5 g/dL (ref 30.0–36.0)
MCV: 89.3 fL (ref 78.0–100.0)
Monocytes Absolute: 0.5 10*3/uL (ref 0.1–1.0)
Monocytes Relative: 5 % (ref 3–12)
NEUTROS ABS: 9.1 10*3/uL — AB (ref 1.7–7.7)
NEUTROS PCT: 88 % — AB (ref 43–77)
Platelets: 155 10*3/uL (ref 150–400)
RBC: 5.05 MIL/uL (ref 4.22–5.81)
RDW: 14 % (ref 11.5–15.5)
WBC: 10.3 10*3/uL (ref 4.0–10.5)

## 2014-11-21 LAB — LIPASE, BLOOD: Lipase: 24 U/L (ref 11–59)

## 2014-11-21 LAB — PROTIME-INR
INR: 0.96 (ref 0.00–1.49)
Prothrombin Time: 12.9 seconds (ref 11.6–15.2)

## 2014-11-21 LAB — I-STAT CG4 LACTIC ACID, ED
LACTIC ACID, VENOUS: 1.48 mmol/L (ref 0.5–2.0)
Lactic Acid, Venous: 2.55 mmol/L (ref 0.5–2.0)

## 2014-11-21 LAB — I-STAT TROPONIN, ED: TROPONIN I, POC: 0 ng/mL (ref 0.00–0.08)

## 2014-11-21 LAB — COMPREHENSIVE METABOLIC PANEL
ALT: 10 U/L (ref 0–53)
ANION GAP: 14 (ref 5–15)
AST: 20 U/L (ref 0–37)
Albumin: 3.7 g/dL (ref 3.5–5.2)
Alkaline Phosphatase: 72 U/L (ref 39–117)
BILIRUBIN TOTAL: 1.5 mg/dL — AB (ref 0.3–1.2)
BUN: 26 mg/dL — AB (ref 6–23)
CO2: 19 mmol/L (ref 19–32)
Calcium: 8.9 mg/dL (ref 8.4–10.5)
Chloride: 105 mmol/L (ref 96–112)
Creatinine, Ser: 1.75 mg/dL — ABNORMAL HIGH (ref 0.50–1.35)
GFR calc Af Amer: 42 mL/min — ABNORMAL LOW (ref >90)
GFR, EST NON AFRICAN AMERICAN: 36 mL/min — AB (ref >90)
GLUCOSE: 97 mg/dL (ref 70–99)
Potassium: 4.1 mmol/L (ref 3.5–5.1)
SODIUM: 138 mmol/L (ref 135–145)
TOTAL PROTEIN: 6.1 g/dL (ref 6.0–8.3)

## 2014-11-21 MED ORDER — SODIUM CHLORIDE 0.9 % IV BOLUS (SEPSIS)
1000.0000 mL | Freq: Once | INTRAVENOUS | Status: AC
  Administered 2014-11-21: 1000 mL via INTRAVENOUS

## 2014-11-21 NOTE — ED Notes (Signed)
Per EMS[facility] Pt was reported to be altered and hypotensive at 5pm. Pt was alert to verbal. Pt has hx of dementia.

## 2014-11-21 NOTE — Discharge Instructions (Signed)

## 2014-11-21 NOTE — ED Notes (Signed)
Pt moved to room closer to the nursing stating due to high fall risk, safety sitter at bedside. Pt restless and pulling at monitor cords and IV.

## 2014-11-21 NOTE — ED Notes (Signed)
[facility] called to transport patient to nursing facility

## 2014-11-21 NOTE — ED Notes (Signed)
Pt resting at this time. Sitter at bedside 

## 2014-11-21 NOTE — ED Notes (Signed)
Upon entering patient room to started second fluid bolus, pt found standing at end of bed, large amount of urine noted in his brief and some on the floor, pt assisted back into bed and cleaned up, pt also pulled his IV out, dressing applied, MD notified and stated we should be able to discharge patient without new IV, not to start new IV at this time.

## 2014-11-21 NOTE — ED Notes (Signed)
[facility]

## 2014-11-21 NOTE — ED Provider Notes (Signed)
CSN: [id number]     Arrival date & time 11/21/14  1817 History   First MD Initiated Contact with Patient 11/21/14 1824     Chief Complaint  Patient presents with  . Altered Mental Status  . Hypotension     (Consider location/radiation/quality/duration/timing/severity/associated sxs/prior Treatment) HPI Comments: 79 yo M hx of HTN, CVA, Dementia, Urinary retention, presents with CC of altered mental status, hypotension.  Pt is resident of [facility], with report of increased altered mental status today.  EMS called out, and found pt to be bradycardic (50's), grey appearing, and hypotensive to 70's systolic.  Pt was given bolus in route.  On arrival EMS state not more information from SNF available.  Pt unable to participate in ROS 2/2 altered mental status.    Patient is a 79 y.o. male presenting with altered mental status. The history is provided by the EMS personnel. The history is limited by the condition of the patient. No language interpreter was used.  Altered Mental Status Presenting symptoms: disorientation   Severity:  Moderate Most recent episode:  Today Episode history:  Single Duration:  1 day Timing:  Constant Progression:  Worsening Chronicity:  New Context: dementia     Past Medical History  Diagnosis Date  . Hypertension   . Stroke   . Dementia   . Urinary retention 09/22/2014   Past Surgical History  Procedure Laterality Date  . Inguinal hernia repair N/A 06/20/2014    Procedure: LAPAROSCOPIC BILATERAL INGUINAL HERNIA REPAIR, LEFT FEMORAL AND INCARCERATED SUPRA UMBILICAL HERNIA REPAIR WITH MESH;  Surgeon: Karie Soda, MD;  Location: [facility];  Service: General;  Laterality: N/A;  . Insertion of mesh N/A 06/20/2014    Procedure: INSERTION OF MESH;  Surgeon: Karie Soda, MD;  Location: [facility];  Service: General;  Laterality: N/A;   No family history on file. History  Substance Use Topics  . Smoking status: Never Smoker   . Smokeless tobacco: Never Used   . Alcohol Use: No    Review of Systems  Unable to perform ROS: Mental status change      Allergies  Review of patient's allergies indicates no known allergies.  Home Medications   Prior to Admission medications   Medication Sig Start Date End Date Taking? Authorizing Provider  acetaminophen (TYLENOL) 500 MG tablet Take 500 mg by mouth every 4 (four) hours as needed for mild pain or moderate pain (pain).     Historical Provider, MD  alum & mag hydroxide-simeth (MAALOX/MYLANTA) 200-200-20 MG/5ML suspension Take 30 mLs by mouth 4 (four) times daily as needed for indigestion or heartburn.     Historical Provider, MD  aspirin 81 MG chewable tablet Chew 81 mg by mouth daily.    Historical Provider, MD  cholecalciferol (VITAMIN D) 1000 UNITS tablet Take 1,000 Units by mouth daily.    Historical Provider, MD  divalproex (DEPAKOTE SPRINKLE) 125 MG capsule Take 125 mg by mouth 2 (two) times daily.    Historical Provider, MD  donepezil (ARICEPT) 10 MG tablet Take 10 mg by mouth at bedtime.    Historical Provider, MD  guaifenesin (ROBITUSSIN) 100 MG/5ML syrup Take 200 mg by mouth 3 (three) times daily as needed for cough.    Historical Provider, MD  loperamide (IMODIUM) 2 MG capsule Take by mouth as needed for diarrhea or loose stools.    Historical Provider, MD  magnesium hydroxide (MILK OF MAGNESIA) 400 MG/5ML suspension Take 30 mLs by mouth daily as needed for mild constipation.  Historical Provider, MD  mirtazapine (REMERON) 30 MG tablet Take 30 mg by mouth at bedtime.    Historical Provider, MD  neomycin-bacitracin-polymyxin (NEOSPORIN) [phone] ointment Apply 1 application topically daily as needed (for skin tears,abrasions,minor irritation).     Historical Provider, MD  tamsulosin (FLOMAX) 0.4 MG CAPS capsule Take 1 capsule (0.4 mg total) by mouth daily. Patient taking differently: Take 0.8 mg by mouth daily.  09/27/14   Brittainy Sherlynn Carbon, PA-C  traMADol (ULTRAM) 50 MG tablet Take  100 mg by mouth every 6 (six) hours as needed for moderate pain or severe pain (pain). 06/20/14   Karie Soda, MD   BP 118/87 mmHg  Pulse 63  Temp(Src) 96.8 F (36 C) (Temporal)  Resp 20  SpO2 100% Physical Exam  HENT:  Head: Normocephalic and atraumatic.  Right Ear: External ear normal.  Left Ear: External ear normal.  Mouth/Throat: Oropharynx is clear and moist.  Eyes: Conjunctivae and EOM are normal. Pupils are equal, round, and reactive to light.  Neck: Normal range of motion. Neck supple.  Cardiovascular: Normal rate, regular rhythm, normal heart sounds and intact distal pulses.   Pulmonary/Chest: Effort normal and breath sounds normal. No respiratory distress. He has no wheezes. He has no rales. He exhibits no tenderness.  Abdominal: Soft. Bowel sounds are normal. He exhibits no distension and no mass. There is tenderness. There is no rebound and no guarding.  Soft, diffusely tender, no rebound, no guarding.   Musculoskeletal: Normal range of motion.  Neurological:  Awake, opens eyes on command, oriented to self only, moving all extremities without gross focal deficit.  Pt cannot participate with full ROS 2/2 mental status.   Skin: Skin is warm and dry.  Nursing note and vitals reviewed.   ED Course  Procedures (including critical care time) Labs Review Labs Reviewed  CBC WITH DIFFERENTIAL/PLATELET - Abnormal; Notable for the following:    Neutrophils Relative % 88 (*)    Lymphocytes Relative 7 (*)    Neutro Abs 9.1 (*)    All other components within normal limits  COMPREHENSIVE METABOLIC PANEL - Abnormal; Notable for the following:    BUN 26 (*)    Creatinine, Ser 1.75 (*)    Total Bilirubin 1.5 (*)    GFR calc non Af Amer 36 (*)    GFR calc Af Amer 42 (*)    All other components within normal limits  I-STAT CG4 LACTIC ACID, ED - Abnormal; Notable for the following:    Lactic Acid, Venous 2.55 (*)    All other components within normal limits  GRAM STAIN   PROTIME-INR  LIPASE, BLOOD  URINALYSIS, ROUTINE W REFLEX MICROSCOPIC  I-STAT TROPOININ, ED  I-STAT CG4 LACTIC ACID, ED  I-STAT CG4 LACTIC ACID, ED    Imaging Review Ct Abdomen Pelvis Wo Contrast  11/21/2014   CLINICAL DATA:  Lower abdominal pain and urinary retention. Episode of hypotension today.  EXAM: CT ABDOMEN AND PELVIS WITHOUT CONTRAST  TECHNIQUE: Multidetector CT imaging of the abdomen and pelvis was performed following the standard protocol without IV contrast.  COMPARISON:  CT abdomen and pelvis 09/08/2014.  FINDINGS: The lung bases are clear. Heart size is normal. Calcific coronary artery disease is noted. No pleural or pericardial effusion.  Scattered low attenuating lesions are again seen liver, unchanged. The gallbladder, adrenal glands and spleen appear normal. Low attenuating lesion in the lower pole of the left kidney is unchanged. A few punctate nonobstructing bilateral renal stones are noted. There is no  hydronephrosis on the right or left and no ureteral stone is seen. Bladder diverticulum on the right is unchanged. The prostate gland is mildly enlarged.  Mildly expanded left inferior rectus is unchanged in appearance and could be due to hematoma. The stomach, small and large bowel and appendix appear normal. There is no lymphadenopathy or fluid. No lytic or sclerotic bony lesion is identified.  IMPRESSION: No acute finding abdomen or pelvis.  No change in mild expansion of the left inferior rectus which could be due to asymmetric atrophy of the right rectus or possibly hematoma. Hematoma is thought less likely given stability.  Small punctate nonobstructing bilateral renal stones.  Atherosclerosis.  Mild prostatomegaly.   Electronically Signed   By: Drusilla Kannerhomas  Dalessio M.D.   On: 11/21/2014 20:55   Ct Head Wo Contrast  11/21/2014   CLINICAL DATA:  Confusion, altered mental status, hypotension  EXAM: CT HEAD WITHOUT CONTRAST  TECHNIQUE: Contiguous axial images were obtained from the  base of the skull through the vertex without intravenous contrast.  COMPARISON:  09/02/2013  FINDINGS: No skull fracture is noted. Paranasal sinuses and mastoid air cells are unremarkable. No intracranial hemorrhage, mass effect or midline shift. Stable cerebral atrophy. Stable old right frontal infarct. No acute cortical infarction. No mass lesion is noted on this unenhanced scan. Stable periventricular chronic white matter disease.  IMPRESSION: No acute intracranial abnormality. Stable cerebral atrophy and chronic white matter disease. Stable old infarct in right frontal lobe. No definite acute cortical infarction.   Electronically Signed   By: Natasha MeadLiviu  Pop M.D.   On: 11/21/2014 20:48     EKG Interpretation None      MDM   Final diagnoses:  Transient alteration of awareness  Hypotension, unspecified hypotension type  Dehydration   79 yo M hx of HTN, CVA, Dementia, Urinary retention, presents with CC of altered mental status, hypotension.  Physical exam as above.  Pt afebrile, HR 63, RR 20, BP 118/87, satting 100%.  Pt reportedly demented at baseline, oriented to self here, but not to place, or time.  Pt moving all extremities without gross deficit.  Labs demonstrate mild Lactic acid elevation 2.55.  BUN/Cr slightly elevated from baseline (1.30) to 26/1.75.  No leukocytosis.  CT head NAICA, with stable chronic atrophy, chronic white matter disease, and old infarct R frontal lobe.  CT abdomen/pelvis wo contrast demonstrates no acute findings in the abdomen or pelvis.    Pt received total of 2.5 L by EMS, and here in ED.  On reeval pt more alert, speaking spontaneously, standing up at times, but still obviously demented.  Likely dehydration given elevated Cr, hypotension, and lethargy which improved with fluids.  Pt afebrile, no complaints of urinary complaints, and dehydration more likely, unlikely UTI.    Pt okay for d/c home at this time.  Will send home with discharge instructions for facility,  return precautions.  No new medications required at this time.    Jon GillsWebb, Mayre Bury  I've discussed this patient with my attending Dr. Jeraldine LootsLockwood.   Jon GillsZach Deyna Carbon, MD 11/22/14 [id number]  Gerhard Munchobert Lockwood, MD 11/23/14 [phone]

## 2014-11-21 NOTE — ED Notes (Signed)
Attempted in and out cath for urine sample, unable to obtain urine, second RN tried with no urine return. Earlier bladder scan showed 30ml in bladder around 1900. MD aware.

## 2014-11-22 ENCOUNTER — Emergency Department (HOSPITAL_COMMUNITY)
Admission: EM | Admit: 2014-11-22 | Discharge: 2014-11-23 | Disposition: A | Discharge status: Home / Self Care | Payer: Medicare Other | Attending: Emergency Medicine | Admitting: Emergency Medicine

## 2014-11-22 ENCOUNTER — Encounter (HOSPITAL_COMMUNITY): Payer: Self-pay | Admitting: Emergency Medicine

## 2014-11-22 ENCOUNTER — Emergency Department (HOSPITAL_COMMUNITY): Payer: Medicare Other

## 2014-11-22 DIAGNOSIS — Z8673 Personal history of transient ischemic attack (TIA), and cerebral infarction without residual deficits: Secondary | ICD-10-CM | POA: Diagnosis not present

## 2014-11-22 DIAGNOSIS — Y998 Other external cause status: Secondary | ICD-10-CM | POA: Diagnosis not present

## 2014-11-22 DIAGNOSIS — W01198A Fall on same level from slipping, tripping and stumbling with subsequent striking against other object, initial encounter: Secondary | ICD-10-CM | POA: Diagnosis not present

## 2014-11-22 DIAGNOSIS — Y92128 Other place in nursing home as the place of occurrence of the external cause: Secondary | ICD-10-CM | POA: Diagnosis not present

## 2014-11-22 DIAGNOSIS — S0101XA Laceration without foreign body of scalp, initial encounter: Secondary | ICD-10-CM | POA: Diagnosis not present

## 2014-11-22 DIAGNOSIS — S0191XA Laceration without foreign body of unspecified part of head, initial encounter: Secondary | ICD-10-CM

## 2014-11-22 DIAGNOSIS — Y9389 Activity, other specified: Secondary | ICD-10-CM | POA: Diagnosis not present

## 2014-11-22 DIAGNOSIS — Z79899 Other long term (current) drug therapy: Secondary | ICD-10-CM | POA: Diagnosis not present

## 2014-11-22 DIAGNOSIS — Z7982 Long term (current) use of aspirin: Secondary | ICD-10-CM | POA: Insufficient documentation

## 2014-11-22 DIAGNOSIS — S3991XA Unspecified injury of abdomen, initial encounter: Secondary | ICD-10-CM | POA: Insufficient documentation

## 2014-11-22 DIAGNOSIS — S0990XA Unspecified injury of head, initial encounter: Secondary | ICD-10-CM | POA: Diagnosis present

## 2014-11-22 DIAGNOSIS — R001 Bradycardia, unspecified: Secondary | ICD-10-CM | POA: Insufficient documentation

## 2014-11-22 DIAGNOSIS — F0390 Unspecified dementia without behavioral disturbance: Secondary | ICD-10-CM | POA: Diagnosis not present

## 2014-11-22 DIAGNOSIS — W19XXXA Unspecified fall, initial encounter: Secondary | ICD-10-CM

## 2014-11-22 DIAGNOSIS — Z23 Encounter for immunization: Secondary | ICD-10-CM | POA: Diagnosis not present

## 2014-11-22 DIAGNOSIS — I10 Essential (primary) hypertension: Secondary | ICD-10-CM | POA: Insufficient documentation

## 2014-11-22 LAB — BASIC METABOLIC PANEL
Anion gap: 9 (ref 5–15)
BUN: 23 mg/dL (ref 6–23)
CO2: 22 mmol/L (ref 19–32)
Calcium: 8.7 mg/dL (ref 8.4–10.5)
Chloride: 107 mmol/L (ref 96–112)
Creatinine, Ser: 1.06 mg/dL (ref 0.50–1.35)
GFR calc Af Amer: 77 mL/min — ABNORMAL LOW (ref >90)
GFR calc non Af Amer: 66 mL/min — ABNORMAL LOW (ref >90)
GLUCOSE: 100 mg/dL — AB (ref 70–99)
POTASSIUM: 3.3 mmol/L — AB (ref 3.5–5.1)
Sodium: 138 mmol/L (ref 135–145)

## 2014-11-22 LAB — CBC
HCT: 40.1 % (ref 39.0–52.0)
HEMOGLOBIN: 13.3 g/dL (ref 13.0–17.0)
MCH: 29.4 pg (ref 26.0–34.0)
MCHC: 33.2 g/dL (ref 30.0–36.0)
MCV: 88.7 fL (ref 78.0–100.0)
Platelets: 184 10*3/uL (ref 150–400)
RBC: 4.52 MIL/uL (ref 4.22–5.81)
RDW: 13.8 % (ref 11.5–15.5)
WBC: 7.4 10*3/uL (ref 4.0–10.5)

## 2014-11-22 MED ORDER — TETANUS-DIPHTH-ACELL PERTUSSIS 5-2.5-18.5 LF-MCG/0.5 IM SUSP
0.5000 mL | Freq: Once | INTRAMUSCULAR | Status: AC
  Administered 2014-11-22: 0.5 mL via INTRAMUSCULAR
  Filled 2014-11-22: qty 0.5

## 2014-11-22 NOTE — ED Notes (Signed)
[facility] with c/o head injury after his fall tonight.  Pt reported that that he had a mechanical fall tonight--- he was trying to open his dresser and tripped and fell, hit head into the corner of a night stand, sustaining skin tear to scalp on back of head.  Pt presents to ED alert and oriented, able to answer questions appropriately. Pt's bleeding to scalp injury well-controlled---- bleeding stopped on arrival to ED.   Pt not on blood thinners.

## 2014-11-22 NOTE — ED Notes (Signed)
[facility] at bedside to transport patient 

## 2014-11-22 NOTE — ED Notes (Signed)
Bed: WA08 Expected date: 11/22/14 Expected time: 9:10 PM Means of arrival: Ambulance Comments: 79 yo M  Fall

## 2014-11-22 NOTE — ED Provider Notes (Signed)
Patient presented to the ER with unwitnessed fall. Patient suffered laceration to the back of the head. Patient is acutely confused, at his normal baseline.  Face to face Exam: HEENT - PERRLA Lungs - CTAB Heart - RRR, no M/R/G Abd - S/NT/ND Neuro - alert, disoriented  Plan: CT head and cervical spine negative for acute injury. Basic labs unremarkable. Local wound care. Return to skilled nursing facility.   Gilda Creasehristopher J. Keeyon Privitera, MD 11/22/14 [phone]

## 2014-11-22 NOTE — ED Provider Notes (Signed)
CSN: [id number]     Arrival date & time 11/22/14  2128 History   First MD Initiated Contact with Patient 11/22/14 2136     Chief Complaint  Patient presents with  . Fall  . Head Injury     (Consider location/radiation/quality/duration/timing/severity/associated sxs/prior Treatment) HPI Comments: The patient is a 79 yo M hx of HTN, CVA, Dementia, Urinary retention, presents with unwitnessed fall today. Per EMS the [facility] and has a laceration to his head.  Patient is unable to answer why he is in the ED at this time. And does not have any complaints. Little 5 caveat due to history of dementia  Patient is a 79 y.o. male presenting with fall and head injury. The history is provided by the patient and the EMS personnel. No language interpreter was used.  Fall  Head Injury   Past Medical History  Diagnosis Date  . Hypertension   . Stroke   . Dementia   . Urinary retention 09/22/2014   Past Surgical History  Procedure Laterality Date  . Inguinal hernia repair N/A 06/20/2014    Procedure: LAPAROSCOPIC BILATERAL INGUINAL HERNIA REPAIR, LEFT FEMORAL AND INCARCERATED SUPRA UMBILICAL HERNIA REPAIR WITH MESH;  Surgeon: Karie SodaSteven Gross, MD;  Location: [facility];  Service: General;  Laterality: N/A;  . Insertion of mesh N/A 06/20/2014    Procedure: INSERTION OF MESH;  Surgeon: Karie SodaSteven Gross, MD;  Location: [facility];  Service: General;  Laterality: N/A;   History reviewed. No pertinent family history. History  Substance Use Topics  . Smoking status: Never Smoker   . Smokeless tobacco: Never Used  . Alcohol Use: No    Review of Systems  Unable to perform ROS: Dementia      Allergies  Review of patient's allergies indicates no known allergies.  Home Medications   Prior to Admission medications   Medication Sig Start Date End Date Taking? Authorizing Provider  acetaminophen (TYLENOL) 500 MG tablet Take 500 mg by mouth every 4 (four) hours as  needed for mild pain or moderate pain (pain).     Historical Provider, MD  alum & mag hydroxide-simeth (MAALOX/MYLANTA) 200-200-20 MG/5ML suspension Take 30 mLs by mouth 4 (four) times daily as needed for indigestion or heartburn.     Historical Provider, MD  aspirin 81 MG chewable tablet Chew 81 mg by mouth daily.    Historical Provider, MD  cholecalciferol (VITAMIN D) 1000 UNITS tablet Take 1,000 Units by mouth daily.    Historical Provider, MD  divalproex (DEPAKOTE SPRINKLE) 125 MG capsule Take 125 mg by mouth 2 (two) times daily.    Historical Provider, MD  donepezil (ARICEPT) 10 MG tablet Take 10 mg by mouth at bedtime.    Historical Provider, MD  guaifenesin (ROBITUSSIN) 100 MG/5ML syrup Take 200 mg by mouth 3 (three) times daily as needed for cough.    Historical Provider, MD  loperamide (IMODIUM) 2 MG capsule Take by mouth as needed for diarrhea or loose stools.    Historical Provider, MD  magnesium hydroxide (MILK OF MAGNESIA) 400 MG/5ML suspension Take 30 mLs by mouth daily as needed for mild constipation.     Historical Provider, MD  mirtazapine (REMERON) 30 MG tablet Take 30 mg by mouth at bedtime.    Historical Provider, MD  neomycin-bacitracin-polymyxin (NEOSPORIN) [phone] ointment Apply 1 application topically daily as needed (for skin tears,abrasions,minor irritation).     Historical Provider, MD  tamsulosin (FLOMAX) 0.4 MG CAPS capsule Take 1  capsule (0.4 mg total) by mouth daily. Patient taking differently: Take 0.8 mg by mouth daily.  09/27/14   Brittainy Sherlynn Carbon, PA-C  traMADol (ULTRAM) 50 MG tablet Take 100 mg by mouth every 6 (six) hours as needed for moderate pain or severe pain (pain). 06/20/14   Karie Soda, MD   BP 153/72 mmHg  Pulse 55  Temp(Src) 98.1 F (36.7 C) (Oral)  Resp 18  SpO2 100% Physical Exam  Constitutional: He appears well-developed and well-nourished.  HENT:  Head: Normocephalic.  Too small lacerations, less than 1 cm each, to occiput of head.  Bleeding controlled.  Eyes: EOM are normal. Pupils are equal, round, and reactive to light.  Constricted pupils, reactive bilaterally   Neck: Neck supple.  Cardiovascular: Normal rate and regular rhythm.   Pulmonary/Chest: Effort normal. He has no wheezes. He has no rales.  Abdominal: Soft. He exhibits no distension. There is tenderness in the suprapubic area. There is no rebound and no guarding.  Mild lower abdominal tenderness.  Musculoskeletal: Normal range of motion.  Neurological: He is alert. He is disoriented. GCS eye subscore is 4. GCS verbal subscore is 5. GCS motor subscore is 6.  Disoriented to date and place. Patient answering questions inappropriately, giddy, laughs inappropriately.   Skin: Skin is warm and dry. He is not diaphoretic.  Psychiatric: His behavior is normal.  Nursing note and vitals reviewed.   ED Course  Procedures (including critical care time) Labs Review Labs Reviewed  BASIC METABOLIC PANEL - Abnormal; Notable for the following:    Potassium 3.3 (*)    Glucose, Bld 100 (*)    GFR calc non Af Amer 66 (*)    GFR calc Af Amer 77 (*)    All other components within normal limits  URINALYSIS, ROUTINE W REFLEX MICROSCOPIC - Abnormal; Notable for the following:    APPearance TURBID (*)    Hgb urine dipstick TRACE (*)    Leukocytes, UA MODERATE (*)    All other components within normal limits  URINE CULTURE  CBC  URINE MICROSCOPIC-ADD ON    Imaging Review Ct Abdomen Pelvis Wo Contrast  11/21/2014   CLINICAL DATA:  Lower abdominal pain and urinary retention. Episode of hypotension today.  EXAM: CT ABDOMEN AND PELVIS WITHOUT CONTRAST  TECHNIQUE: Multidetector CT imaging of the abdomen and pelvis was performed following the standard protocol without IV contrast.  COMPARISON:  CT abdomen and pelvis 09/08/2014.  FINDINGS: The lung bases are clear. Heart size is normal. Calcific coronary artery disease is noted. No pleural or pericardial effusion.  Scattered low  attenuating lesions are again seen liver, unchanged. The gallbladder, adrenal glands and spleen appear normal. Low attenuating lesion in the lower pole of the left kidney is unchanged. A few punctate nonobstructing bilateral renal stones are noted. There is no hydronephrosis on the right or left and no ureteral stone is seen. Bladder diverticulum on the right is unchanged. The prostate gland is mildly enlarged.  Mildly expanded left inferior rectus is unchanged in appearance and could be due to hematoma. The stomach, small and large bowel and appendix appear normal. There is no lymphadenopathy or fluid. No lytic or sclerotic bony lesion is identified.  IMPRESSION: No acute finding abdomen or pelvis.  No change in mild expansion of the left inferior rectus which could be due to asymmetric atrophy of the right rectus or possibly hematoma. Hematoma is thought less likely given stability.  Small punctate nonobstructing bilateral renal stones.  Atherosclerosis.  Mild  prostatomegaly.   Electronically Signed   By: Drusilla Kanner M.D.   On: 11/21/2014 20:55   Ct Head Wo Contrast  11/22/2014   CLINICAL DATA:  Status post fall this evening with a head injury. Laceration.  EXAM: CT HEAD WITHOUT CONTRAST  CT CERVICAL SPINE WITHOUT CONTRAST  TECHNIQUE: Multidetector CT imaging of the head and cervical spine was performed following the standard protocol without intravenous contrast. Multiplanar CT image reconstructions of the cervical spine were also generated.  COMPARISON:  Head CT scan 11/21/2014.  FINDINGS: CT HEAD FINDINGS  There is cortical atrophy and chronic microvascular ischemic change. Remote right frontal infarct is again seen. No evidence of acute abnormality including hemorrhage, infarct, mass lesion, mass effect, midline shift or abnormal extra-axial fluid collection is identified. No hydrocephalus or pneumocephalus. The calvarium is intact. Imaged paranasal sinuses and mastoid air cells are clear.  CT CERVICAL  SPINE FINDINGS  No fracture or malalignment of the cervical spine is identified. Multilevel cervical spondylosis is noted. Lung apices are clear.  IMPRESSION: No acute finding head or cervical spine.  Atrophy, chronic microvascular ischemic change and remote right frontal infarct.  Cervical spondylosis.   Electronically Signed   By: Drusilla Kanner M.D.   On: 11/22/2014 22:37   Ct Head Wo Contrast  11/21/2014   CLINICAL DATA:  Confusion, altered mental status, hypotension  EXAM: CT HEAD WITHOUT CONTRAST  TECHNIQUE: Contiguous axial images were obtained from the base of the skull through the vertex without intravenous contrast.  COMPARISON:  09/02/2013  FINDINGS: No skull fracture is noted. Paranasal sinuses and mastoid air cells are unremarkable. No intracranial hemorrhage, mass effect or midline shift. Stable cerebral atrophy. Stable old right frontal infarct. No acute cortical infarction. No mass lesion is noted on this unenhanced scan. Stable periventricular chronic white matter disease.  IMPRESSION: No acute intracranial abnormality. Stable cerebral atrophy and chronic white matter disease. Stable old infarct in right frontal lobe. No definite acute cortical infarction.   Electronically Signed   By: Natasha Mead M.D.   On: 11/21/2014 20:48   Ct Cervical Spine Wo Contrast  11/22/2014   CLINICAL DATA:  Status post fall this evening with a head injury. Laceration.  EXAM: CT HEAD WITHOUT CONTRAST  CT CERVICAL SPINE WITHOUT CONTRAST  TECHNIQUE: Multidetector CT imaging of the head and cervical spine was performed following the standard protocol without intravenous contrast. Multiplanar CT image reconstructions of the cervical spine were also generated.  COMPARISON:  Head CT scan 11/21/2014.  FINDINGS: CT HEAD FINDINGS  There is cortical atrophy and chronic microvascular ischemic change. Remote right frontal infarct is again seen. No evidence of acute abnormality including hemorrhage, infarct, mass lesion, mass  effect, midline shift or abnormal extra-axial fluid collection is identified. No hydrocephalus or pneumocephalus. The calvarium is intact. Imaged paranasal sinuses and mastoid air cells are clear.  CT CERVICAL SPINE FINDINGS  No fracture or malalignment of the cervical spine is identified. Multilevel cervical spondylosis is noted. Lung apices are clear.  IMPRESSION: No acute finding head or cervical spine.  Atrophy, chronic microvascular ischemic change and remote right frontal infarct.  Cervical spondylosis.   Electronically Signed   By: Drusilla Kanner M.D.   On: 11/22/2014 22:37     EKG Interpretation   Date/Time:  Tuesday November 22 2014 21:47:47 EST Ventricular Rate:  59 PR Interval:  169 QRS Duration: 74 QT Interval:  424 QTC Calculation: 420 R Axis:   59 Text Interpretation:  Sinus rhythm Anteroseptal infarct, age  indeterminate  Baseline wander in lead(s) V2 No significant change since last tracing  Confirmed by [facility]  MD, CHRISTOPHER [phone]) on 11/22/2014 11:41:21 PM      MDM   Final diagnoses:  Fall  Laceration of head, initial encounter  Bradycardia   Patient with fall, patient is not answering questions appropriately. He was seen in the emergency department yesterday for altered mental status and hypotension. Patient was discharged back to facility. Today with a fall with 2 small lacerations to occiput, bleeding controlled prior to arrival. Plan to update tetanus, perform wound care, repeat labs. Russell concerning abnormalities, EKG without significant change, CT negative for acute findings.  UA shows a moderate leuks with rare bacteria, culture sent. Dr. Blinda Leatherwood also evaluated the patient during this encounter, pt will be discharged to facility.      Mellody Drown, PA-C 11/23/14 1639  Gilda Crease, MD 11/25/14 [phone]

## 2014-11-23 DIAGNOSIS — S0101XA Laceration without foreign body of scalp, initial encounter: Secondary | ICD-10-CM | POA: Diagnosis not present

## 2014-11-23 LAB — URINALYSIS, ROUTINE W REFLEX MICROSCOPIC
BILIRUBIN URINE: NEGATIVE
Glucose, UA: NEGATIVE mg/dL
Ketones, ur: NEGATIVE mg/dL
Nitrite: NEGATIVE
Protein, ur: NEGATIVE mg/dL
Specific Gravity, Urine: 1.005 (ref 1.005–1.030)
UROBILINOGEN UA: 1 mg/dL (ref 0.0–1.0)
pH: 7 (ref 5.0–8.0)

## 2014-11-23 LAB — URINE MICROSCOPIC-ADD ON

## 2014-11-23 NOTE — Discharge Instructions (Signed)
Call your cardiologist for further evaluation of your slow heart rate. Call for a follow up appointment with a Family or Primary Care Provider.  Return if Symptoms worsen.   Take medication as prescribed.

## 2014-11-23 NOTE — ED Notes (Signed)
Report called to [facility]Sandra [facility]

## 2014-11-25 LAB — URINE CULTURE

## 2014-11-26 NOTE — Progress Notes (Signed)
Post ED Visit - Positive Culture Follow-up  Culture report reviewed by antimicrobial stewardship pharmacist: []  Marlou SaWes Dulaney, Pharm.D., BCPS []  Celedonio MiyamotoJeremy Frens, Pharm.D., BCPS []  Georgina PillionElizabeth Martin, Pharm.D., BCPS []  WashtucnaMinh Pham, [address]Ph[address] BCPS, AAHIVP [x]  Estella HuskMichelle Freman Lapage, Pharm.D., BCPS, AAHIVP []  Elder CyphersLorie Poole, [address]Pharm[address]D., BCPS  Positive Urine culture   70K colonies of coagulase negative staph species, represents a contaminated specimen.  No treatment recommended.  Sallee Provencalurner, Jacob York 11/26/2014, 8:30 AM

## 2014-12-04 ENCOUNTER — Encounter (HOSPITAL_COMMUNITY): Payer: Self-pay

## 2014-12-04 ENCOUNTER — Emergency Department (HOSPITAL_COMMUNITY)
Admission: EM | Admit: 2014-12-04 | Discharge: 2014-12-05 | Disposition: A | Discharge status: Home / Self Care | Payer: Medicare Other | Attending: Emergency Medicine | Admitting: Emergency Medicine

## 2014-12-04 DIAGNOSIS — Y998 Other external cause status: Secondary | ICD-10-CM | POA: Insufficient documentation

## 2014-12-04 DIAGNOSIS — F0390 Unspecified dementia without behavioral disturbance: Secondary | ICD-10-CM | POA: Diagnosis not present

## 2014-12-04 DIAGNOSIS — Z7982 Long term (current) use of aspirin: Secondary | ICD-10-CM | POA: Diagnosis not present

## 2014-12-04 DIAGNOSIS — Y92128 Other place in nursing home as the place of occurrence of the external cause: Secondary | ICD-10-CM | POA: Insufficient documentation

## 2014-12-04 DIAGNOSIS — I10 Essential (primary) hypertension: Secondary | ICD-10-CM | POA: Diagnosis not present

## 2014-12-04 DIAGNOSIS — S0001XA Abrasion of scalp, initial encounter: Secondary | ICD-10-CM | POA: Diagnosis not present

## 2014-12-04 DIAGNOSIS — W01198A Fall on same level from slipping, tripping and stumbling with subsequent striking against other object, initial encounter: Secondary | ICD-10-CM | POA: Diagnosis not present

## 2014-12-04 DIAGNOSIS — Y9301 Activity, walking, marching and hiking: Secondary | ICD-10-CM | POA: Diagnosis not present

## 2014-12-04 DIAGNOSIS — Z79899 Other long term (current) drug therapy: Secondary | ICD-10-CM | POA: Diagnosis not present

## 2014-12-04 DIAGNOSIS — Z8673 Personal history of transient ischemic attack (TIA), and cerebral infarction without residual deficits: Secondary | ICD-10-CM | POA: Insufficient documentation

## 2014-12-04 DIAGNOSIS — S0990XA Unspecified injury of head, initial encounter: Secondary | ICD-10-CM | POA: Diagnosis present

## 2014-12-04 DIAGNOSIS — W19XXXA Unspecified fall, initial encounter: Secondary | ICD-10-CM

## 2014-12-04 NOTE — Discharge Instructions (Signed)
Fall Prevention and Home Safety °Falls cause injuries and can affect all age groups. It is possible to prevent falls.  °HOW TO PREVENT FALLS °· Wear shoes with rubber soles that do not have an opening for your toes. °· Keep the inside and outside of your house well lit. °· Use night lights throughout your home. °· Remove clutter from floors. °· Clean up floor spills. °· Remove throw rugs or fasten them to the floor with carpet tape. °· Do not place electrical cords across pathways. °· Put grab bars by your tub, shower, and toilet. Do not use towel bars as grab bars. °· Put handrails on both sides of the stairway. Fix loose handrails. °· Do not climb on stools or stepladders, if possible. °· Do not wax your floors. °· Repair uneven or unsafe sidewalks, walkways, or stairs. °· Keep items you use a lot within reach. °· Be aware of pets. °· Keep emergency numbers next to the telephone. °· Put smoke detectors in your home and near bedrooms. °Ask your doctor what other things you can do to prevent falls. °Document Released: 07/20/2009 Document Revised: 03/24/2012 Document Reviewed: 12/24/2011 °ExitCare® Patient Information ©2015 [employer]. This information is not intended to replace advice given to you by your health care provider. Make sure you discuss any questions you have with your health care provider. ° °

## 2014-12-04 NOTE — ED Provider Notes (Signed)
CSN: [id number]     Arrival date & time 12/04/14  1854 History   First MD Initiated Contact with Patient 12/04/14 1857     Chief Complaint  Patient presents with  . Fall  . Head Injury   HPI  Patient is a 79 year old male with past medical history of hypertension, stroke, dementia who presents to the emergency room via EMS from [facility] for evaluation of a fall. Patient had a witnessed fall and hit his head at [facility]. Patient had no loss of consciousness. Patient is at baseline per the nursing home. Patient currently denies any pain. Patient was recently seen on 11/22/2014 for another fall and had posterior occipital lacerations at that time. EMS reports that the patient has abrasions or lacerations to the posterior occiput again with no evidence of bleeding.  Past Medical History  Diagnosis Date  . Hypertension   . Stroke   . Dementia   . Urinary retention 09/22/2014   Past Surgical History  Procedure Laterality Date  . Inguinal hernia repair N/A 06/20/2014    Procedure: LAPAROSCOPIC BILATERAL INGUINAL HERNIA REPAIR, LEFT FEMORAL AND INCARCERATED SUPRA UMBILICAL HERNIA REPAIR WITH MESH;  Surgeon: Karie Soda, MD;  Location: [facility];  Service: General;  Laterality: N/A;  . Insertion of mesh N/A 06/20/2014    Procedure: INSERTION OF MESH;  Surgeon: Karie Soda, MD;  Location: [facility];  Service: General;  Laterality: N/A;   History reviewed. No pertinent family history. History  Substance Use Topics  . Smoking status: Never Smoker   . Smokeless tobacco: Never Used  . Alcohol Use: No    Review of Systems  Level V caveat secondary to dementia.  Allergies  Review of patient's allergies indicates no known allergies.  Home Medications   Prior to Admission medications   Medication Sig Start Date End Date Taking? Authorizing Provider  acetaminophen (TYLENOL) 500 MG tablet Take 500 mg by mouth every 4 (four) hours as needed for mild pain or moderate pain (pain).      Historical Provider, MD  alum & mag hydroxide-simeth (MAALOX/MYLANTA) 200-200-20 MG/5ML suspension Take 30 mLs by mouth every 6 (six) hours as needed for indigestion or heartburn.     Historical Provider, MD  aspirin 81 MG chewable tablet Chew 81 mg by mouth daily.    Historical Provider, MD  cholecalciferol (VITAMIN D) 1000 UNITS tablet Take 1,000 Units by mouth daily.    Historical Provider, MD  divalproex (DEPAKOTE SPRINKLE) 125 MG capsule Take 125 mg by mouth 2 (two) times daily.    Historical Provider, MD  donepezil (ARICEPT) 10 MG tablet Take 10 mg by mouth at bedtime.    Historical Provider, MD  guaifenesin (ROBITUSSIN) 100 MG/5ML syrup Take 200 mg by mouth 3 (three) times daily as needed for cough.    Historical Provider, MD  loperamide (IMODIUM) 2 MG capsule Take 2 mg by mouth as needed for diarrhea or loose stools.     Historical Provider, MD  magnesium hydroxide (MILK OF MAGNESIA) 400 MG/5ML suspension Take 30 mLs by mouth at bedtime as needed for mild constipation.     Historical Provider, MD  mirtazapine (REMERON) 30 MG tablet Take 30 mg by mouth at bedtime.    Historical Provider, MD  neomycin-bacitracin-polymyxin (NEOSPORIN) [phone] ointment Apply 1 application topically daily as needed (for skin tears,abrasions,minor irritation).     Historical Provider, MD  tamsulosin (FLOMAX) 0.4 MG CAPS capsule Take 1 capsule (0.4 mg total) by mouth daily. Patient taking differently: Take  0.8 mg by mouth daily.  09/27/14   Brittainy Sherlynn CarbonM Simmons, PA-C  traMADol (ULTRAM) 50 MG tablet Take 100 mg by mouth every 6 (six) hours as needed for moderate pain or severe pain (pain). 06/20/14   Karie SodaSteven Gross, MD   BP 151/65 mmHg  Pulse 73  Temp(Src) 97.9 F (36.6 C) (Oral)  Resp 18  SpO2 99% Physical Exam  Constitutional: He appears well-developed and well-nourished. No distress.  HENT:  Head: Normocephalic and atraumatic.  Mouth/Throat: Oropharynx is clear and moist. No oropharyngeal exudate.  Eyes:  Conjunctivae and EOM are normal. Pupils are equal, round, and reactive to light. No scleral icterus.  Neck: Normal range of motion. Neck supple. No JVD present. No spinous process tenderness and no muscular tenderness present. Normal range of motion present. No thyromegaly present.  Cardiovascular: Normal rate, regular rhythm and intact distal pulses.  Exam reveals no gallop and no friction rub.   No murmur heard. Pulmonary/Chest: Effort normal and breath sounds normal. No respiratory distress. He has no wheezes. He has no rales. He exhibits no tenderness.  Abdominal: Soft. Bowel sounds are normal. He exhibits no distension and no mass. There is no tenderness. There is no rebound and no guarding.  Musculoskeletal:  Patient moves all 4 extremities with full active and passive range of motion that appears to be pain-free. No palpable step-offs or pain on the bony cervical, thoracic, or lumbar spine.  Lymphadenopathy:    He has no cervical adenopathy.  Neurological: He is alert. He has normal strength. No cranial nerve deficit or sensory deficit. Coordination normal.  Patient is oriented to self.  Skin: Skin is warm and dry. He is not diaphoretic.  Psychiatric: He has a normal mood and affect. His behavior is normal. Judgment and thought content normal.  Nursing note and vitals reviewed.   ED Course  Procedures (including critical care time) Labs Review Labs Reviewed - No data to display  Imaging Review No results found.   EKG Interpretation None      MDM   Final diagnoses:  Fall, initial encounter  Abrasion of occiput, initial encounter   Patient is a 79 year old male who presents emergency room for evaluation of a fall at a nursing home. Follows witnessed. Patient does not lose consciousness. Patient is not on blood thinners. There are no focal neurological deficits on examination. There appears to be no musculoskeletal injuries as patient has full pain-free range of motion of all  4 extremities. Findings no palpable deformities or step-offs. I do not feel patient needs head CT at this time as he is at baseline. Patient seen by and discussed with Dr. Adriana Simasook who agrees the above workup and plan. Suggest wound care for posterior abrasion. No intervention is required at this time. Patient stable to be followed up at the nursing home. He is to return for changes in baseline behavior, lethargy, intractable nausea and vomiting, or any other concerning symptoms. Patient be transported back to his care facility.  Eben Burowourtney A Forcucci, PA-C 12/04/14 2041  Donnetta HutchingBrian Cook, MD 12/07/14 [phone]

## 2014-12-04 NOTE — ED Notes (Signed)
[facility]  Pt had witnessed fall at around 6:15pm.  Pt was walking in dining room.  Pt fell landing on side.  Pt has small abrasion to back of head.  Pt remained on floor until EMS arrival.  No LOC. Pt oriented to baseline.  HX dementia.  Denies pain.  Vitals:  bp Palp 136, hr 100, 18 resp 98% ra

## 2014-12-04 NOTE — ED Notes (Signed)
Bed: [facility] Expected date:  Expected time:  Means of arrival:  Comments: 

## 2015-01-18 NOTE — Progress Notes (Signed)
Cardiology Office Note   Date:  01/19/2015   ID:  Jacob York, DOB 17-Jul-1936, MRN [id number]  PCP:  Ron Parker, MD  Cardiologist:  Dr. Nicki Guadalajara   Chief Complaint  Patient presents with  . Bradycardia     History of Present Illness: Jacob York is a 79 y.o. male with a hx of dementia, HTN, CKD.  Admitted in 09/2014 with syncope. He resides at ALF.  In hospital Tele demonstrated ventricular bigeminy with bradycardia.  HR was initially in the 30s and improved into the 50s.  His beta-blocker was DC'd.  He continued to have increased ventricular ectopy.  OP event monitor was ordered.  He never got the event monitor placed.  He lives at [facility]. He has gone to the emergency room on 3 occasions since he was admitted to the hospital in December. He had an episode of altered consciousness on February 15. He was noted be bradycardic with heart rate in the 50s. IV fluids were given and he was thought to have been dehydrated. He fell and hit his head on 2 other occasions in February. There have been no other witnessed episodes of syncope. He is unable to give an accurate history due to dementia. There have been no reports of chest pain or significant dyspnea. There have been no reports of orthopnea or PND. He denies LE edema.   Studies/Reports Reviewed Today:  Echo 09/2014 - EF 60% to 65%. Wall motion was normal. Grade 1 diastolic dysfunction - Mitral valve: Mildly thickened leaflets . There was no significant regurgitation. - Left atrium: The atrium was normal in size.  Impressions:  LVEF 60-65%, normal wall thickness, normal wall motion, diastolic dysfunction, normal LV filling pressure, normal LA size.   Past Medical History  Diagnosis Date  . Hypertension   . Stroke   . Dementia   . Urinary retention 09/22/2014    Past Surgical History  Procedure Laterality Date  . Inguinal hernia repair N/A 06/20/2014    Procedure: LAPAROSCOPIC BILATERAL INGUINAL HERNIA  REPAIR, LEFT FEMORAL AND INCARCERATED SUPRA UMBILICAL HERNIA REPAIR WITH MESH;  Surgeon: Karie Soda, MD;  Location: [facility];  Service: General;  Laterality: N/A;  . Insertion of mesh N/A 06/20/2014    Procedure: INSERTION OF MESH;  Surgeon: Karie Soda, MD;  Location: [facility];  Service: General;  Laterality: N/A;     Current Outpatient Prescriptions  Medication Sig Dispense Refill  . acetaminophen (TYLENOL) 500 MG tablet Take 500 mg by mouth every 4 (four) hours as needed for mild pain or moderate pain (pain).     Marland Kitchen alum & mag hydroxide-simeth (MAALOX/MYLANTA) 200-200-20 MG/5ML suspension Take 30 mLs by mouth every 6 (six) hours as needed for indigestion or heartburn.     Marland Kitchen aspirin 81 MG chewable tablet Chew 81 mg by mouth daily.    . cholecalciferol (VITAMIN D) 1000 UNITS tablet Take 1,000 Units by mouth daily.    . divalproex (DEPAKOTE SPRINKLE) 125 MG capsule Take 125 mg by mouth 2 (two) times daily.    Marland Kitchen donepezil (ARICEPT) 10 MG tablet Take 10 mg by mouth at bedtime.    Marland Kitchen guaifenesin (ROBITUSSIN) 100 MG/5ML syrup Take 200 mg by mouth 3 (three) times daily as needed for cough.    . loperamide (IMODIUM) 2 MG capsule Take 2 mg by mouth as needed for diarrhea or loose stools.     . magnesium hydroxide (MILK OF MAGNESIA) 400 MG/5ML suspension Take 30 mLs by mouth at  bedtime as needed for mild constipation.     . mirtazapine (REMERON) 30 MG tablet Take 30 mg by mouth at bedtime.    Marland Kitchen. neomycin-bacitracin-polymyxin (NEOSPORIN) [phone] ointment Apply 1 application topically daily as needed (for skin tears,abrasions,minor irritation).     . tamsulosin (FLOMAX) 0.4 MG CAPS capsule Take 1 capsule (0.4 mg total) by mouth daily. (Patient taking differently: Take 0.8 mg by mouth daily. ) 30 capsule 5  . traMADol (ULTRAM) 50 MG tablet Take 100 mg by mouth every 6 (six) hours as needed for moderate pain or severe pain (pain).     No current facility-administered medications for this visit.     Allergies:   Review of patient's allergies indicates no known allergies.    Social History:  The patient  reports that he has never smoked. He has never used smokeless tobacco. He reports that he does not drink alcohol or use illicit drugs.   Family History:  The patient's family history includes Hypertension in his mother; Stroke in his mother.    ROS:   Please see the history of present illness.   Review of Systems  All other systems reviewed and are negative.     PHYSICAL EXAM: VS:  BP 130/80 mmHg  Pulse 93  Ht 5\' 8"  (1.727 m)  Wt 159 lb (72.122 kg)  BMI 24.18 kg/m2    Wt Readings from Last 3 Encounters:  01/19/15 159 lb (72.122 kg)  09/27/14 170 lb 3.1 oz (77.2 kg)  06/20/14 173 lb 8 oz (78.699 kg)     GEN: Well nourished, well developed, in no acute distress HEENT: normal Neck: no JVD, no masses Cardiac:  Normal S1/S2, RRR; no murmur ,  no rubs or gallops, no edema  Respiratory:  clear to auscultation bilaterally, no wheezing, rhonchi or rales. GI: soft, nontender, nondistended, + BS MS: no deformity or atrophy Skin: warm and dry  Neuro:  CNs II-XII intact, Strength and sensation are intact Psych: Normal affect   EKG:  EKG is ordered today.  It demonstrates:   NSR, HR 93, normal axis, first-degree AV block, PR 224   Recent Labs: 09/21/2014: Magnesium 1.9; TSH 1.780 11/21/2014: ALT 10 11/22/2014: BUN 23; Creatinine 1.06; Hemoglobin 13.3; Platelets 184; Potassium 3.3*; Sodium 138    Lipid Panel No results found for: CHOL, TRIG, HDL, CHOLHDL, VLDL, LDLCALC, LDLDIRECT    ASSESSMENT AND PLAN:  Bradycardia No evidence of bradycardia on ECG today. His dementia makes it difficult to get an accurate history. He does have some agitation at night. I'm not certain that he would be able to tolerate an event monitor for full 30 days. We will try to place an event monitor for 1 week.  Syncope, unspecified syncope type  No apparent recurrence. Echo in12/2015  demonstrated normal LVF.  Obtain event monitor as noted. He does not drive.  Essential hypertension  Controlled.   Current medicines are reviewed at length with the patient today.  The patient does not have concerns regarding medicines.  The following changes have been made:  no change  Labs/ tests ordered today include:  Orders Placed This Encounter  Procedures  . EKG 12-Lead  . Cardiac event monitor    Disposition:   FU with Dr. Nicki Guadalajarahomas Kelly 3 mos.   Signed, Brynda RimScott Myla Mauriello, PA-C, MHS 01/19/2015 9:05 AM    [facility] [address], [place], [place]  [address] Phone: [phone]; Fax: [phone]

## 2015-01-19 ENCOUNTER — Ambulatory Visit (INDEPENDENT_AMBULATORY_CARE_PROVIDER_SITE_OTHER): Payer: Medicare Other | Admitting: Physician Assistant

## 2015-01-19 ENCOUNTER — Encounter: Payer: Self-pay | Admitting: Physician Assistant

## 2015-01-19 VITALS — BP 130/80 | HR 93 | Ht 68.0 in | Wt 159.0 lb

## 2015-01-19 DIAGNOSIS — R55 Syncope and collapse: Secondary | ICD-10-CM

## 2015-01-19 DIAGNOSIS — R001 Bradycardia, unspecified: Secondary | ICD-10-CM

## 2015-01-19 DIAGNOSIS — I10 Essential (primary) hypertension: Secondary | ICD-10-CM

## 2015-01-19 NOTE — Patient Instructions (Addendum)
Medication Instructions:  Your physician recommends that you continue on your current medications as directed. Please refer to the Current Medication list given to you today.   Labwork: NONE AT THIS TIME  Testing/Procedures: Your physician has recommended that you wear an event monitor. (SEE NOTES IN ORDER)Event monitors are medical devices that record the heart's electrical activity. Doctors most often us these monitors to diagnose arrhythmias. Arrhythmias are problems with the speed or rhythm of the heartbeat. The monitor is a small, portable device. You can wear one while you do your normal daily activities. This is usually used to diagnose what is causing palpitations/syncope (passing out).    Follow-Up:  Your physician wants you to follow-up in:  IN 3 MONTHS WITH DR Tresa EndoKELLY  You will receive a reminder letter in the mail two months in advance. If you don't receive a letter, please call our office to schedule the follow-up appointment.     Any Other Special Instructions Will Be Listed Below (If Applicable).

## 2015-03-01 ENCOUNTER — Encounter (HOSPITAL_COMMUNITY): Payer: Self-pay

## 2015-03-01 ENCOUNTER — Emergency Department (HOSPITAL_COMMUNITY): Payer: Medicare Other

## 2015-03-01 ENCOUNTER — Inpatient Hospital Stay (HOSPITAL_COMMUNITY)
Admission: EM | Admit: 2015-03-01 | Discharge: 2015-03-04 | DRG: 690 | Disposition: A | Discharge status: Skilled Nursing Facility | Payer: Medicare Other | Attending: Internal Medicine | Admitting: Internal Medicine

## 2015-03-01 DIAGNOSIS — N183 Chronic kidney disease, stage 3 unspecified: Secondary | ICD-10-CM | POA: Diagnosis present

## 2015-03-01 DIAGNOSIS — I440 Atrioventricular block, first degree: Secondary | ICD-10-CM | POA: Diagnosis present

## 2015-03-01 DIAGNOSIS — R001 Bradycardia, unspecified: Secondary | ICD-10-CM | POA: Diagnosis present

## 2015-03-01 DIAGNOSIS — N390 Urinary tract infection, site not specified: Principal | ICD-10-CM | POA: Diagnosis present

## 2015-03-01 DIAGNOSIS — I129 Hypertensive chronic kidney disease with stage 1 through stage 4 chronic kidney disease, or unspecified chronic kidney disease: Secondary | ICD-10-CM | POA: Diagnosis present

## 2015-03-01 DIAGNOSIS — Z7982 Long term (current) use of aspirin: Secondary | ICD-10-CM | POA: Diagnosis not present

## 2015-03-01 DIAGNOSIS — W07XXXA Fall from chair, initial encounter: Secondary | ICD-10-CM | POA: Diagnosis present

## 2015-03-01 DIAGNOSIS — Z8673 Personal history of transient ischemic attack (TIA), and cerebral infarction without residual deficits: Secondary | ICD-10-CM | POA: Diagnosis not present

## 2015-03-01 DIAGNOSIS — N189 Chronic kidney disease, unspecified: Secondary | ICD-10-CM | POA: Diagnosis not present

## 2015-03-01 DIAGNOSIS — F03918 Unspecified dementia, unspecified severity, with other behavioral disturbance: Secondary | ICD-10-CM | POA: Diagnosis present

## 2015-03-01 DIAGNOSIS — R55 Syncope and collapse: Secondary | ICD-10-CM | POA: Diagnosis present

## 2015-03-01 DIAGNOSIS — Z87898 Personal history of other specified conditions: Secondary | ICD-10-CM | POA: Diagnosis present

## 2015-03-01 DIAGNOSIS — I10 Essential (primary) hypertension: Secondary | ICD-10-CM | POA: Diagnosis present

## 2015-03-01 DIAGNOSIS — I951 Orthostatic hypotension: Secondary | ICD-10-CM | POA: Diagnosis present

## 2015-03-01 DIAGNOSIS — F0391 Unspecified dementia with behavioral disturbance: Secondary | ICD-10-CM | POA: Diagnosis not present

## 2015-03-01 LAB — I-STAT TROPONIN, ED: Troponin i, poc: 0 ng/mL (ref 0.00–0.08)

## 2015-03-01 LAB — BASIC METABOLIC PANEL
Anion gap: 9 (ref 5–15)
BUN: 20 mg/dL (ref 6–20)
CALCIUM: 9.3 mg/dL (ref 8.9–10.3)
CO2: 25 mmol/L (ref 22–32)
Chloride: 106 mmol/L (ref 101–111)
Creatinine, Ser: 1.37 mg/dL — ABNORMAL HIGH (ref 0.61–1.24)
GFR calc non Af Amer: 49 mL/min — ABNORMAL LOW (ref >60)
GFR, EST AFRICAN AMERICAN: 56 mL/min — AB (ref >60)
Glucose, Bld: 104 mg/dL — ABNORMAL HIGH (ref 65–99)
POTASSIUM: 3.5 mmol/L (ref 3.5–5.1)
Sodium: 140 mmol/L (ref 135–145)

## 2015-03-01 LAB — CBC WITH DIFFERENTIAL/PLATELET
BASOS ABS: 0.1 10*3/uL (ref 0.0–0.1)
Basophils Relative: 1 % (ref 0–1)
EOS PCT: 3 % (ref 0–5)
Eosinophils Absolute: 0.2 10*3/uL (ref 0.0–0.7)
HCT: 41.1 % (ref 39.0–52.0)
HEMOGLOBIN: 14.1 g/dL (ref 13.0–17.0)
LYMPHS ABS: 1.6 10*3/uL (ref 0.7–4.0)
Lymphocytes Relative: 21 % (ref 12–46)
MCH: 30.1 pg (ref 26.0–34.0)
MCHC: 34.3 g/dL (ref 30.0–36.0)
MCV: 87.8 fL (ref 78.0–100.0)
Monocytes Absolute: 0.5 10*3/uL (ref 0.1–1.0)
Monocytes Relative: 7 % (ref 3–12)
NEUTROS PCT: 68 % (ref 43–77)
Neutro Abs: 5.3 10*3/uL (ref 1.7–7.7)
Platelets: 188 10*3/uL (ref 150–400)
RBC: 4.68 MIL/uL (ref 4.22–5.81)
RDW: 14 % (ref 11.5–15.5)
WBC: 7.7 10*3/uL (ref 4.0–10.5)

## 2015-03-01 LAB — TSH: TSH: 0.677 u[IU]/mL (ref 0.350–4.500)

## 2015-03-01 MED ORDER — DONEPEZIL HCL 10 MG PO TABS
10.0000 mg | ORAL_TABLET | Freq: Every day | ORAL | Status: DC
  Administered 2015-03-01: 10 mg via ORAL
  Filled 2015-03-01 (×2): qty 1

## 2015-03-01 MED ORDER — DIVALPROEX SODIUM 125 MG PO CSDR
125.0000 mg | DELAYED_RELEASE_CAPSULE | Freq: Two times a day (BID) | ORAL | Status: DC
  Filled 2015-03-01 (×2): qty 1

## 2015-03-01 MED ORDER — HALOPERIDOL LACTATE 5 MG/ML IJ SOLN
1.0000 mg | Freq: Four times a day (QID) | INTRAMUSCULAR | Status: DC | PRN
  Administered 2015-03-02 – 2015-03-04 (×3): 1 mg via INTRAVENOUS
  Filled 2015-03-01 (×5): qty 0.2

## 2015-03-01 MED ORDER — DIVALPROEX SODIUM 125 MG PO CSDR
125.0000 mg | DELAYED_RELEASE_CAPSULE | Freq: Two times a day (BID) | ORAL | Status: DC
  Administered 2015-03-01 – 2015-03-04 (×5): 125 mg via ORAL
  Filled 2015-03-01 (×8): qty 1

## 2015-03-01 MED ORDER — ENOXAPARIN SODIUM 40 MG/0.4ML ~~LOC~~ SOLN
40.0000 mg | SUBCUTANEOUS | Status: DC
  Administered 2015-03-01 – 2015-03-03 (×3): 40 mg via SUBCUTANEOUS
  Filled 2015-03-01 (×4): qty 0.4

## 2015-03-01 MED ORDER — TAMSULOSIN HCL 0.4 MG PO CAPS
0.4000 mg | ORAL_CAPSULE | Freq: Every day | ORAL | Status: DC
  Administered 2015-03-01 – 2015-03-03 (×3): 0.4 mg via ORAL
  Filled 2015-03-01 (×4): qty 1

## 2015-03-01 MED ORDER — MIRTAZAPINE 30 MG PO TABS
30.0000 mg | ORAL_TABLET | Freq: Every day | ORAL | Status: DC
  Administered 2015-03-01 – 2015-03-03 (×3): 30 mg via ORAL
  Filled 2015-03-01 (×5): qty 1

## 2015-03-01 MED ORDER — ASPIRIN 81 MG PO CHEW
81.0000 mg | CHEWABLE_TABLET | Freq: Every day | ORAL | Status: DC
  Administered 2015-03-03 – 2015-03-04 (×2): 81 mg via ORAL
  Filled 2015-03-01 (×3): qty 1

## 2015-03-01 MED ORDER — SODIUM CHLORIDE 0.9 % IJ SOLN
3.0000 mL | Freq: Two times a day (BID) | INTRAMUSCULAR | Status: DC
  Administered 2015-03-01 – 2015-03-03 (×4): 3 mL via INTRAVENOUS

## 2015-03-01 MED ORDER — SODIUM CHLORIDE 0.9 % IV SOLN
INTRAVENOUS | Status: DC
  Administered 2015-03-01 – 2015-03-04 (×3): via INTRAVENOUS

## 2015-03-01 NOTE — ED Notes (Signed)
Patient taken upstairs by Ronne Binningrimaine, EMT.

## 2015-03-01 NOTE — ED Notes (Signed)
Admitting MD at the bedside.  

## 2015-03-01 NOTE — ED Notes (Signed)
Attempted to have pt use urinal to obtain urine. PT stated understanding and willingness to use urinal. Pt suddenly threw urinal across room and began yelling. After calming down pt stated that he would like to use the urinal again and didn't want to be cathed. Pt given urinal back. Pt then covered himself with blankets and went to sleep. RN Shanda BumpsJessica and PA [facility] informed of events.

## 2015-03-01 NOTE — ED Notes (Signed)
Called Pharmacy to reschedule medications.

## 2015-03-01 NOTE — ED Notes (Signed)
Attempted Report x1.   

## 2015-03-01 NOTE — Progress Notes (Signed)
Pt HR bradycardic on monitor upon arrival to the unit, drops to the 30's with lowest at 35. Pt asymptomatic and sleeping comfortably in bed. Dr. Seth BakeElgergaway notified, no new orders received. Will closely monitor. Jacob York. Amo Keyarra Rendall RN.

## 2015-03-01 NOTE — ED Notes (Signed)
Patient taken to CT.

## 2015-03-01 NOTE — ED Notes (Signed)
Pt placed in gown and in bed. Pt monitored by pulse ox, bp cuff, and 12-lead. 

## 2015-03-01 NOTE — ED Notes (Signed)
Per EMS, Patient was sitting in the activity room and had a syncopal episode. Patient fell out of the chair to the floor. Patient was responsive to facility staff before EMS arrived. Patient was at baseline before EMS arrived. Patient denies any pain or complaints. Patient was sinus bradycardia with first degree block on EMS. Vitals per EMS: 100 palpable Systolic, 45-50 HR, 16 RR, 94% on RA. Patient is alert and oriented x4.

## 2015-03-01 NOTE — Progress Notes (Signed)
Report received from the ED RN at 1740 and pt arrived to the unit via stretcher at 1810. Pt alert and verbally responsive; VSS; telemetry applied and confirmed with CCMD; pt noted to be incontinent upon arrival to unit; condom cath applied; skin dry and intact with no pressure ulcer noted. Pt oriented to the unit and room; call light within reach; bed alarm activated; MRSA pcr sent; will closely monitor pt. Arabella MerlesP. Amo Leatha Rohner RN.

## 2015-03-01 NOTE — ED Notes (Signed)
Spoke with Internal Medicine. Stated not to worry about IN and Out for now. "Whatever happens, happens." Patient is uncooperative and we are unable to obtain.

## 2015-03-01 NOTE — ED Provider Notes (Signed)
CSN: [id number]     Arrival date & time 03/01/15  1128 History   First MD Initiated Contact with Patient 03/01/15 1129     Chief Complaint  Patient presents with  . Loss of Consciousness     (Consider location/radiation/quality/duration/timing/severity/associated sxs/prior Treatment) HPI Comments: Patient is a 79 year old male with a past medical history of hypertension, previous stroke, and dementia who presents after a syncopal episode that occurred prior to arrival. [facility]. Per staff, patient was sitting in a chair in the activity room and lost consciousness and fell out of the chair to the floor. Unknown head trauma. On EMS arrival, patient was bradycardic with a first degree heart block on EKG and responsive at that time. Time of unconsciousness unknown. No other obvious injuries.    Level 5 caveat due to dementia    Past Medical History  Diagnosis Date  . Hypertension   . Stroke   . Dementia   . Urinary retention 09/22/2014   Past Surgical History  Procedure Laterality Date  . Inguinal hernia repair N/A 06/20/2014    Procedure: LAPAROSCOPIC BILATERAL INGUINAL HERNIA REPAIR, LEFT FEMORAL AND INCARCERATED SUPRA UMBILICAL HERNIA REPAIR WITH MESH;  Surgeon: Karie SodaSteven Gross, MD;  Location: [facility];  Service: General;  Laterality: N/A;  . Insertion of mesh N/A 06/20/2014    Procedure: INSERTION OF MESH;  Surgeon: Karie SodaSteven Gross, MD;  Location: [facility];  Service: General;  Laterality: N/A;   Family History  Problem Relation Age of Onset  . Stroke Mother   . Hypertension Mother    History  Substance Use Topics  . Smoking status: Never Smoker   . Smokeless tobacco: Never Used  . Alcohol Use: No    Review of Systems  Unable to perform ROS: Dementia      Allergies  Review of patient's allergies indicates no known allergies.  Home Medications   Prior to Admission medications   Medication Sig Start Date End Date Taking? Authorizing Provider   acetaminophen (TYLENOL) 500 MG tablet Take 500 mg by mouth every 4 (four) hours as needed for mild pain or moderate pain (pain).    Yes Historical Provider, MD  alum & mag hydroxide-simeth (MAALOX/MYLANTA) 200-200-20 MG/5ML suspension Take 30 mLs by mouth every 6 (six) hours as needed for indigestion or heartburn.    Yes Historical Provider, MD  aspirin 81 MG chewable tablet Chew 81 mg by mouth daily.   Yes Historical Provider, MD  cholecalciferol (VITAMIN D) 1000 UNITS tablet Take 1,000 Units by mouth daily.   Yes Historical Provider, MD  divalproex (DEPAKOTE SPRINKLE) 125 MG capsule Take 125 mg by mouth 2 (two) times daily.   Yes Historical Provider, MD  donepezil (ARICEPT) 10 MG tablet Take 10 mg by mouth at bedtime.   Yes Historical Provider, MD  guaifenesin (ROBITUSSIN) 100 MG/5ML syrup Take 200 mg by mouth 3 (three) times daily as needed for cough.   Yes Historical Provider, MD  loperamide (IMODIUM) 2 MG capsule Take 2 mg by mouth as needed for diarrhea or loose stools.    Yes Historical Provider, MD  magnesium hydroxide (MILK OF MAGNESIA) 400 MG/5ML suspension Take 30 mLs by mouth at bedtime as needed for mild constipation.    Yes Historical Provider, MD  mirtazapine (REMERON) 30 MG tablet Take 30 mg by mouth at bedtime.   Yes Historical Provider, MD  neomycin-bacitracin-polymyxin (NEOSPORIN) [phone] ointment Apply 1 application topically daily as needed (for skin tears,abrasions,minor irritation).  Yes Historical Provider, MD  Skin Protectants, Misc. (MINERIN) CREA Apply 1 application topically daily. To entire body   Yes Historical Provider, MD  tamsulosin (FLOMAX) 0.4 MG CAPS capsule Take 1 capsule (0.4 mg total) by mouth daily. Patient taking differently: Take 0.8 mg by mouth daily.  09/27/14  Yes Brittainy Sherlynn Carbon, PA-C  traMADol (ULTRAM) 50 MG tablet Take 100 mg by mouth every 6 (six) hours as needed for moderate pain or severe pain (pain). 06/20/14  Yes Karie Soda, MD   BP  128/55 mmHg  Pulse 50  Temp(Src) 97.4 F (36.3 C) (Oral)  Resp 20  SpO2 100% Physical Exam  Constitutional: He is oriented to person, place, and time. He appears well-developed and well-nourished. No distress.  HENT:  Head: Normocephalic and atraumatic.  Eyes: Conjunctivae and EOM are normal. Pupils are equal, round, and reactive to light.  Neck: Normal range of motion.  Cardiovascular: Regular rhythm.  Exam reveals no gallop and no friction rub.   No murmur heard. Bradycardia  Pulmonary/Chest: Effort normal and breath sounds normal. He has no wheezes. He has no rales. He exhibits no tenderness.  Abdominal: Soft. He exhibits no distension. There is no tenderness. There is no rebound.  Musculoskeletal: Normal range of motion.  Neurological: He is alert and oriented to person, place, and time. Coordination normal.  Speech is goal-oriented. Moves limbs without ataxia.   Skin: Skin is warm and dry.  Psychiatric:  Patient is pleasant. Difficult to assess due to dementia.   Nursing note and vitals reviewed.   ED Course  Procedures (including critical care time) Labs Review Labs Reviewed  BASIC METABOLIC PANEL - Abnormal; Notable for the following:    Glucose, Bld 104 (*)    Creatinine, Ser 1.37 (*)    GFR calc non Af Amer 49 (*)    GFR calc Af Amer 56 (*)    All other components within normal limits  CBC WITH DIFFERENTIAL/PLATELET  URINALYSIS, ROUTINE W REFLEX MICROSCOPIC  I-STAT TROPOININ, ED    Imaging Review Dg Chest 2 View  03/01/2015   CLINICAL DATA:  Syncope and hypertension  EXAM: CHEST  2 VIEW  COMPARISON:  September 21, 2014  FINDINGS: There is no edema or consolidation. The heart size and pulmonary vascularity are normal. There is atherosclerotic change in the aortic arch region. No adenopathy. There is degenerative change in the thoracic spine.  IMPRESSION: No edema or consolidation.   Electronically Signed   By: Bretta Bang III M.D.   On: 03/01/2015 13:33   Ct  Head Wo Contrast  03/01/2015   CLINICAL DATA:  Syncope today with a fall.  Initial encounter.  EXAM: CT HEAD WITHOUT CONTRAST  TECHNIQUE: Contiguous axial images were obtained from the base of the skull through the vertex without intravenous contrast.  COMPARISON:  Head CT scan 11/22/2014 and 09/02/2013.  FINDINGS: Atrophy, chronic microvascular ischemic change and remote right frontal infarct are all again seen. No evidence of acute abnormality including infarction, hemorrhage, mass lesion, mass effect, midline shift or abnormal extra-axial fluid collection is identified. There is no hydrocephalus or pneumocephalus. The calvarium is intact. Imaged paranasal sinuses and mastoid air cells are clear.  IMPRESSION: No acute abnormality.  Atrophy, chronic microvascular ischemic change and remote right frontal infarct.   Electronically Signed   By: Drusilla Kanner M.D.   On: 03/01/2015 13:15     EKG Interpretation   Date/Time:  Wednesday Mar 01 2015 11:33:33 EDT Ventricular Rate:  49 PR Interval:  251 QRS Duration: 81 QT Interval:  459 QTC Calculation: 414 R Axis:   70 Text Interpretation:  Sinus bradycardia Prolonged PR interval Anteroseptal  infarct, old ST elevation, consider inferior injury Baseline wander in  lead(s) aVL No significant change since last tracing Confirmed by ALLEN   MD, ANTHONY ([id number]) on 03/01/2015 1:31:06 PM      MDM   Final diagnoses:  Syncope  Syncope and collapse  Bradycardia    11:50 AM Labs, chest xray, CT head pending. Patient bradycardic with remaining vitals stable.   Patient admitted for further evaluation.    Emilia Beck, PA-C 03/01/15 1513  Lorre Nick, MD 03/04/15 [phone]

## 2015-03-01 NOTE — H&P (Signed)
[facility] History and Physical                                                                                    Jacob York, is a 79 y.o. male  MRN: [id number]   DOB - [birth year]  Admit Date - 03/01/2015  Outpatient Primary MD for the patient is Ron Parker, MD  Referring MD: Avelina Laine  With History of -  Past Medical History  Diagnosis Date  . Hypertension   . Stroke   . Dementia   . Urinary retention 09/22/2014      Past Surgical History  Procedure Laterality Date  . Inguinal hernia repair N/A 06/20/2014    Procedure: LAPAROSCOPIC BILATERAL INGUINAL HERNIA REPAIR, LEFT FEMORAL AND INCARCERATED SUPRA UMBILICAL HERNIA REPAIR WITH MESH;  Surgeon: Karie Soda, MD;  Location: [facility];  Service: General;  Laterality: N/A;  . Insertion of mesh N/A 06/20/2014    Procedure: INSERTION OF MESH;  Surgeon: Karie Soda, MD;  Location: [facility];  Service: General;  Laterality: N/A;    in for   Chief Complaint  Patient presents with  . Loss of Consciousness     HPI This is a 79 year old male patient resident of a skilled nursing facility with past medical history of hypertension, dementia, chronic kidney disease stage III as well as CVA and urinary retention. Patient has a history of symptomatic bradycardia dating back to December 2015. Full workup done at that admission and bradycardia was felt to be related to AV nodal blocking agents the patient was on prior to admission so these were discontinued and patient was discharged without any further recurrence of bradycardia. He was sent back to the ER today after experiencing a syncopal episode while sitting in a chair. He apparently had loss of consciousness for several minutes.  Upon arrival to the ER the patient was bradycardiac with a first degree AV block and was responsive. His blood pressure was a little soft at 107/58 and his heart rate was 48. He was afebrile. He is maintaining room air saturations of 100%. His initial EKG  revealed sinus bradycardia with prolonged PR intervals. Since arrival to telemetry has improved and heart rate is maintaining in the 60s and no evidence of any second or third degree heart block has been seen. History has been unobtainable due to patient's history of dementia. Further complicating his evaluation has been his combativeness. He has not allowed any of his providers to perform a full complete exam on him or other therapeutic measures. Several orders placed by the ER staff had been unable to be accomplished because patient is refusing intervention. When I entered the room and attempted to communicate with the patient he became very angry and said he "was not going to do anything"and began trying to rip off his pulse ox and his telemetry leads. I began to speak to him in a calming voice and told him that would be okay and then he flipped over on his side and said thank you. When I reattempted to examine him and place a stethoscope on his chest he took the stethoscope and attempted to rip it into pieces but was unsuccessful;  I was eventually able to reobtain my stethoscope from the patient. This has been a consistent pattern with all providers coming in the room occluding my attending.   Review of Systems   In addition to the HPI above,  Unable to obtain due to patient's inability to participate because of combativeness and altered mentation in setting of chronic severe dementia  *A full 10 point Review of Systems was done, except as stated above, all other Review of Systems were negative.  Social History History  Substance Use Topics  . Smoking status: Never Smoker   . Smokeless tobacco: Never Used  . Alcohol Use: No    Resides at: [facility] skilled nursing facility  Lives with: N/A  Ambulatory status: Unclear if patient was ambulatory prior to admission i.e. with assistance and assistive devices vs primarily in a wheelchair   Family History Family History  Problem Relation  Age of Onset  . Stroke Mother   . Hypertension Mother    **Please note family history and social history obtained from documentation from previous admissions  Prior to Admission medications   Medication Sig Start Date End Date Taking? Authorizing Provider  acetaminophen (TYLENOL) 500 MG tablet Take 500 mg by mouth every 4 (four) hours as needed for mild pain or moderate pain (pain).    Yes Historical Provider, MD  alum & mag hydroxide-simeth (MAALOX/MYLANTA) 200-200-20 MG/5ML suspension Take 30 mLs by mouth every 6 (six) hours as needed for indigestion or heartburn.    Yes Historical Provider, MD  aspirin 81 MG chewable tablet Chew 81 mg by mouth daily.   Yes Historical Provider, MD  cholecalciferol (VITAMIN D) 1000 UNITS tablet Take 1,000 Units by mouth daily.   Yes Historical Provider, MD  divalproex (DEPAKOTE SPRINKLE) 125 MG capsule Take 125 mg by mouth 2 (two) times daily.   Yes Historical Provider, MD  donepezil (ARICEPT) 10 MG tablet Take 10 mg by mouth at bedtime.   Yes Historical Provider, MD  guaifenesin (ROBITUSSIN) 100 MG/5ML syrup Take 200 mg by mouth 3 (three) times daily as needed for cough.   Yes Historical Provider, MD  loperamide (IMODIUM) 2 MG capsule Take 2 mg by mouth as needed for diarrhea or loose stools.    Yes Historical Provider, MD  magnesium hydroxide (MILK OF MAGNESIA) 400 MG/5ML suspension Take 30 mLs by mouth at bedtime as needed for mild constipation.    Yes Historical Provider, MD  mirtazapine (REMERON) 30 MG tablet Take 30 mg by mouth at bedtime.   Yes Historical Provider, MD  neomycin-bacitracin-polymyxin (NEOSPORIN) [phone] ointment Apply 1 application topically daily as needed (for skin tears,abrasions,minor irritation).    Yes Historical Provider, MD  Skin Protectants, Misc. (MINERIN) CREA Apply 1 application topically daily. To entire body   Yes Historical Provider, MD  tamsulosin (FLOMAX) 0.4 MG CAPS capsule Take 1 capsule (0.4 mg total) by mouth  daily. Patient taking differently: Take 0.8 mg by mouth daily.  09/27/14  Yes Brittainy Sherlynn Carbon, PA-C  traMADol (ULTRAM) 50 MG tablet Take 100 mg by mouth every 6 (six) hours as needed for moderate pain or severe pain (pain). 06/20/14  Yes Karie Soda, MD    No Known Allergies  Physical Exam  Vitals  Blood pressure 100/53, pulse 70, temperature 97.4 F (36.3 C), temperature source Oral, resp. rate 21, SpO2 95 %.   General:  In no acute distress, appears healthy and well nourished-easily agitated and becomes combative with attempts to examine or interact him  Psych:  Easily agitated affect, appears confused has no insight into location or reason for presentation to the hospital; because of underlying dementia unable to complete thorough examination  Neuro:   Appears to have No gross focal neurological deficits adding patient able to turn self in bed and appears to be moving all 4 extremities equally, CN II through XII intact, Strength 5/5 all 4 extremities, Sensation intact all 4 extremities.  ENT:  Ears and Eyes appear Normal based on limited inspection, Conjunctivae clear, PER. Moist oral mucosa without erythema or exudates.  Neck:  Supple, No lymphadenopathy appreciated  Respiratory:  Symmetrical chest wall movement and inspection, but unable to auscultate lung sounds since patient not cooperating with exam. Room Air  Cardiac: Unable to auscultate heart tones, telemetry reveals sinus rhythm, on quick gross inspection of lower extremities no apparent edema  Abdomen:  Unable to complete exam due to patient's uncooperative behavior  Skin:  No apparent Cyanosis, Normal Skin Turgor, No Skin Rash or Bruise based on brief inspection-unable to remove all of patient's clothing because of agitation  Extremities: Appear symmetrical without obvious trauma or injury,  no effusions based on brief inspection  Data Review  CBC  Recent Labs Lab 03/01/15 1148  WBC 7.7  HGB 14.1  HCT 41.1   PLT 188  MCV 87.8  MCH 30.1  MCHC 34.3  RDW 14.0  LYMPHSABS 1.6  MONOABS 0.5  EOSABS 0.2  BASOSABS 0.1    Chemistries   Recent Labs Lab 03/01/15 1148  NA 140  K 3.5  CL 106  CO2 25  GLUCOSE 104*  BUN 20  CREATININE 1.37*  CALCIUM 9.3    CrCl cannot be calculated (Unknown ideal weight.).  No results for input(s): TSH, T4TOTAL, T3FREE, THYROIDAB in the last 72 hours.  Invalid input(s): FREET3  Coagulation profile No results for input(s): INR, PROTIME in the last 168 hours.  No results for input(s): DDIMER in the last 72 hours.  Cardiac Enzymes No results for input(s): CKMB, TROPONINI, MYOGLOBIN in the last 168 hours.  Invalid input(s): CK  Invalid input(s): POCBNP  Urinalysis    Component Value Date/Time   COLORURINE YELLOW 11/22/2014 2254   APPEARANCEUR TURBID* 11/22/2014 2254   LABSPEC 1.005 11/22/2014 2254   PHURINE 7.0 11/22/2014 2254   GLUCOSEU NEGATIVE 11/22/2014 2254   HGBUR TRACE* 11/22/2014 2254   BILIRUBINUR NEGATIVE 11/22/2014 2254   KETONESUR NEGATIVE 11/22/2014 2254   PROTEINUR NEGATIVE 11/22/2014 2254   UROBILINOGEN 1.0 11/22/2014 2254   NITRITE NEGATIVE 11/22/2014 2254   LEUKOCYTESUR MODERATE* 11/22/2014 2254    Imaging results:   Dg Chest 2 View  03/01/2015   CLINICAL DATA:  Syncope and hypertension  EXAM: CHEST  2 VIEW  COMPARISON:  September 21, 2014  FINDINGS: There is no edema or consolidation. The heart size and pulmonary vascularity are normal. There is atherosclerotic change in the aortic arch region. No adenopathy. There is degenerative change in the thoracic spine.  IMPRESSION: No edema or consolidation.   Electronically Signed   By: Bretta BangWilliam  Woodruff III M.D.   On: 03/01/2015 13:33   Ct Head Wo Contrast  03/01/2015   CLINICAL DATA:  Syncope today with a fall.  Initial encounter.  EXAM: CT HEAD WITHOUT CONTRAST  TECHNIQUE: Contiguous axial images were obtained from the base of the skull through the vertex without intravenous  contrast.  COMPARISON:  Head CT scan 11/22/2014 and 09/02/2013.  FINDINGS: Atrophy, chronic microvascular ischemic change and remote right frontal infarct are all again seen.  No evidence of acute abnormality including infarction, hemorrhage, mass lesion, mass effect, midline shift or abnormal extra-axial fluid collection is identified. There is no hydrocephalus or pneumocephalus. The calvarium is intact. Imaged paranasal sinuses and mastoid air cells are clear.  IMPRESSION: No acute abnormality.  Atrophy, chronic microvascular ischemic change and remote right frontal infarct.   Electronically Signed   By: Drusilla Kanner M.D.   On: 03/01/2015 13:15     EKG: (Independently reviewed) sinus bradycardia with prolonged pedal R interval with rate 48 bpm QTc 414 ms   Assessment & Plan  Principal Problem:   Syncope -Admit to telemetry -No clear-cut etiology at this juncture but patient certainly on several medications that could precipitate orthostasis including his dementia medications and his Flomax -Flomax dosage was 0.8 mg so we'll decrease this to 0.4 mg -Administer IV fluids and attempt to check orthostatic vital signs if patient will anticipate -Patient also with bradycardia presentation and has a history of symptomatic bradycardia leading to syncope in December 2015; a shock currently not on any AV nodal blocking agents-tended to follow telemetry to monitor for other arrhythmia such as transient heart block that may have occurred preceding the syncopal event at the nursing facility -Had 2-D echocardiogram in December so no indication to repeat at this juncture  Active Problems:   Dementia with behavioral disturbance -For now will continue Remeron, Aricept and Depakote -Provide when necessary Haldol for more aggressive behaviors noting QTc appropriate at 414 ms    Bradycardia -May have contributed to syncopal episode -Have decreased Flomax dosage as noted above an event alpha blockade  contributed -Continue telemetry and monitoring    Hypertension -Blood pressure at presentation and since arrival has been soft with a low of 99/61 and a high of 107/58 -Continue to monitor; possibly patient had orthostatic hypotension and may require adjustment in any hypertensive medications as has been done with the Flomax -Was not on any other any hypertensive medications prior to admission    Chronic renal insufficiency, stage III (moderate) -Renal function slightly higher than baseline creatinine of 1.01 -IV fluid as noted above    H/O urinary retention -Has not voided since arrival -Attempt to obtain urinalysis and culture -Continue Flomax but at half preadmission dosage    DVT Prophylaxis: Lovenox  Family Communication: No family at bedside     Code Status:  Full code based on previous documentation-please note patient does not have the capacity to make this type of decision  Condition:  Stable  Discharge disposition: Return back to preadmission skilled nursing facility once etiology of syncope has been determined  Time spent in minutes : 60      Edona Schreffler L. ANP on 03/01/2015 at 3:21 PM  Between 7am to 7pm - Pager - [phone]  After 7pm go to [email or web address] - password [email or web address]  And look for the night coverage person covering me after hours  [facility]

## 2015-03-02 DIAGNOSIS — N189 Chronic kidney disease, unspecified: Secondary | ICD-10-CM

## 2015-03-02 DIAGNOSIS — R001 Bradycardia, unspecified: Secondary | ICD-10-CM

## 2015-03-02 DIAGNOSIS — Z87448 Personal history of other diseases of urinary system: Secondary | ICD-10-CM

## 2015-03-02 DIAGNOSIS — F0391 Unspecified dementia with behavioral disturbance: Secondary | ICD-10-CM

## 2015-03-02 DIAGNOSIS — I10 Essential (primary) hypertension: Secondary | ICD-10-CM

## 2015-03-02 DIAGNOSIS — R55 Syncope and collapse: Secondary | ICD-10-CM

## 2015-03-02 LAB — URINALYSIS, ROUTINE W REFLEX MICROSCOPIC
Bilirubin Urine: NEGATIVE
Glucose, UA: NEGATIVE mg/dL
KETONES UR: NEGATIVE mg/dL
Nitrite: NEGATIVE
PROTEIN: NEGATIVE mg/dL
SPECIFIC GRAVITY, URINE: 1.02 (ref 1.005–1.030)
Urobilinogen, UA: 1 mg/dL (ref 0.0–1.0)
pH: 5 (ref 5.0–8.0)

## 2015-03-02 LAB — URINE MICROSCOPIC-ADD ON

## 2015-03-02 LAB — TROPONIN I: Troponin I: <0.03 ng/mL (ref <0.031)

## 2015-03-02 LAB — MRSA PCR SCREENING: MRSA by PCR: POSITIVE — AB

## 2015-03-02 MED ORDER — LORAZEPAM 2 MG/ML IJ SOLN
1.0000 mg | INTRAMUSCULAR | Status: DC | PRN
  Administered 2015-03-03: 1 mg via INTRAVENOUS
  Filled 2015-03-02 (×2): qty 1

## 2015-03-02 MED ORDER — CHLORHEXIDINE GLUCONATE CLOTH 2 % EX PADS
6.0000 | MEDICATED_PAD | Freq: Every day | CUTANEOUS | Status: DC
  Administered 2015-03-03 – 2015-03-04 (×2): 6 via TOPICAL

## 2015-03-02 MED ORDER — LORAZEPAM 2 MG/ML IJ SOLN
1.0000 mg | INTRAMUSCULAR | Status: DC | PRN
  Administered 2015-03-02: 2 mg via INTRAMUSCULAR
  Filled 2015-03-02: qty 1

## 2015-03-02 MED ORDER — HALOPERIDOL LACTATE 5 MG/ML IJ SOLN
2.5000 mg | Freq: Once | INTRAMUSCULAR | Status: AC
  Administered 2015-03-02: 2.5 mg via INTRAMUSCULAR
  Filled 2015-03-02: qty 0.5

## 2015-03-02 MED ORDER — MUPIROCIN 2 % EX OINT
1.0000 "application " | TOPICAL_OINTMENT | Freq: Two times a day (BID) | CUTANEOUS | Status: DC
  Administered 2015-03-02 – 2015-03-04 (×4): 1 via NASAL
  Filled 2015-03-02 (×2): qty 22

## 2015-03-02 NOTE — Progress Notes (Signed)
Pt removed telemetry monitor and became very combative, screaming, hitting, and kicking staff when trying to put telemetry monitor back on, pt removed IV, catheter intact after removal, MD notified, MD ordered safety sitter, 2.5 mg of Haldol IM and 1-2 mg Ativan IM due to loss of IV access, orders implemented, pt now resting, call bell within reach, bed alarm and floor mats in use, next RN will continue to monitor.  Marcia BrashBrea Johnson, [place]RN 03/02/2015

## 2015-03-02 NOTE — Progress Notes (Signed)
[phone])- states that patient resides in their memory care unit and is welcome to return at time of DC.  CSW will continue to follow.  Merlyn LotJenna Holoman, LCSWA Clinical Social Worker [phone]

## 2015-03-02 NOTE — Progress Notes (Signed)
Utilization review completed.  

## 2015-03-02 NOTE — Significant Event (Signed)
Patient pulled on lines and tubes per previous RN report. Patient kept pulling off the telemetry leads, covering himself up in blankets. Not able to keep telemetry on at all.

## 2015-03-02 NOTE — Progress Notes (Signed)
[facility] PROGRESS NOTE   Jacob York AVW:[id number] DOB: 11-26-1935 DOA: 03/01/2015 PCP: Ron Parker, MD  HPI/Subjective: Patient was agitated earlier today, given Haldol and Ativan. Nursing staff was not able to place telemetry leads back on him. When evaluated him he was calm, as he Haldol and Ativan started to work on him.  Assessment/Plan: Principal Problem:   Syncope Active Problems:   Dementia with behavioral disturbance   Hypertension   Bradycardia   Chronic renal insufficiency, stage III (moderate)   H/O urinary retention   Syncope -Presented with syncopal episode while he was sitting in a chair. Unclear what happened. -Flomax dosage was 0.8 mg so we'll decrease this to 0.4 mg -Patient has severe dementia, not able to provide any type of history. -Had 2-D echocardiogram in December so no indication to repeat at this juncture. -Hydratewith IV fluids and recheck orthostatic vitals. -I am not sure with the poor history of getting from going to find out what happened, at least we can rule out ACS, orthostatic hypotension and then send him back to his ALF.   Dementia with behavioral disturbance -For now will continue Remeron, Aricept and Depakote -Provide when necessary Haldol for more aggressive behaviors noting QTc appropriate at 414 ms. -Ativan added for agitation.   Bradycardia -May have contributed to syncopal episode, on no receptor and also bradycardia, monitor closely on the telemetry. -Have decreased Flomax dosage as noted above an event alpha blockade contributed -I will hold off Aricept for now, it can cause bradycardia and syncope.   Hypertension -Blood pressure at presentation and since arrival has been soft with a low of 99/61 and a high of 107/58 -Continue to monitor; possibly patient had orthostatic hypotension and may require adjustment in any hypertensive medications as has been done with the Flomax -Was not on any other any  hypertensive medications prior to admission   Chronic renal insufficiency, stage III (moderate) -Renal function slightly higher than baseline creatinine of 1.01 -IV fluid as noted above    H/O urinary retention -Has not voided since arrival -Attempt to obtain urinalysis and culture -Continue Flomax but at half preadmission dosage, in and out cath if needed.  Code Status: Full Code Family Communication: Plan discussed with the patient. Disposition Plan: Remains inpatient Diet: Diet Heart Room service appropriate?: Yes; Fluid consistency:: Thin  Consultants:  None  Procedures:  None  Antibiotics:  None   Objective: Filed Vitals:   03/01/15 2135  BP: 109/60  Pulse: 60  Temp: 98.2 F (36.8 C)  Resp: 18    Intake/Output Summary (Last 24 hours) at 03/02/15 1357 Last data filed at 03/01/15 2000  Gross per 24 hour  Intake    240 ml  Output      0 ml  Net    240 ml   Filed Weights   03/01/15 1814  Weight: 69.8 kg (153 lb 14.1 oz)    Exam: General: Alert and awake, oriented x3, not in any acute distress. HEENT: anicteric sclera, pupils reactive to light and accommodation, EOMI CVS: S1-S2 clear, no murmur rubs or gallops Chest: clear to auscultation bilaterally, no wheezing, rales or rhonchi Abdomen: soft nontender, nondistended, normal bowel sounds, no organomegaly Extremities: no cyanosis, clubbing or edema noted bilaterally Neuro: Cranial nerves II-XII intact, no focal neurological deficits  Data Reviewed: Basic Metabolic Panel:  Recent Labs Lab 03/01/15 1148  NA 140  K 3.5  CL 106  CO2 25  GLUCOSE 104*  BUN 20  CREATININE 1.37*  CALCIUM 9.3  Liver Function Tests: No results for input(s): AST, ALT, ALKPHOS, BILITOT, PROT, ALBUMIN in the last 168 hours. No results for input(s): LIPASE, AMYLASE in the last 168 hours. No results for input(s): AMMONIA in the last 168 hours. CBC:  Recent Labs Lab 03/01/15 1148  WBC 7.7  NEUTROABS 5.3  HGB  14.1  HCT 41.1  MCV 87.8  PLT 188   Cardiac Enzymes: No results for input(s): CKTOTAL, CKMB, CKMBINDEX, TROPONINI in the last 168 hours. BNP (last 3 results) No results for input(s): BNP in the last 8760 hours.  ProBNP (last 3 results) No results for input(s): PROBNP in the last 8760 hours.  CBG: No results for input(s): GLUCAP in the last 168 hours.  Micro Recent Results (from the past 240 hour(s))  MRSA PCR Screening     Status: Abnormal   Collection Time: 03/01/15  7:49 PM  Result Value Ref Range Status   MRSA by PCR POSITIVE (A) NEGATIVE Final    Comment:        The GeneXpert MRSA Assay (FDA approved for NASAL specimens only), is one component of a comprehensive MRSA colonization surveillance program. It is not intended to diagnose MRSA infection nor to guide or monitor treatment for MRSA infections. RESULT CALLED TO, READ BACK BY AND VERIFIED WITH: B.JOHNSON AT 0017 BY L.PITT 03/01/16 RESULT CALLED TO, READ BACK BY AND VERIFIED WITH: B.JOHNSON,RN AT 0017 03/02/15 BY L.PITT      Studies: Dg Chest 2 View  03/01/2015   CLINICAL DATA:  Syncope and hypertension  EXAM: CHEST  2 VIEW  COMPARISON:  September 21, 2014  FINDINGS: There is no edema or consolidation. The heart size and pulmonary vascularity are normal. There is atherosclerotic change in the aortic arch region. No adenopathy. There is degenerative change in the thoracic spine.  IMPRESSION: No edema or consolidation.   Electronically Signed   By: Bretta BangWilliam  Woodruff III M.D.   On: 03/01/2015 13:33   Ct Head Wo Contrast  03/01/2015   CLINICAL DATA:  Syncope today with a fall.  Initial encounter.  EXAM: CT HEAD WITHOUT CONTRAST  TECHNIQUE: Contiguous axial images were obtained from the base of the skull through the vertex without intravenous contrast.  COMPARISON:  Head CT scan 11/22/2014 and 09/02/2013.  FINDINGS: Atrophy, chronic microvascular ischemic change and remote right frontal infarct are all again seen. No  evidence of acute abnormality including infarction, hemorrhage, mass lesion, mass effect, midline shift or abnormal extra-axial fluid collection is identified. There is no hydrocephalus or pneumocephalus. The calvarium is intact. Imaged paranasal sinuses and mastoid air cells are clear.  IMPRESSION: No acute abnormality.  Atrophy, chronic microvascular ischemic change and remote right frontal infarct.   Electronically Signed   By: Drusilla Kannerhomas  Dalessio M.D.   On: 03/01/2015 13:15    Scheduled Meds: . aspirin  81 mg Oral Daily  . divalproex  125 mg Oral BID  . enoxaparin (LOVENOX) injection  40 mg Subcutaneous Q24H  . mirtazapine  30 mg Oral QHS  . sodium chloride  3 mL Intravenous Q12H  . tamsulosin  0.4 mg Oral QPC supper   Continuous Infusions: . sodium chloride 75 mL/hr at 03/01/15 1926       Time spent: 35 minutes    [facility] Center[facility],Rozetta Stumpp A  [facility] Pager [phone] If 7PM-7AM, please contact night-coverage at [email or web address], password [email or web address] 03/02/2015, 1:57 PM  LOS: 1 day

## 2015-03-02 NOTE — Discharge Summary (Deleted)
[facility] PROGRESS NOTE   Jacob York:[id number] DOB: 1936/07/11 DOA: 03/01/2015 PCP: Ron Parker, MD  HPI/Subjective: Patient was agitated earlier today, given Haldol and Ativan. Nursing staff was not able to place telemetry leads back on him. When evaluated him he was calm, as he Haldol and Ativan started to work on him.  Assessment/Plan: Principal Problem:   Syncope Active Problems:   Dementia with behavioral disturbance   Hypertension   Bradycardia   Chronic renal insufficiency, stage III (moderate)   H/O urinary retention   Syncope -Presented with syncopal episode while he was sitting in a chair. Unclear what happened. -Flomax dosage was 0.8 mg so we'll decrease this to 0.4 mg -Patient has severe dementia, not able to provide any type of history. -Had 2-D echocardiogram in December so no indication to repeat at this juncture. -Hydratewith IV fluids and recheck orthostatic vitals. -I am not sure with the poor history of getting from going to find out what happened, at least we can rule out ACS, orthostatic hypotension and then send him back to his ALF.   Dementia with behavioral disturbance -For now will continue Remeron, Aricept and Depakote -Provide when necessary Haldol for more aggressive behaviors noting QTc appropriate at 414 ms. -Ativan added for agitation.   Bradycardia -May have contributed to syncopal episode, on no receptor and also bradycardia, monitor closely on the telemetry. -Have decreased Flomax dosage as noted above an event alpha blockade contributed -I will hold off Aricept for now, it can cause bradycardia and syncope.   Hypertension -Blood pressure at presentation and since arrival has been soft with a low of 99/61 and a high of 107/58 -Continue to monitor; possibly patient had orthostatic hypotension and may require adjustment in any hypertensive medications as has been done with the Flomax -Was not on any other any  hypertensive medications prior to admission   Chronic renal insufficiency, stage III (moderate) -Renal function slightly higher than baseline creatinine of 1.01 -IV fluid as noted above    H/O urinary retention -Has not voided since arrival -Attempt to obtain urinalysis and culture -Continue Flomax but at half preadmission dosage, in and out cath if needed.  Code Status: Full Code Family Communication: Plan discussed with the patient. Disposition Plan: Remains inpatient Diet: Diet Heart Room service appropriate?: Yes; Fluid consistency:: Thin  Consultants:  None  Procedures:  None  Antibiotics:  None   Objective: Filed Vitals:   03/01/15 2135  BP: 109/60  Pulse: 60  Temp: 98.2 F (36.8 C)  Resp: 18    Intake/Output Summary (Last 24 hours) at 03/02/15 1138 Last data filed at 03/01/15 2000  Gross per 24 hour  Intake    240 ml  Output      0 ml  Net    240 ml   Filed Weights   03/01/15 1814  Weight: 69.8 kg (153 lb 14.1 oz)    Exam: General: Alert and awake, oriented x3, not in any acute distress. HEENT: anicteric sclera, pupils reactive to light and accommodation, EOMI CVS: S1-S2 clear, no murmur rubs or gallops Chest: clear to auscultation bilaterally, no wheezing, rales or rhonchi Abdomen: soft nontender, nondistended, normal bowel sounds, no organomegaly Extremities: no cyanosis, clubbing or edema noted bilaterally Neuro: Cranial nerves II-XII intact, no focal neurological deficits  Data Reviewed: Basic Metabolic Panel:  Recent Labs Lab 03/01/15 1148  NA 140  K 3.5  CL 106  CO2 25  GLUCOSE 104*  BUN 20  CREATININE 1.37*  CALCIUM 9.3  Liver Function Tests: No results for input(s): AST, ALT, ALKPHOS, BILITOT, PROT, ALBUMIN in the last 168 hours. No results for input(s): LIPASE, AMYLASE in the last 168 hours. No results for input(s): AMMONIA in the last 168 hours. CBC:  Recent Labs Lab 03/01/15 1148  WBC 7.7  NEUTROABS 5.3  HGB  14.1  HCT 41.1  MCV 87.8  PLT 188   Cardiac Enzymes: No results for input(s): CKTOTAL, CKMB, CKMBINDEX, TROPONINI in the last 168 hours. BNP (last 3 results) No results for input(s): BNP in the last 8760 hours.  ProBNP (last 3 results) No results for input(s): PROBNP in the last 8760 hours.  CBG: No results for input(s): GLUCAP in the last 168 hours.  Micro Recent Results (from the past 240 hour(s))  MRSA PCR Screening     Status: Abnormal   Collection Time: 03/01/15  7:49 PM  Result Value Ref Range Status   MRSA by PCR POSITIVE (A) NEGATIVE Final    Comment:        The GeneXpert MRSA Assay (FDA approved for NASAL specimens only), is one component of a comprehensive MRSA colonization surveillance program. It is not intended to diagnose MRSA infection nor to guide or monitor treatment for MRSA infections. RESULT CALLED TO, READ BACK BY AND VERIFIED WITH: B.JOHNSON AT 0017 BY L.PITT 03/01/16 RESULT CALLED TO, READ BACK BY AND VERIFIED WITH: B.JOHNSON,RN AT 0017 03/02/15 BY L.PITT      Studies: Dg Chest 2 View  03/01/2015   CLINICAL DATA:  Syncope and hypertension  EXAM: CHEST  2 VIEW  COMPARISON:  September 21, 2014  FINDINGS: There is no edema or consolidation. The heart size and pulmonary vascularity are normal. There is atherosclerotic change in the aortic arch region. No adenopathy. There is degenerative change in the thoracic spine.  IMPRESSION: No edema or consolidation.   Electronically Signed   By: Bretta BangWilliam  Woodruff III M.D.   On: 03/01/2015 13:33   Ct Head Wo Contrast  03/01/2015   CLINICAL DATA:  Syncope today with a fall.  Initial encounter.  EXAM: CT HEAD WITHOUT CONTRAST  TECHNIQUE: Contiguous axial images were obtained from the base of the skull through the vertex without intravenous contrast.  COMPARISON:  Head CT scan 11/22/2014 and 09/02/2013.  FINDINGS: Atrophy, chronic microvascular ischemic change and remote right frontal infarct are all again seen. No  evidence of acute abnormality including infarction, hemorrhage, mass lesion, mass effect, midline shift or abnormal extra-axial fluid collection is identified. There is no hydrocephalus or pneumocephalus. The calvarium is intact. Imaged paranasal sinuses and mastoid air cells are clear.  IMPRESSION: No acute abnormality.  Atrophy, chronic microvascular ischemic change and remote right frontal infarct.   Electronically Signed   By: Drusilla Kannerhomas  Dalessio M.D.   On: 03/01/2015 13:15    Scheduled Meds: . aspirin  81 mg Oral Daily  . divalproex  125 mg Oral BID  . donepezil  10 mg Oral QHS  . enoxaparin (LOVENOX) injection  40 mg Subcutaneous Q24H  . mirtazapine  30 mg Oral QHS  . sodium chloride  3 mL Intravenous Q12H  . tamsulosin  0.4 mg Oral QPC supper   Continuous Infusions: . sodium chloride 75 mL/hr at 03/01/15 1926       Time spent: 35 minutes    [facility] CenterELMAHI,Chawn Spraggins A  [facility] Pager [phone] If 7PM-7AM, please contact night-coverage at [email or web address], password [email or web address] 03/02/2015, 11:38 AM  LOS: 1 day

## 2015-03-03 DIAGNOSIS — N390 Urinary tract infection, site not specified: Principal | ICD-10-CM

## 2015-03-03 LAB — BASIC METABOLIC PANEL
ANION GAP: 8 (ref 5–15)
BUN: 14 mg/dL (ref 6–20)
CALCIUM: 8.6 mg/dL — AB (ref 8.9–10.3)
CO2: 26 mmol/L (ref 22–32)
Chloride: 107 mmol/L (ref 101–111)
Creatinine, Ser: 1.11 mg/dL (ref 0.61–1.24)
GFR calc Af Amer: >60 mL/min (ref >60)
GLUCOSE: 88 mg/dL (ref 65–99)
Potassium: 3.1 mmol/L — ABNORMAL LOW (ref 3.5–5.1)
Sodium: 141 mmol/L (ref 135–145)

## 2015-03-03 MED ORDER — LORAZEPAM BOLUS VIA INFUSION: 1.0000 mg | Freq: Once | INTRAVENOUS | Status: DC

## 2015-03-03 MED ORDER — POTASSIUM CHLORIDE CRYS ER 20 MEQ PO TBCR
40.0000 meq | EXTENDED_RELEASE_TABLET | Freq: Four times a day (QID) | ORAL | Status: AC
  Administered 2015-03-03 (×2): 40 meq via ORAL
  Filled 2015-03-03 (×2): qty 2

## 2015-03-03 MED ORDER — CEFTRIAXONE SODIUM IN DEXTROSE 20 MG/ML IV SOLN
1.0000 g | INTRAVENOUS | Status: DC
  Administered 2015-03-03 – 2015-03-04 (×2): 1 g via INTRAVENOUS
  Filled 2015-03-03 (×2): qty 50

## 2015-03-03 MED ORDER — TRAZODONE HCL 50 MG PO TABS
50.0000 mg | ORAL_TABLET | Freq: Every day | ORAL | Status: DC
  Administered 2015-03-03: 50 mg via ORAL
  Filled 2015-03-03 (×2): qty 1

## 2015-03-03 MED ORDER — LORAZEPAM 2 MG/ML IJ SOLN
1.0000 mg | Freq: Once | INTRAMUSCULAR | Status: AC
  Administered 2015-03-03: 1 mg via INTRAVENOUS

## 2015-03-03 NOTE — Progress Notes (Signed)
Pt very restless and pulling out tele leads off . Ativan was given though pt initially loss acess and a new Iv line replaced  by Iv team  Pt rested for about  an hour and  Up again trying to get oob. Haldol 1 mg given  Tele  Leads reapplied but pt kept taking them off,CMT notified.& cardic monitor at a stand by/.

## 2015-03-03 NOTE — Progress Notes (Signed)
[facility] PROGRESS NOTE   Jacob York ZOX:[id number] DOB: Apr 18, 1936 DOA: 03/01/2015 PCP: Ron Parker, MD  HPI/Subjective: Seen with sitter at bedside, patient is sleeping, woke him up but he wants to go back to sleep. Per sitter he did not sleep last night, received Haldol and Ativan about 3 in the morning. We'll try to give something to help him sleep at night.  Assessment/Plan: Principal Problem:   Syncope Active Problems:   Dementia with behavioral disturbance   Hypertension   Bradycardia   Chronic renal insufficiency, stage III (moderate)   H/O urinary retention   UTI (lower urinary tract infection)   Syncope -Presented with syncopal episode while he was sitting in a chair. Unclear what happened. -Flomax dosage was 0.8 mg so we'll decrease this to 0.4 mg -Patient has severe dementia, not able to provide any type of history. -Had 2-D echocardiogram in December so no indication to repeat at this juncture. -Patient is getting hydration with IV fluids. -Probably the syncopal episode/weakness is secondary to UTI.  UTI -Urinalysis consistent with UTI, TNTC pus cells. -Started on Rocephin, can discharge on Ceftin.   Dementia with behavioral disturbance -For now will continue Remeron, Aricept and Depakote -Provide when necessary Haldol for more aggressive behaviors noting QTc appropriate at 414 ms. -Ativan added for agitation.   Bradycardia -May have contributed to syncopal episode, on no receptor and also bradycardia, monitor closely on the telemetry. -Have decreased Flomax dosage as noted above an event alpha blockade contributed -I will hold off Aricept for now, it can cause bradycardia and syncope.   Hypertension -Blood pressure at presentation and since arrival has been soft with a low of 99/61 and a high of 107/58 -Continue to monitor; possibly patient had orthostatic hypotension and may require adjustment in any hypertensive medications as has been  done with the Flomax -Was not on any other any hypertensive medications prior to admission   Chronic renal insufficiency, stage III (moderate) -Renal function slightly higher than baseline creatinine of 1.01 -IV fluid as noted above    H/O urinary retention -Has not voided since arrival -Attempt to obtain urinalysis and culture -Continue Flomax but at half preadmission dosage, in and out cath if needed.  Code Status: Full Code Family Communication: Plan discussed with the patient. Disposition Plan: Remains inpatient Diet: Diet Heart Room service appropriate?: Yes; Fluid consistency:: Thin  Consultants:  None  Procedures:  None  Antibiotics:  None   Objective: Filed Vitals:   03/03/15 0335  BP: 122/74  Pulse: 53  Temp: 97.9 F (36.6 C)  Resp: 18    Intake/Output Summary (Last 24 hours) at 03/03/15 1120 Last data filed at 03/02/15 2000  Gross per 24 hour  Intake    360 ml  Output    200 ml  Net    160 ml   Filed Weights   03/01/15 1814  Weight: 69.8 kg (153 lb 14.1 oz)    Exam: General: Alert and awake, oriented x3, not in any acute distress. HEENT: anicteric sclera, pupils reactive to light and accommodation, EOMI CVS: S1-S2 clear, no murmur rubs or gallops Chest: clear to auscultation bilaterally, no wheezing, rales or rhonchi Abdomen: soft nontender, nondistended, normal bowel sounds, no organomegaly Extremities: no cyanosis, clubbing or edema noted bilaterally Neuro: Cranial nerves II-XII intact, no focal neurological deficits  Data Reviewed: Basic Metabolic Panel:  Recent Labs Lab 03/01/15 1148 03/03/15 0520  NA 140 141  K 3.5 3.1*  CL 106 107  CO2 25 26  GLUCOSE 104* 88  BUN 20 14  CREATININE 1.37* 1.11  CALCIUM 9.3 8.6*   Liver Function Tests: No results for input(s): AST, ALT, ALKPHOS, BILITOT, PROT, ALBUMIN in the last 168 hours. No results for input(s): LIPASE, AMYLASE in the last 168 hours. No results for input(s): AMMONIA in  the last 168 hours. CBC:  Recent Labs Lab 03/01/15 1148  WBC 7.7  NEUTROABS 5.3  HGB 14.1  HCT 41.1  MCV 87.8  PLT 188   Cardiac Enzymes:  Recent Labs Lab 03/02/15 1239 03/02/15 1951  TROPONINI <0.03 <0.03   BNP (last 3 results) No results for input(s): BNP in the last 8760 hours.  ProBNP (last 3 results) No results for input(s): PROBNP in the last 8760 hours.  CBG: No results for input(s): GLUCAP in the last 168 hours.  Micro Recent Results (from the past 240 hour(s))  MRSA PCR Screening     Status: Abnormal   Collection Time: 03/01/15  7:49 PM  Result Value Ref Range Status   MRSA by PCR POSITIVE (A) NEGATIVE Final    Comment:        The GeneXpert MRSA Assay (FDA approved for NASAL specimens only), is one component of a comprehensive MRSA colonization surveillance program. It is not intended to diagnose MRSA infection nor to guide or monitor treatment for MRSA infections. RESULT CALLED TO, READ BACK BY AND VERIFIED WITH: B.JOHNSON AT 0017 BY L.PITT 03/01/16 RESULT CALLED TO, READ BACK BY AND VERIFIED WITH: B.JOHNSON,RN AT 0017 03/02/15 BY L.PITT      Studies: Dg Chest 2 View  03/01/2015   CLINICAL DATA:  Syncope and hypertension  EXAM: CHEST  2 VIEW  COMPARISON:  September 21, 2014  FINDINGS: There is no edema or consolidation. The heart size and pulmonary vascularity are normal. There is atherosclerotic change in the aortic arch region. No adenopathy. There is degenerative change in the thoracic spine.  IMPRESSION: No edema or consolidation.   Electronically Signed   By: Bretta BangWilliam  Woodruff III M.D.   On: 03/01/2015 13:33   Ct Head Wo Contrast  03/01/2015   CLINICAL DATA:  Syncope today with a fall.  Initial encounter.  EXAM: CT HEAD WITHOUT CONTRAST  TECHNIQUE: Contiguous axial images were obtained from the base of the skull through the vertex without intravenous contrast.  COMPARISON:  Head CT scan 11/22/2014 and 09/02/2013.  FINDINGS: Atrophy, chronic  microvascular ischemic change and remote right frontal infarct are all again seen. No evidence of acute abnormality including infarction, hemorrhage, mass lesion, mass effect, midline shift or abnormal extra-axial fluid collection is identified. There is no hydrocephalus or pneumocephalus. The calvarium is intact. Imaged paranasal sinuses and mastoid air cells are clear.  IMPRESSION: No acute abnormality.  Atrophy, chronic microvascular ischemic change and remote right frontal infarct.   Electronically Signed   By: Drusilla Kannerhomas  Dalessio M.D.   On: 03/01/2015 13:15    Scheduled Meds: . aspirin  81 mg Oral Daily  . cefTRIAXone (ROCEPHIN)  IV  1 g Intravenous Q24H  . Chlorhexidine Gluconate Cloth  6 each Topical Q0600  . divalproex  125 mg Oral BID  . enoxaparin (LOVENOX) injection  40 mg Subcutaneous Q24H  . mirtazapine  30 mg Oral QHS  . mupirocin ointment  1 application Nasal BID  . potassium chloride  40 mEq Oral Q6H  . sodium chloride  3 mL Intravenous Q12H  . tamsulosin  0.4 mg Oral QPC supper   Continuous Infusions: . sodium chloride 75 mL/hr at  03/01/15 1926       Time spent: 35 minutes    [facility] A  [facility] Pager [phone] If 7PM-7AM, please contact night-coverage at [email or web address], password [facility] 03/03/2015, 11:20 AM  LOS: 2 days

## 2015-03-04 MED ORDER — CEFUROXIME AXETIL 500 MG PO TABS: 500.0000 mg | ORAL_TABLET | Freq: Two times a day (BID) | ORAL | Status: DC

## 2015-03-04 NOTE — Clinical Social Work Note (Signed)
CSW attempted to call pt's son Dorinda HillDonald several time at [phone]. CSW was unsuccessful at reading him. CSW did left voice messages for Dorinda HillDonald. [facility]. CSW faxed clinicals and discharge summary. Bedside RN provided the number to call report. [facility] arranged for transport.   Cassidy Tabet, MSW, LCSWA [phone]

## 2015-03-04 NOTE — Progress Notes (Signed)
Pt prepared for d/c to SNF. IV d/c'd. Skin intact except as most recently charted. Vitals are stable. Report called to Paulene Floorressa Warren [facility]. Pt to be transported by ambulance service.  Coyle Stordahl RN

## 2015-03-04 NOTE — Clinical Social Work Note (Signed)
Clinical Social Work Assessment  Patient Details  Name: Jacob York MRN: [id number] Date of Birth: 11-30-1935  Date of referral:  03/04/15               Reason for consult:   Nida Boatman([facility] )              Housing/Transportation Living arrangements for the past 2 months:  Assisted Living Facility Source of Information:  Other (Comment Required) Delorise Jackson([facility] Patient Interpreter Needed:  None Criminal Activity/Legal Involvement Pertinent to Current Situation/Hospitalization:  No - Comment as needed Significant Relationships:  Adult Children Lives with:  Facility Resident  Care giving concerns:  N/A  Social Worker assessment / plan:  CSW attempted to call pt's son Dorinda HillDonald several time at [phone]. CSW was unsuccessful at reading him. CSW did left voice messages for Dorinda HillDonald. [facility] to them today. CSW faxed clinicals and discharge summary.   Patient/Family's Response to care:  N/A  Patient/Family's Understanding of and Emotional Response to Diagnosis, Current Treatment, and Prognosis:  N/A  Emotional Assessment Appearance:   (Unable to Assess) Attitude/Demeanor/Rapport:  Unable to Assess Affect (typically observed):  Unable to Assess Orientation:  Unable to Assess  Alcohol / Substance use:  Not Applicable Psych involvement (Current and /or in the community):  No (Comment)  Discharge Needs  Concerns to be addressed:  Denies Needs/Concerns at this time Current discharge risk:  None Barriers to Discharge:  No Barriers Identified   Glenyce Randle, LCSW 03/04/2015, 12:21 PM

## 2015-03-04 NOTE — Discharge Summary (Addendum)
Physician Discharge Summary  Jacob HatterBruce E York ZOX:[id number] DOB: 1935/10/22 DOA: 03/01/2015  PCP: Ron ParkerBOWEN,SAMUEL, MD  Admit date: 03/01/2015 Discharge date: 03/04/2015  Time spent: *40* minutes  Recommendations for Outpatient Follow-up:  1. Follow up with nursing home M.D. within 1 week. 2. Ceftin 500 mg by mouth twice a day for 5 more days.  Discharge Diagnoses:  Principal Problem:   Syncope Active Problems:   Dementia with behavioral disturbance   Hypertension   Bradycardia   Chronic renal insufficiency, stage III (moderate)   H/O urinary retention   UTI (lower urinary tract infection)   Discharge Condition: Stable  Diet recommendation: Heart healthy  Filed Weights   03/01/15 1814  Weight: 69.8 kg (153 lb 14.1 oz)    History of present illness:  This is a 79 year old male patient resident of a skilled nursing facility with past medical history of hypertension, dementia, chronic kidney disease stage III as well as CVA and urinary retention. Patient has a history of symptomatic bradycardia dating back to December 2015. Full workup done at that admission and bradycardia was felt to be related to AV nodal blocking agents the patient was on prior to admission so these were discontinued and patient was discharged without any further recurrence of bradycardia. He was sent back to the ER today after experiencing a syncopal episode while sitting in a chair. He apparently had loss of consciousness for several minutes.  Upon arrival to the ER the patient was bradycardiac with a first degree AV block and was responsive. His blood pressure was a little soft at 107/58 and his heart rate was 48. He was afebrile. He is maintaining room air saturations of 100%. His initial EKG revealed sinus bradycardia with prolonged PR intervals. Since arrival to telemetry has improved and heart rate is maintaining in the 60s and no evidence of any second or third degree heart block has been seen. History has been  unobtainable due to patient's history of dementia. Further complicating his evaluation has been his combativeness. He has not allowed any of his providers to perform a full complete exam on him or other therapeutic measures. Several orders placed by the ER staff had been unable to be accomplished because patient is refusing intervention.    Hospital Course:    Syncope -Presented with syncopal episode while he was sitting in a chair. Unclear what happened. -Flomax dosage was 0.8 mg so we'll decrease this to 0.4 mg -Patient has severe dementia, not able to provide any type of history. -Had 2-D echocardiogram in December so no indication to repeat at this juncture. -Patient is getting hydration with IV fluids. -Probably the syncopal episode/weakness is secondary to UTI.  UTI -Urinalysis consistent with UTI, TNTC pus cells. -Started on Rocephin, culture was not resulted back on time of discharge, discharged on Ceftin for 5 more days.   Dementia with behavioral disturbance -For now will continue Remeron, Aricept and Depakote -Patient has had some agitation especially at night likely acute delirium on top of his dementia. -This is improved prior to discharge, I think it will improve further when he gets to his usual surroundings.   Bradycardia -May have contributed to syncopal episode, on no receptor and also bradycardia. -Bradycardia monitored on telemetry, no heart rate less than 50. -If bradycardia continue please consider to discontinue Aricept, it can cause bradycardia and syncope.   Hypertension -Blood pressure at presentation and since arrival has been soft with a low of 99/61 and a high of 107/58 -Was not on any  other any hypertensive medications prior to admission.   Chronic renal insufficiency, stage III (moderate) -Renal function slightly higher than baseline creatinine of 1.0. Presented with creatinine of 1.37 -IV fluid given. Creatinine on discharge is 1.1.   H/O  urinary retention -Has not voided since arrival -Attempt to obtain urinalysis and culture -Continue Flomax. Urine showed too numerous to count WBC consistent with UTI.   Procedures:  None  Consultations:  None  Discharge Exam: Filed Vitals:   03/04/15 0340  BP: 159/76  Pulse: 51  Temp: 98.1 F (36.7 C)  Resp: 18   General: Alert and awake, disoriented x3, not in any acute distress. HEENT: anicteric sclera, pupils reactive to light and accommodation, EOMI CVS: S1-S2 clear, no murmur rubs or gallops Chest: clear to auscultation bilaterally, no wheezing, rales or rhonchi Abdomen: soft nontender, nondistended, normal bowel sounds, no organomegaly Extremities: no cyanosis, clubbing or edema noted bilaterally Neuro: Cranial nerves II-XII intact, no focal neurological deficits  Discharge Instructions   Discharge Instructions    Diet - low sodium heart healthy    Complete by:  As directed      Increase activity slowly    Complete by:  As directed           Current Discharge Medication List    START taking these medications   Details  cefUROXime (CEFTIN) 500 MG tablet Take 1 tablet (500 mg total) by mouth 2 (two) times daily with a meal. Qty: 10 tablet, Refills: 0      CONTINUE these medications which have NOT CHANGED   Details  acetaminophen (TYLENOL) 500 MG tablet Take 500 mg by mouth every 4 (four) hours as needed for mild pain or moderate pain (pain).     alum & mag hydroxide-simeth (MAALOX/MYLANTA) 200-200-20 MG/5ML suspension Take 30 mLs by mouth every 6 (six) hours as needed for indigestion or heartburn.     aspirin 81 MG chewable tablet Chew 81 mg by mouth daily.    cholecalciferol (VITAMIN D) 1000 UNITS tablet Take 1,000 Units by mouth daily.    divalproex (DEPAKOTE SPRINKLE) 125 MG capsule Take 125 mg by mouth 2 (two) times daily.    donepezil (ARICEPT) 10 MG tablet Take 10 mg by mouth at bedtime.    guaifenesin (ROBITUSSIN) 100 MG/5ML syrup Take 200  mg by mouth 3 (three) times daily as needed for cough.    loperamide (IMODIUM) 2 MG capsule Take 2 mg by mouth as needed for diarrhea or loose stools.     magnesium hydroxide (MILK OF MAGNESIA) 400 MG/5ML suspension Take 30 mLs by mouth at bedtime as needed for mild constipation.     mirtazapine (REMERON) 30 MG tablet Take 30 mg by mouth at bedtime.    neomycin-bacitracin-polymyxin (NEOSPORIN) [phone] ointment Apply 1 application topically daily as needed (for skin tears,abrasions,minor irritation).     Skin Protectants, Misc. (MINERIN) CREA Apply 1 application topically daily. To entire body    tamsulosin (FLOMAX) 0.4 MG CAPS capsule Take 1 capsule (0.4 mg total) by mouth daily. Qty: 30 capsule, Refills: 5    traMADol (ULTRAM) 50 MG tablet Take 100 mg by mouth every 6 (six) hours as needed for moderate pain or severe pain (pain).       No Known Allergies Follow-up Information    Follow up with [facility], MD In 1 week.   Specialty:  Internal Medicine       The results of significant diagnostics from this hospitalization (including imaging, microbiology, ancillary and laboratory) are  listed below for reference.    Significant Diagnostic Studies: Dg Chest 2 View  03/01/2015   CLINICAL DATA:  Syncope and hypertension  EXAM: CHEST  2 VIEW  COMPARISON:  September 21, 2014  FINDINGS: There is no edema or consolidation. The heart size and pulmonary vascularity are normal. There is atherosclerotic change in the aortic arch region. No adenopathy. There is degenerative change in the thoracic spine.  IMPRESSION: No edema or consolidation.   Electronically Signed   By: Bretta Bang III M.D.   On: 03/01/2015 13:33   Ct Head Wo Contrast  03/01/2015   CLINICAL DATA:  Syncope today with a fall.  Initial encounter.  EXAM: CT HEAD WITHOUT CONTRAST  TECHNIQUE: Contiguous axial images were obtained from the base of the skull through the vertex without intravenous contrast.  COMPARISON:  Head  CT scan 11/22/2014 and 09/02/2013.  FINDINGS: Atrophy, chronic microvascular ischemic change and remote right frontal infarct are all again seen. No evidence of acute abnormality including infarction, hemorrhage, mass lesion, mass effect, midline shift or abnormal extra-axial fluid collection is identified. There is no hydrocephalus or pneumocephalus. The calvarium is intact. Imaged paranasal sinuses and mastoid air cells are clear.  IMPRESSION: No acute abnormality.  Atrophy, chronic microvascular ischemic change and remote right frontal infarct.   Electronically Signed   By: Drusilla Kanner M.D.   On: 03/01/2015 13:15    Microbiology: Recent Results (from the past 240 hour(s))  MRSA PCR Screening     Status: Abnormal   Collection Time: 03/01/15  7:49 PM  Result Value Ref Range Status   MRSA by PCR POSITIVE (A) NEGATIVE Final    Comment:        The GeneXpert MRSA Assay (FDA approved for NASAL specimens only), is one component of a comprehensive MRSA colonization surveillance program. It is not intended to diagnose MRSA infection nor to guide or monitor treatment for MRSA infections. RESULT CALLED TO, READ BACK BY AND VERIFIED WITH: B.JOHNSON AT 0017 BY L.PITT 03/01/16 RESULT CALLED TO, READ BACK BY AND VERIFIED WITH: B.JOHNSON,RN AT 0017 03/02/15 BY L.PITT      Labs: Basic Metabolic Panel:  Recent Labs Lab 03/01/15 1148 03/03/15 0520  NA 140 141  K 3.5 3.1*  CL 106 107  CO2 25 26  GLUCOSE 104* 88  BUN 20 14  CREATININE 1.37* 1.11  CALCIUM 9.3 8.6*   Liver Function Tests: No results for input(s): AST, ALT, ALKPHOS, BILITOT, PROT, ALBUMIN in the last 168 hours. No results for input(s): LIPASE, AMYLASE in the last 168 hours. No results for input(s): AMMONIA in the last 168 hours. CBC:  Recent Labs Lab 03/01/15 1148  WBC 7.7  NEUTROABS 5.3  HGB 14.1  HCT 41.1  MCV 87.8  PLT 188   Cardiac Enzymes:  Recent Labs Lab 03/02/15 1239 03/02/15 1951  TROPONINI  <0.03 <0.03   BNP: BNP (last 3 results) No results for input(s): BNP in the last 8760 hours.  ProBNP (last 3 results) No results for input(s): PROBNP in the last 8760 hours.  CBG: No results for input(s): GLUCAP in the last 168 hours.     Signed:  Mustapha Colson A  [facility] 03/04/2015, 11:08 AM

## 2015-03-05 LAB — URINE CULTURE: Colony Count: >=100000

## 2015-04-15 ENCOUNTER — Emergency Department (HOSPITAL_COMMUNITY)
Admission: EM | Admit: 2015-04-15 | Discharge: 2015-04-15 | Disposition: A | Discharge status: Home / Self Care | Payer: Medicare Other | Attending: Emergency Medicine | Admitting: Emergency Medicine

## 2015-04-15 ENCOUNTER — Encounter (HOSPITAL_COMMUNITY): Payer: Self-pay

## 2015-04-15 ENCOUNTER — Emergency Department (HOSPITAL_COMMUNITY): Payer: Medicare Other

## 2015-04-15 DIAGNOSIS — Z8673 Personal history of transient ischemic attack (TIA), and cerebral infarction without residual deficits: Secondary | ICD-10-CM | POA: Insufficient documentation

## 2015-04-15 DIAGNOSIS — Z043 Encounter for examination and observation following other accident: Secondary | ICD-10-CM | POA: Diagnosis not present

## 2015-04-15 DIAGNOSIS — Y9301 Activity, walking, marching and hiking: Secondary | ICD-10-CM | POA: Insufficient documentation

## 2015-04-15 DIAGNOSIS — Z7982 Long term (current) use of aspirin: Secondary | ICD-10-CM | POA: Diagnosis not present

## 2015-04-15 DIAGNOSIS — Y92129 Unspecified place in nursing home as the place of occurrence of the external cause: Secondary | ICD-10-CM

## 2015-04-15 DIAGNOSIS — Z79899 Other long term (current) drug therapy: Secondary | ICD-10-CM | POA: Diagnosis not present

## 2015-04-15 DIAGNOSIS — W1839XA Other fall on same level, initial encounter: Secondary | ICD-10-CM | POA: Diagnosis not present

## 2015-04-15 DIAGNOSIS — Y92128 Other place in nursing home as the place of occurrence of the external cause: Secondary | ICD-10-CM | POA: Diagnosis not present

## 2015-04-15 DIAGNOSIS — Y998 Other external cause status: Secondary | ICD-10-CM | POA: Insufficient documentation

## 2015-04-15 DIAGNOSIS — F0391 Unspecified dementia with behavioral disturbance: Secondary | ICD-10-CM | POA: Diagnosis not present

## 2015-04-15 DIAGNOSIS — I10 Essential (primary) hypertension: Secondary | ICD-10-CM | POA: Diagnosis not present

## 2015-04-15 DIAGNOSIS — F03918 Unspecified dementia, unspecified severity, with other behavioral disturbance: Secondary | ICD-10-CM

## 2015-04-15 DIAGNOSIS — W19XXXA Unspecified fall, initial encounter: Secondary | ICD-10-CM

## 2015-04-15 MED ORDER — HALOPERIDOL LACTATE 5 MG/ML IJ SOLN
5.0000 mg | Freq: Once | INTRAMUSCULAR | Status: AC
  Administered 2015-04-15: 5 mg via INTRAMUSCULAR
  Filled 2015-04-15: qty 1

## 2015-04-15 NOTE — ED Provider Notes (Signed)
CSN: [id number]     Arrival date & time 04/15/15  1840 History   First MD Initiated Contact with Patient 04/15/15 1858     Chief Complaint  Patient presents with  . Fall     (Consider location/radiation/quality/duration/timing/severity/associated sxs/prior Treatment) HPI Comments: Pt from [facility] vis EMS post fall. Per EMS, fall was witnessed and sts that pt was walking down the hall and fell. Pt hit back of head but had no LOC. Pt does not take blood thinners. Pt has hx of dementia   Patient is a 79 y.o. male presenting with fall. The history is provided by the patient. No language interpreter was used.  Fall This is a new problem. The current episode started less than 1 hour ago. The problem occurs rarely. The problem has not changed since onset.Pertinent negatives include no chest pain, no abdominal pain, no headaches and no shortness of breath. Associated symptoms comments: At first denies pain, then states he has pain all over . Nothing aggravates the symptoms. Nothing relieves the symptoms. He has tried nothing for the symptoms. The treatment provided no relief.    Past Medical History  Diagnosis Date  . Hypertension   . Stroke   . Dementia   . Urinary retention 09/22/2014   Past Surgical History  Procedure Laterality Date  . Inguinal hernia repair N/A 06/20/2014    Procedure: LAPAROSCOPIC BILATERAL INGUINAL HERNIA REPAIR, LEFT FEMORAL AND INCARCERATED SUPRA UMBILICAL HERNIA REPAIR WITH MESH;  Surgeon: Karie Soda, MD;  Location: [facility];  Service: General;  Laterality: N/A;  . Insertion of mesh N/A 06/20/2014    Procedure: INSERTION OF MESH;  Surgeon: Karie Soda, MD;  Location: [facility];  Service: General;  Laterality: N/A;   Family History  Problem Relation Age of Onset  . Stroke Mother   . Hypertension Mother    History  Substance Use Topics  . Smoking status: Never Smoker   . Smokeless tobacco: Never Used  . Alcohol Use: No    Review of Systems   Constitutional: Negative for fever, activity change, appetite change and fatigue.  HENT: Negative for congestion, facial swelling, rhinorrhea and trouble swallowing.   Eyes: Negative for photophobia and pain.  Respiratory: Negative for cough, chest tightness and shortness of breath.   Cardiovascular: Negative for chest pain and leg swelling.  Gastrointestinal: Negative for nausea, vomiting, abdominal pain, diarrhea and constipation.  Endocrine: Negative for polydipsia and polyuria.  Genitourinary: Negative for dysuria, urgency, decreased urine volume and difficulty urinating.  Musculoskeletal: Negative for back pain and gait problem.  Skin: Negative for color change, rash and wound.  Allergic/Immunologic: Negative for immunocompromised state.  Neurological: Negative for dizziness, facial asymmetry, speech difficulty, weakness, numbness and headaches.  Psychiatric/Behavioral: Negative for confusion, decreased concentration and agitation.      Allergies  Review of patient's allergies indicates no known allergies.  Home Medications   Prior to Admission medications   Medication Sig Start Date End Date Taking? Authorizing Provider  acetaminophen (TYLENOL) 500 MG tablet Take 500 mg by mouth every 4 (four) hours as needed for mild pain or moderate pain (pain).    Yes Historical Provider, MD  alum & mag hydroxide-simeth (MAALOX/MYLANTA) 200-200-20 MG/5ML suspension Take 30 mLs by mouth every 6 (six) hours as needed for indigestion or heartburn.    Yes Historical Provider, MD  aspirin 81 MG chewable tablet Chew 81 mg by mouth daily.   Yes Historical Provider, MD  cetirizine (ZYRTEC) 10 MG tablet Take 10 mg by  mouth daily as needed (itching).   Yes Historical Provider, MD  cholecalciferol (VITAMIN D) 1000 UNITS tablet Take 1,000 Units by mouth daily.   Yes Historical Provider, MD  divalproex (DEPAKOTE SPRINKLE) 125 MG capsule Take 125 mg by mouth 2 (two) times daily.   Yes Historical Provider,  MD  guaifenesin (ROBITUSSIN) 100 MG/5ML syrup Take 200 mg by mouth 3 (three) times daily as needed for cough.   Yes Historical Provider, MD  loperamide (IMODIUM) 2 MG capsule Take 2 mg by mouth as needed for diarrhea or loose stools.    Yes Historical Provider, MD  magnesium hydroxide (MILK OF MAGNESIA) 400 MG/5ML suspension Take 30 mLs by mouth at bedtime as needed for mild constipation.    Yes Historical Provider, MD  mirtazapine (REMERON) 15 MG tablet Take 15 mg by mouth at bedtime.   Yes Historical Provider, MD  neomycin-bacitracin-polymyxin (NEOSPORIN) [phone] ointment Apply 1 application topically daily as needed (for skin tears,abrasions,minor irritation).    Yes Historical Provider, MD  Nutritional Supplements (GLUCERNA SHAKE PO) Take 1 each by mouth 3 (three) times daily. Mighty Shakes   Yes Historical Provider, MD  Skin Protectants, Misc. (MINERIN) CREA Apply 1 application topically daily. To entire body   Yes Historical Provider, MD  tamsulosin (FLOMAX) 0.4 MG CAPS capsule Take 1 capsule (0.4 mg total) by mouth daily. 09/27/14  Yes Brittainy Sherlynn CarbonM Simmons, PA-C  traMADol (ULTRAM) 50 MG tablet Take 100 mg by mouth every 6 (six) hours as needed for moderate pain or severe pain (pain). 06/20/14  Yes Karie SodaSteven Gross, MD  cefUROXime (CEFTIN) 500 MG tablet Take 1 tablet (500 mg total) by mouth 2 (two) times daily with a meal. Patient not taking: Reported on 04/15/2015 03/04/15   Clydia LlanoMutaz Elmahi, MD   BP 106/63 mmHg  Pulse 70  Temp(Src) 97.9 F (36.6 C) (Oral)  Resp 20  SpO2 96% Physical Exam  Constitutional: He is oriented to person, place, and time. He appears well-developed and well-nourished. No distress.  HENT:  Head: Normocephalic and atraumatic.  Mouth/Throat: No oropharyngeal exudate.  Eyes: Pupils are equal, round, and reactive to light.  Neck: Normal range of motion. Neck supple.  Cardiovascular: Normal rate, regular rhythm and normal heart sounds.  Exam reveals no gallop and no friction  rub.   No murmur heard. Pulmonary/Chest: Effort normal and breath sounds normal. No respiratory distress. He has no wheezes. He has no rales.  Abdominal: Soft. Bowel sounds are normal. He exhibits no distension and no mass. There is no tenderness. There is no rebound and no guarding.  Musculoskeletal: Normal range of motion. He exhibits no edema or tenderness.  Neurological: He is alert and oriented to person, place, and time. GCS eye subscore is 4. GCS verbal subscore is 4. GCS motor subscore is 6.  Motor agitation, pulling/grabbing at c-collar clothes, staff  Skin: Skin is warm and dry.  Psychiatric: He has a normal mood and affect.    ED Course  Procedures (including critical care time) Labs Review Labs Reviewed - No data to display  Imaging Review Ct Head Wo Contrast  04/15/2015   CLINICAL DATA:  Fall.  Initial encounter.  Dementia.  EXAM: CT HEAD WITHOUT CONTRAST  CT CERVICAL SPINE WITHOUT CONTRAST  TECHNIQUE: Multidetector CT imaging of the head and cervical spine was performed following the standard protocol without intravenous contrast. Multiplanar CT image reconstructions of the cervical spine were also generated.  COMPARISON:  03/01/2015 head CT  FINDINGS: CT HEAD FINDINGS  Skull and  Sinuses:No acute fracture. Postsurgical or posttraumatic thinning of the right frontal calvarium. Clear paranasal sinuses and mastoids.  Orbits: No acute abnormality.  Brain: No evidence of acute infarction, hemorrhage, hydrocephalus, or mass lesion/mass effect. Generalized atrophy with chronic ischemic gliosis in the bilateral cerebral white matter which is moderate. Stable gliosis in the right frontal cortex and subcortical white matter. Although this could reflect a remote infarct, neighboring calvarial deformity raises the possibility of postsurgical change or previous trauma.  CT CERVICAL SPINE FINDINGS  No evidence of acute fracture or traumatic malalignment. No gross cervical canal hematoma or  prevertebral edema. Multilevel facet arthropathy with overgrowth. Multilevel degenerative disc narrowing, greatest at C6-7 where there is ankylosis.  IMPRESSION: 1. No evidence of intracranial or cervical spine injury. 2. Brain atrophy, chronic small vessel disease, and chronic right frontal gliosis.   Electronically Signed   By: Marnee Spring M.D.   On: 04/15/2015 21:03   Ct Cervical Spine Wo Contrast  04/15/2015   CLINICAL DATA:  Fall.  Initial encounter.  Dementia.  EXAM: CT HEAD WITHOUT CONTRAST  CT CERVICAL SPINE WITHOUT CONTRAST  TECHNIQUE: Multidetector CT imaging of the head and cervical spine was performed following the standard protocol without intravenous contrast. Multiplanar CT image reconstructions of the cervical spine were also generated.  COMPARISON:  03/01/2015 head CT  FINDINGS: CT HEAD FINDINGS  Skull and Sinuses:No acute fracture. Postsurgical or posttraumatic thinning of the right frontal calvarium. Clear paranasal sinuses and mastoids.  Orbits: No acute abnormality.  Brain: No evidence of acute infarction, hemorrhage, hydrocephalus, or mass lesion/mass effect. Generalized atrophy with chronic ischemic gliosis in the bilateral cerebral white matter which is moderate. Stable gliosis in the right frontal cortex and subcortical white matter. Although this could reflect a remote infarct, neighboring calvarial deformity raises the possibility of postsurgical change or previous trauma.  CT CERVICAL SPINE FINDINGS  No evidence of acute fracture or traumatic malalignment. No gross cervical canal hematoma or prevertebral edema. Multilevel facet arthropathy with overgrowth. Multilevel degenerative disc narrowing, greatest at C6-7 where there is ankylosis.  IMPRESSION: 1. No evidence of intracranial or cervical spine injury. 2. Brain atrophy, chronic small vessel disease, and chronic right frontal gliosis.   Electronically Signed   By: Marnee Spring M.D.   On: 04/15/2015 21:03     EKG  Interpretation None      MDM   Final diagnoses:  Fall at nursing home, initial encounter  Dementia with behavioral disturbance    Pt is a 79 y.o. male with Pmhx as above who presents with report of witnessed fall at SNF, no LOC. No anticoagulant use. Pt agitated on exam, grabbing/pulling at medical equipment and staff. Pt will be given 5mg  IM haldol to facilitate neuro imaging.  He cannot reliably follow commands for neuro testing, although he is moving all extremities equally and has no signs of acute injury.    We were able to obtain a CT head and C-spine.  After 5 mg of IM Haldol.  There are no acute findings.  I believe patient can be discharged home to his facility.  [name] evaluation in the Emergency Department is complete. It has been determined that no acute conditions requiring further emergency intervention are present at this time. The patient/guardian have been advised of the diagnosis and plan. We have discussed signs and symptoms that warrant return to the ED, such as changes or worsening in symptoms, worsening pain, worsening mental status, numbness, weakness.    Toy Cookey, MD  04/15/15 2112 

## 2015-04-15 NOTE — ED Notes (Signed)
Pt confused, agitated and grabbing staff - having difficulty following commands. Pt c/o generalized pain all over. No obvious injuries.

## 2015-04-15 NOTE — ED Notes (Signed)
Patient transported to CT 

## 2015-04-15 NOTE — Discharge Instructions (Signed)
Fall Prevention and Home Safety °Falls cause injuries and can affect all age groups. It is possible to prevent falls.  °HOW TO PREVENT FALLS °· Wear shoes with rubber soles that do not have an opening for your toes. °· Keep the inside and outside of your house well lit. °· Use night lights throughout your home. °· Remove clutter from floors. °· Clean up floor spills. °· Remove throw rugs or fasten them to the floor with carpet tape. °· Do not place electrical cords across pathways. °· Put grab bars by your tub, shower, and toilet. Do not use towel bars as grab bars. °· Put handrails on both sides of the stairway. Fix loose handrails. °· Do not climb on stools or stepladders, if possible. °· Do not wax your floors. °· Repair uneven or unsafe sidewalks, walkways, or stairs. °· Keep items you use a lot within reach. °· Be aware of pets. °· Keep emergency numbers next to the telephone. °· Put smoke detectors in your home and near bedrooms. °Ask your doctor what other things you can do to prevent falls. °Document Released: 07/20/2009 Document Revised: 03/24/2012 Document Reviewed: 12/24/2011 °ExitCare® Patient Information ©2015 [employer]. This information is not intended to replace advice given to you by your health care provider. Make sure you discuss any questions you have with your health care provider. ° °

## 2015-04-15 NOTE — ED Notes (Signed)
[facility]. Per EMS, fall was witnessed and sts that pt was walking down the hall and fell. Pt hit back of head but had no LOC. Pt does not take blood thinners. Pt has hx of dementia and is unable to answer questions appropriately. EMS reports no obvious injury. Pt in NAD

## 2015-04-15 NOTE — ED Notes (Signed)
Bed: [id number] Expected date:  Expected time:  Means of arrival:  Comments: 47M fall, back of head vs floor/no loc/no anticoagulants

## 2015-06-15 ENCOUNTER — Ambulatory Visit: Payer: Self-pay | Admitting: Cardiovascular Disease

## 2015-07-31 ENCOUNTER — Emergency Department (HOSPITAL_COMMUNITY): Payer: Medicare Other

## 2015-07-31 ENCOUNTER — Emergency Department (HOSPITAL_COMMUNITY)
Admission: EM | Admit: 2015-07-31 | Discharge: 2015-07-31 | Disposition: A | Discharge status: Home / Self Care | Payer: Medicare Other | Attending: Emergency Medicine | Admitting: Emergency Medicine

## 2015-07-31 ENCOUNTER — Encounter (HOSPITAL_COMMUNITY): Payer: Self-pay | Admitting: Nurse Practitioner

## 2015-07-31 ENCOUNTER — Other Ambulatory Visit: Payer: Self-pay

## 2015-07-31 DIAGNOSIS — F0390 Unspecified dementia without behavioral disturbance: Secondary | ICD-10-CM | POA: Insufficient documentation

## 2015-07-31 DIAGNOSIS — Z8673 Personal history of transient ischemic attack (TIA), and cerebral infarction without residual deficits: Secondary | ICD-10-CM | POA: Diagnosis not present

## 2015-07-31 DIAGNOSIS — W19XXXA Unspecified fall, initial encounter: Secondary | ICD-10-CM

## 2015-07-31 DIAGNOSIS — Z7982 Long term (current) use of aspirin: Secondary | ICD-10-CM | POA: Insufficient documentation

## 2015-07-31 DIAGNOSIS — S0993XA Unspecified injury of face, initial encounter: Secondary | ICD-10-CM | POA: Diagnosis present

## 2015-07-31 DIAGNOSIS — Y9389 Activity, other specified: Secondary | ICD-10-CM | POA: Insufficient documentation

## 2015-07-31 DIAGNOSIS — Z79899 Other long term (current) drug therapy: Secondary | ICD-10-CM | POA: Diagnosis not present

## 2015-07-31 DIAGNOSIS — W1839XA Other fall on same level, initial encounter: Secondary | ICD-10-CM | POA: Insufficient documentation

## 2015-07-31 DIAGNOSIS — I10 Essential (primary) hypertension: Secondary | ICD-10-CM | POA: Insufficient documentation

## 2015-07-31 DIAGNOSIS — Y998 Other external cause status: Secondary | ICD-10-CM | POA: Diagnosis not present

## 2015-07-31 DIAGNOSIS — S0081XA Abrasion of other part of head, initial encounter: Secondary | ICD-10-CM

## 2015-07-31 DIAGNOSIS — Y92128 Other place in nursing home as the place of occurrence of the external cause: Secondary | ICD-10-CM | POA: Diagnosis not present

## 2015-07-31 LAB — URINALYSIS, ROUTINE W REFLEX MICROSCOPIC
Bilirubin Urine: NEGATIVE
GLUCOSE, UA: NEGATIVE mg/dL
Hgb urine dipstick: NEGATIVE
Ketones, ur: NEGATIVE mg/dL
LEUKOCYTES UA: NEGATIVE
Nitrite: NEGATIVE
Protein, ur: NEGATIVE mg/dL
SPECIFIC GRAVITY, URINE: 1.009 (ref 1.005–1.030)
Urobilinogen, UA: 0.2 mg/dL (ref 0.0–1.0)
pH: 8.5 — ABNORMAL HIGH (ref 5.0–8.0)

## 2015-07-31 NOTE — ED Provider Notes (Signed)
CSN: [id number]     Arrival date & time 07/31/15  0038 History   By signing my name below, I, Arlan Organ, attest that this documentation has been prepared under the direction and in the presence of Shon Baton, MD.  Electronically Signed: Arlan Organ, ED Scribe. 07/31/2015. 2:02 AM.   Chief Complaint  Patient presents with  . Fall   The history is provided by the patient. No language interpreter was used.    LEVEL 5 CAVEAT DUE TO DEMENTIA   HPI Comments: Jacob York brought in by EMS is a 79 y.o. male with a PMHx of HTN, dementia, and stroke who presents to the Emergency Department here after an unwitnessed fall sustained just prior to arrival. Pt was found in the hallway at [facility] this evening by staff.  Head trauma and LOC unknown. Repetitive speech and confusion reported per EMS which is not normal for him. No reported pain at this time. No recent fever or chills.  PCP: Ron Parker, MD   Past Medical History  Diagnosis Date  . Hypertension   . Stroke (HCC)   . Dementia   . Urinary retention 09/22/2014   Past Surgical History  Procedure Laterality Date  . Inguinal hernia repair N/A 06/20/2014    Procedure: LAPAROSCOPIC BILATERAL INGUINAL HERNIA REPAIR, LEFT FEMORAL AND INCARCERATED SUPRA UMBILICAL HERNIA REPAIR WITH MESH;  Surgeon: Karie Soda, MD;  Location: [facility];  Service: General;  Laterality: N/A;  . Insertion of mesh N/A 06/20/2014    Procedure: INSERTION OF MESH;  Surgeon: Karie Soda, MD;  Location: [facility];  Service: General;  Laterality: N/A;   Family History  Problem Relation Age of Onset  . Stroke Mother   . Hypertension Mother    Social History  Substance Use Topics  . Smoking status: Never Smoker   . Smokeless tobacco: Never Used  . Alcohol Use: No    Review of Systems  Unable to perform ROS: Dementia      Allergies  Review of patient's allergies indicates no known allergies.  Home Medications   Prior to Admission  medications   Medication Sig Start Date End Date Taking? Authorizing Provider  acetaminophen (TYLENOL) 500 MG tablet Take 500 mg by mouth every 4 (four) hours as needed for mild pain or moderate pain (pain).     Historical Provider, MD  alum & mag hydroxide-simeth (MAALOX/MYLANTA) 200-200-20 MG/5ML suspension Take 30 mLs by mouth every 6 (six) hours as needed for indigestion or heartburn.     Historical Provider, MD  aspirin 81 MG chewable tablet Chew 81 mg by mouth daily.    Historical Provider, MD  cefUROXime (CEFTIN) 500 MG tablet Take 1 tablet (500 mg total) by mouth 2 (two) times daily with a meal. Patient not taking: Reported on 04/15/2015 03/04/15   Clydia Llano, MD  cetirizine (ZYRTEC) 10 MG tablet Take 10 mg by mouth daily as needed (itching).    Historical Provider, MD  cholecalciferol (VITAMIN D) 1000 UNITS tablet Take 1,000 Units by mouth daily.    Historical Provider, MD  divalproex (DEPAKOTE SPRINKLE) 125 MG capsule Take 125 mg by mouth 2 (two) times daily.    Historical Provider, MD  guaifenesin (ROBITUSSIN) 100 MG/5ML syrup Take 200 mg by mouth 3 (three) times daily as needed for cough.    Historical Provider, MD  loperamide (IMODIUM) 2 MG capsule Take 2 mg by mouth as needed for diarrhea or loose stools.     Historical Provider, MD  magnesium  hydroxide (MILK OF MAGNESIA) 400 MG/5ML suspension Take 30 mLs by mouth at bedtime as needed for mild constipation.     Historical Provider, MD  mirtazapine (REMERON) 15 MG tablet Take 15 mg by mouth at bedtime.    Historical Provider, MD  neomycin-bacitracin-polymyxin (NEOSPORIN) [phone] ointment Apply 1 application topically daily as needed (for skin tears,abrasions,minor irritation).     Historical Provider, MD  Nutritional Supplements (GLUCERNA SHAKE PO) Take 1 each by mouth 3 (three) times daily. Mighty Shakes    Historical Provider, MD  Skin Protectants, Misc. (MINERIN) CREA Apply 1 application topically daily. To entire body    Historical  Provider, MD  tamsulosin (FLOMAX) 0.4 MG CAPS capsule Take 1 capsule (0.4 mg total) by mouth daily. 09/27/14   Brittainy Sherlynn Carbon, PA-C  traMADol (ULTRAM) 50 MG tablet Take 100 mg by mouth every 6 (six) hours as needed for moderate pain or severe pain (pain). 06/20/14   Karie Soda, MD   Triage Vitals: BP 142/96 mmHg  Pulse 86  Temp(Src) 98.6 F (37 C) (Oral)  Resp 18  SpO2 100%   Physical Exam  Constitutional:  ABCs intact, patient perseverating  HENT:  Head: Normocephalic.  Superficial abrasion noted over the forehead and bridge of the nose  Eyes: EOM are normal. Pupils are equal, round, and reactive to light.  Neck:  C-collar in place  Cardiovascular: Normal rate, regular rhythm and normal heart sounds.   No murmur heard. Pulmonary/Chest: Effort normal and breath sounds normal. No respiratory distress. He has no wheezes.  Abdominal: Soft. Bowel sounds are normal. There is no tenderness. There is no rebound.  Musculoskeletal: He exhibits no edema.  No obvious deformities, normal range of motion  Neurological: He is alert.  Disoriented, follows simple commands, moves all 4 extremities  Skin: Skin is warm and dry.  Psychiatric: He has a normal mood and affect.  Nursing note and vitals reviewed.   ED Course  Procedures (including critical care time)  DIAGNOSTIC STUDIES: Oxygen Saturation is 100% on RA, Normal by my interpretation.    COORDINATION OF CARE: 12:44 AM- Will order CT head without contrast, CT cervical spine without contrast, and urinalysis. Discussed treatment plan with pt at bedside and pt agreed to plan.     Labs Review Labs Reviewed  URINALYSIS, ROUTINE W REFLEX MICROSCOPIC (NOT AT [facility]) - Abnormal; Notable for the following:    pH 8.5 (*)    All other components within normal limits    Imaging Review Ct Head Wo Contrast  07/31/2015  CLINICAL DATA:  Found down in hall at nursing home, unknown head injury or loss of consciousness. History of dementia,  stroke, hypertension. EXAM: CT HEAD WITHOUT CONTRAST CT CERVICAL SPINE WITHOUT CONTRAST TECHNIQUE: Multidetector CT imaging of the head and cervical spine was performed following the standard protocol without intravenous contrast. Multiplanar CT image reconstructions of the cervical spine were also generated. COMPARISON:  CT head and cervical spine April 15, 2015 FINDINGS: CT HEAD FINDINGS Mildly motion degraded examination. The ventricles and sulci are proportionally mildly prominent for age, unchanged. No intraparenchymal hemorrhage, mass effect nor midline shift. Patchy supratentorial white matter hypodensities are within normal range for patient's age and though non-specific suggest sequelae of chronic small vessel ischemic disease. No acute large vascular territory infarcts. RIGHT frontal lobe encephalomalacia. No abnormal extra-axial fluid collections. Basal cisterns are patent. Mild calcific atherosclerosis of the carotid siphons. No skull fracture. The included ocular globes and orbital contents are non-suspicious. The mastoid aircells and  included paranasal sinuses are well-aerated. CT CERVICAL SPINE FINDINGS Mild motion degraded examination. Cervical vertebral bodies and posterior elements intact. Grade 1 C4-5 and C5-6 anterolisthesis is unchanged. C6-7 auto interbody arthrodesis. Moderate to severe C3-4 disc height loss, uncovertebral hypertrophy resulting in severe neural foraminal narrowing. Moderate to severe neural foraminal narrowing C4-5 through C6-7. Moderate to severe multilevel facet arthropathy. Severe atlantodental osteoarthrosis. No destructive bony lesions. Moderate calcific atherosclerosis of the carotid bulbs. IMPRESSION: CT HEAD: No acute intracranial process. Mild global parenchymal brain volume loss for age. RIGHT frontal encephalomalacia may be posttraumatic or ischemic. CT CERVICAL SPINE: No acute fracture. Stable grade 1 C4-5 and C5-6 anterolisthesis on degenerative basis.  Electronically Signed   By: Awilda Metroourtnay  Bloomer M.D.   On: 07/31/2015 01:51   Ct Cervical Spine Wo Contrast  07/31/2015  CLINICAL DATA:  Found down in hall at nursing home, unknown head injury or loss of consciousness. History of dementia, stroke, hypertension. EXAM: CT HEAD WITHOUT CONTRAST CT CERVICAL SPINE WITHOUT CONTRAST TECHNIQUE: Multidetector CT imaging of the head and cervical spine was performed following the standard protocol without intravenous contrast. Multiplanar CT image reconstructions of the cervical spine were also generated. COMPARISON:  CT head and cervical spine April 15, 2015 FINDINGS: CT HEAD FINDINGS Mildly motion degraded examination. The ventricles and sulci are proportionally mildly prominent for age, unchanged. No intraparenchymal hemorrhage, mass effect nor midline shift. Patchy supratentorial white matter hypodensities are within normal range for patient's age and though non-specific suggest sequelae of chronic small vessel ischemic disease. No acute large vascular territory infarcts. RIGHT frontal lobe encephalomalacia. No abnormal extra-axial fluid collections. Basal cisterns are patent. Mild calcific atherosclerosis of the carotid siphons. No skull fracture. The included ocular globes and orbital contents are non-suspicious. The mastoid aircells and included paranasal sinuses are well-aerated. CT CERVICAL SPINE FINDINGS Mild motion degraded examination. Cervical vertebral bodies and posterior elements intact. Grade 1 C4-5 and C5-6 anterolisthesis is unchanged. C6-7 auto interbody arthrodesis. Moderate to severe C3-4 disc height loss, uncovertebral hypertrophy resulting in severe neural foraminal narrowing. Moderate to severe neural foraminal narrowing C4-5 through C6-7. Moderate to severe multilevel facet arthropathy. Severe atlantodental osteoarthrosis. No destructive bony lesions. Moderate calcific atherosclerosis of the carotid bulbs. IMPRESSION: CT HEAD: No acute intracranial  process. Mild global parenchymal brain volume loss for age. RIGHT frontal encephalomalacia may be posttraumatic or ischemic. CT CERVICAL SPINE: No acute fracture. Stable grade 1 C4-5 and C5-6 anterolisthesis on degenerative basis. Electronically Signed   By: Awilda Metroourtnay  Bloomer M.D.   On: 07/31/2015 01:51   I have personally reviewed and evaluated these images and lab results as part of my medical decision-making.   EKG Interpretation ED ECG REPORT   Date: 07/31/2015  Rate: 83  Rhythm: normal sinus rhythm  QRS Axis: normal  Intervals: PR prolonged  ST/T Wave abnormalities: normal  Conduction Disutrbances:none  Narrative Interpretation:   Old EKG Reviewed: unchanged  I have personally reviewed the EKG tracing and agree with the computerized printout as noted.       MDM   Final diagnoses:  Fall, initial encounter  Facial abrasion, initial encounter   Patient presents following a fall. Noncontributory to history taking. Per report, at his baseline. History of dementia. Only evidence of injury is a superficial abrasion to the face.  CT head and neck obtained and reassuring. Urinalysis without infection. EKG without significant arrhythmia.  After history, exam, and medical workup I feel the patient has been appropriately medically screened and is safe for discharge home.  Pertinent diagnoses were discussed with the patient. Patient was given return precautions.   I personally performed the services described in this documentation, which was scribed in my presence. The recorded information has been reviewed and is accurate.   Shon Baton, MD 07/31/15 [phone]

## 2015-07-31 NOTE — ED Notes (Signed)
Bed: WA09 Expected date:  Expected time:  Means of arrival:  Comments: EMS 23M Fall/[facility]/FA abrasion

## 2015-07-31 NOTE — ED Notes (Signed)
[facility] No LOC per hand off report from medics.

## 2015-07-31 NOTE — Discharge Instructions (Signed)
Abrasion An abrasion is a cut or scrape on the outer surface of your skin. An abrasion does not extend through all of the layers of your skin. It is important to care for your abrasion properly to prevent infection. CAUSES Most abrasions are caused by falling on or gliding across the ground or another surface. When your skin rubs on something, the outer and inner layer of skin rubs off.  SYMPTOMS A cut or scrape is the main symptom of this condition. The scrape may be bleeding, or it may appear red or pink. If there was an associated fall, there may be an underlying bruise. DIAGNOSIS An abrasion is diagnosed with a physical exam. TREATMENT Treatment for this condition depends on how large and deep the abrasion is. Usually, your abrasion will be cleaned with water and mild soap. This removes any dirt or debris that may be stuck. An antibiotic ointment may be applied to the abrasion to help prevent infection. A bandage (dressing) may be placed on the abrasion to keep it clean. You may also need a tetanus shot. HOME CARE INSTRUCTIONS Medicines  Take or apply medicines only as directed by your health care provider.  If you were prescribed an antibiotic ointment, finish all of it even if you start to feel better. Wound Care  Clean the wound with mild soap and water 2-3 times per day or as directed by your health care provider. Pat your wound dry with a clean towel. Do not rub it.  There are many different ways to close and cover a wound. Follow instructions from your health care provider about:  Wound care.  Dressing changes and removal.  Check your wound every day for signs of infection. Watch for:  Redness, swelling, or pain.  Fluid, blood, or pus. General Instructions  Keep the dressing dry as directed by your health care provider. Do not take baths, swim, use a hot tub, or do anything that would put your wound underwater until your health care provider approves.  If there is  swelling, raise (elevate) the injured area above the level of your heart while you are sitting or lying down.  Keep all follow-up visits as directed by your health care provider. This is important. SEEK MEDICAL CARE IF:  You received a tetanus shot and you have swelling, severe pain, redness, or bleeding at the injection site.  Your pain is not controlled with medicine.  You have increased redness, swelling, or pain at the site of your wound. SEEK IMMEDIATE MEDICAL CARE IF:  You have a red streak going away from your wound.  You have a fever.  You have fluid, blood, or pus coming from your wound.  You notice a bad smell coming from your wound or your dressing.   This information is not intended to replace advice given to you by your health care provider. Make sure you discuss any questions you have with your health care provider.   Document Released: 07/03/2005 Document Revised: 06/14/2015 Document Reviewed: 09/21/2014 Elsevier Interactive Patient Education 2016 [employer]  

## 2015-09-20 ENCOUNTER — Emergency Department (HOSPITAL_COMMUNITY): Payer: Medicare Other

## 2015-09-20 ENCOUNTER — Encounter (HOSPITAL_COMMUNITY): Payer: Self-pay | Admitting: Emergency Medicine

## 2015-09-20 ENCOUNTER — Emergency Department (HOSPITAL_COMMUNITY)
Admission: EM | Admit: 2015-09-20 | Discharge: 2015-09-21 | Disposition: A | Discharge status: Home / Self Care | Payer: Medicare Other | Attending: Emergency Medicine | Admitting: Emergency Medicine

## 2015-09-20 DIAGNOSIS — I10 Essential (primary) hypertension: Secondary | ICD-10-CM | POA: Diagnosis not present

## 2015-09-20 DIAGNOSIS — F0390 Unspecified dementia without behavioral disturbance: Secondary | ICD-10-CM | POA: Insufficient documentation

## 2015-09-20 DIAGNOSIS — Z79899 Other long term (current) drug therapy: Secondary | ICD-10-CM | POA: Diagnosis not present

## 2015-09-20 DIAGNOSIS — F409 Phobic anxiety disorder, unspecified: Secondary | ICD-10-CM | POA: Insufficient documentation

## 2015-09-20 DIAGNOSIS — Z8673 Personal history of transient ischemic attack (TIA), and cerebral infarction without residual deficits: Secondary | ICD-10-CM | POA: Insufficient documentation

## 2015-09-20 DIAGNOSIS — I712 Thoracic aortic aneurysm, without rupture: Secondary | ICD-10-CM | POA: Insufficient documentation

## 2015-09-20 DIAGNOSIS — R0602 Shortness of breath: Secondary | ICD-10-CM | POA: Diagnosis present

## 2015-09-20 DIAGNOSIS — Z7982 Long term (current) use of aspirin: Secondary | ICD-10-CM | POA: Diagnosis not present

## 2015-09-20 DIAGNOSIS — I7121 Aneurysm of the ascending aorta, without rupture: Secondary | ICD-10-CM

## 2015-09-20 LAB — COMPREHENSIVE METABOLIC PANEL
ALK PHOS: 60 U/L (ref 38–126)
ALT: 13 U/L — ABNORMAL LOW (ref 17–63)
AST: 18 U/L (ref 15–41)
Albumin: 3.9 g/dL (ref 3.5–5.0)
Anion gap: 11 (ref 5–15)
BUN: 27 mg/dL — ABNORMAL HIGH (ref 6–20)
CALCIUM: 9.4 mg/dL (ref 8.9–10.3)
CO2: 22 mmol/L (ref 22–32)
CREATININE: 1.24 mg/dL (ref 0.61–1.24)
Chloride: 111 mmol/L (ref 101–111)
GFR, EST NON AFRICAN AMERICAN: 54 mL/min — AB (ref >60)
Glucose, Bld: 94 mg/dL (ref 65–99)
Potassium: 3 mmol/L — ABNORMAL LOW (ref 3.5–5.1)
SODIUM: 144 mmol/L (ref 135–145)
Total Bilirubin: 0.8 mg/dL (ref 0.3–1.2)
Total Protein: 6.3 g/dL — ABNORMAL LOW (ref 6.5–8.1)

## 2015-09-20 LAB — CBC WITH DIFFERENTIAL/PLATELET
Basophils Absolute: 0 10*3/uL (ref 0.0–0.1)
Basophils Relative: 0 %
EOS ABS: 0.1 10*3/uL (ref 0.0–0.7)
Eosinophils Relative: 2 %
HEMATOCRIT: 42.7 % (ref 39.0–52.0)
HEMOGLOBIN: 14.1 g/dL (ref 13.0–17.0)
Lymphocytes Relative: 27 %
Lymphs Abs: 2 10*3/uL (ref 0.7–4.0)
MCH: 30 pg (ref 26.0–34.0)
MCHC: 33 g/dL (ref 30.0–36.0)
MCV: 90.9 fL (ref 78.0–100.0)
MONOS PCT: 7 %
Monocytes Absolute: 0.5 10*3/uL (ref 0.1–1.0)
NEUTROS PCT: 64 %
Neutro Abs: 4.7 10*3/uL (ref 1.7–7.7)
Platelets: 177 10*3/uL (ref 150–400)
RBC: 4.7 MIL/uL (ref 4.22–5.81)
RDW: 13.1 % (ref 11.5–15.5)
WBC: 7.3 10*3/uL (ref 4.0–10.5)

## 2015-09-20 LAB — I-STAT TROPONIN, ED: Troponin i, poc: 0 ng/mL (ref 0.00–0.08)

## 2015-09-20 LAB — BRAIN NATRIURETIC PEPTIDE: B NATRIURETIC PEPTIDE 5: 105.3 pg/mL — AB (ref 0.0–100.0)

## 2015-09-20 MED ORDER — POTASSIUM CHLORIDE 10 MEQ/100ML IV SOLN
10.0000 meq | Freq: Once | INTRAVENOUS | Status: AC
  Administered 2015-09-20: 10 meq via INTRAVENOUS
  Filled 2015-09-20: qty 100

## 2015-09-20 MED ORDER — IOHEXOL 350 MG/ML SOLN
100.0000 mL | Freq: Once | INTRAVENOUS | Status: AC | PRN
  Administered 2015-09-20: 100 mL via INTRAVENOUS

## 2015-09-20 MED ORDER — POTASSIUM CHLORIDE 20 MEQ/15ML (10%) PO SOLN
40.0000 meq | Freq: Once | ORAL | Status: AC
  Administered 2015-09-20: 40 meq via ORAL
  Filled 2015-09-20: qty 30

## 2015-09-20 NOTE — ED Notes (Signed)
Bed: [id number] Expected date:  Expected time:  Means of arrival:  Comments: EMS 79 yo male from SNF/[facility]-hx dementia, 97% RA

## 2015-09-20 NOTE — ED Notes (Addendum)
[facility], per staff pt had hyperventilation and sob all the sudden. No known Hx of anxiety. Denies cough nor fever, per ems lungs are clear. Hx dementia and oriented to self only. denies chest pain , unremarkable EKG. Per staff per ems pt is normally ambulatory without assistance.

## 2015-09-20 NOTE — ED Provider Notes (Signed)
CSN: [id number]     Arrival date & time 09/20/15  2033 History   First MD Initiated Contact with Patient 09/20/15 2040     Chief Complaint  Patient presents with  . Shortness of Breath     (Consider location/radiation/quality/duration/timing/severity/associated sxs/prior Treatment) HPI Comments: Shortness of breath, not sure when it started Per stasff at facility was SOB and hyperventilation all of a sudden No cough or fever, denies chest pain Patient repeating "I don't know, I'm just scared to death" Unable to report more specific details of shortness of breath Patient hx of dementia and reviewing notes this appears to be baseline   Patient is a 79 y.o. male presenting with shortness of breath. History limited by: dementia.  Shortness of Breath Associated symptoms: no abdominal pain, no chest pain, no fever, no headaches, no rash and no sore throat     Past Medical History  Diagnosis Date  . Hypertension   . Stroke (HCC)   . Dementia   . Urinary retention 09/22/2014   Past Surgical History  Procedure Laterality Date  . Inguinal hernia repair N/A 06/20/2014    Procedure: LAPAROSCOPIC BILATERAL INGUINAL HERNIA REPAIR, LEFT FEMORAL AND INCARCERATED SUPRA UMBILICAL HERNIA REPAIR WITH MESH;  Surgeon: Karie Soda, MD;  Location: [facility];  Service: General;  Laterality: N/A;  . Insertion of mesh N/A 06/20/2014    Procedure: INSERTION OF MESH;  Surgeon: Karie Soda, MD;  Location: [facility];  Service: General;  Laterality: N/A;   Family History  Problem Relation Age of Onset  . Stroke Mother   . Hypertension Mother    Social History  Substance Use Topics  . Smoking status: Never Smoker   . Smokeless tobacco: Never Used  . Alcohol Use: No    Review of Systems  Constitutional: Negative for fever.  HENT: Negative for sore throat.   Eyes: Negative for visual disturbance.  Respiratory: Positive for shortness of breath.   Cardiovascular: Negative for chest pain.   Gastrointestinal: Negative for abdominal pain.  Genitourinary: Negative for difficulty urinating.  Musculoskeletal: Negative for back pain and neck stiffness.  Skin: Negative for rash.  Neurological: Negative for syncope and headaches.      Allergies  Review of patient's allergies indicates no known allergies.  Home Medications   Prior to Admission medications   Medication Sig Start Date End Date Taking? Authorizing Provider  aspirin 81 MG chewable tablet Chew 81 mg by mouth daily.   Yes Historical Provider, MD  cholecalciferol (VITAMIN D) 1000 UNITS tablet Take 1,000 Units by mouth daily.   Yes Historical Provider, MD  divalproex (DEPAKOTE SPRINKLE) 125 MG capsule Take 250 mg by mouth 2 (two) times daily.    Yes Historical Provider, MD  LORazepam (ATIVAN) 0.5 MG tablet Take 0.5 mg by mouth every 8 (eight) hours as needed for anxiety (agitation).   Yes Historical Provider, MD  memantine (NAMENDA XR) 7 MG CP24 24 hr capsule Take 7 mg by mouth daily.   Yes Historical Provider, MD  mirtazapine (REMERON) 15 MG tablet Take 15 mg by mouth at bedtime.   Yes Historical Provider, MD  Nutritional Supplements (GLUCERNA SHAKE PO) Take 1 each by mouth 3 (three) times daily. Mighty Shakes   Yes Historical Provider, MD  Skin Protectants, Misc. (MINERIN) CREA Apply 1 application topically 2 (two) times daily. To entire body   Yes Historical Provider, MD  tamsulosin (FLOMAX) 0.4 MG CAPS capsule Take 1 capsule (0.4 mg total) by mouth daily. 09/27/14  Yes Brittainy  Sherlynn Carbon, PA-C  acetaminophen (TYLENOL) 500 MG tablet Take 500 mg by mouth every 4 (four) hours as needed for mild pain or moderate pain (pain).     Historical Provider, MD  alum & mag hydroxide-simeth (MAALOX/MYLANTA) 200-200-20 MG/5ML suspension Take 30 mLs by mouth every 6 (six) hours as needed for indigestion or heartburn.     Historical Provider, MD  cefUROXime (CEFTIN) 500 MG tablet Take 1 tablet (500 mg total) by mouth 2 (two) times  daily with a meal. Patient not taking: Reported on 04/15/2015 03/04/15   Clydia Llano, MD  cetirizine (ZYRTEC) 10 MG tablet Take 10 mg by mouth daily as needed (itching).    Historical Provider, MD  guaifenesin (ROBITUSSIN) 100 MG/5ML syrup Take 200 mg by mouth 3 (three) times daily as needed for cough.    Historical Provider, MD  loperamide (IMODIUM) 2 MG capsule Take 2 mg by mouth as needed for diarrhea or loose stools.     Historical Provider, MD  magnesium hydroxide (MILK OF MAGNESIA) 400 MG/5ML suspension Take 30 mLs by mouth at bedtime as needed for mild constipation.     Historical Provider, MD  neomycin-bacitracin-polymyxin (NEOSPORIN) [phone] ointment Apply 1 application topically daily as needed (for skin tears,abrasions,minor irritation).     Historical Provider, MD  traMADol (ULTRAM) 50 MG tablet Take 100 mg by mouth every 6 (six) hours as needed for moderate pain or severe pain (pain). 06/20/14   Karie Soda, MD   BP 159/81 mmHg  Pulse 64  Temp(Src) 97.9 F (36.6 C) (Oral)  Resp 18  SpO2 100% Physical Exam  Constitutional: He is oriented to person, place, and time. He appears well-developed and well-nourished. No distress.  HENT:  Head: Normocephalic and atraumatic.  Eyes: Conjunctivae and EOM are normal.  Neck: Normal range of motion.  Cardiovascular: Normal rate, regular rhythm, normal heart sounds and intact distal pulses.  Exam reveals no gallop and no friction rub.   No murmur heard. Pulmonary/Chest: Effort normal and breath sounds normal. No respiratory distress. He has no wheezes. He has no rales.  Abdominal: Soft. He exhibits no distension. There is no tenderness. There is no guarding.  Musculoskeletal: He exhibits no edema.  Neurological: He is alert and oriented to person, place, and time.  Skin: Skin is warm and dry. He is not diaphoretic.  Nursing note and vitals reviewed.   ED Course  Procedures (including critical care time) Labs Review Labs Reviewed   COMPREHENSIVE METABOLIC PANEL - Abnormal; Notable for the following:    Potassium 3.0 (*)    BUN 27 (*)    Total Protein 6.3 (*)    ALT 13 (*)    GFR calc non Af Amer 54 (*)    All other components within normal limits  BRAIN NATRIURETIC PEPTIDE - Abnormal; Notable for the following:    B Natriuretic Peptide 105.3 (*)    All other components within normal limits  CBC WITH DIFFERENTIAL/PLATELET  I-STAT TROPOININ, ED  I-STAT TROPOININ, ED  Rosezena Sensor, ED    Imaging Review Dg Chest 2 View  09/20/2015  CLINICAL DATA:  Shortness of breath. EXAM: CHEST  2 VIEW COMPARISON:  Mar 01, 2015 FINDINGS: No edema or consolidation. The heart size and pulmonary vascularity are normal. No adenopathy. There is degenerative change in the thoracic spine. IMPRESSION: No edema or consolidation. Electronically Signed   By: Bretta Bang III M.D.   On: 09/20/2015 21:51   Ct Angio Chest Pe W/cm &/or Wo Cm  09/20/2015  CLINICAL DATA:  Acute onset of shortness of breath. Hyperventilation. Initial encounter. EXAM: CT ANGIOGRAPHY CHEST WITH CONTRAST TECHNIQUE: Multidetector CT imaging of the chest was performed using the standard protocol during bolus administration of intravenous contrast. Multiplanar CT image reconstructions and MIPs were obtained to evaluate the vascular anatomy. CONTRAST:  OMNIPAQUE IOHEXOL 350 MG/ML SOLN COMPARISON:  Chest radiograph performed earlier today at 9:41 p.m. FINDINGS: There is no evidence of pulmonary embolus. The lungs are essentially clear bilaterally. There is mild eventration of fat at the posterior right hemidiaphragm. There is no evidence of significant focal consolidation, pleural effusion or pneumothorax. No masses are identified; no abnormal focal contrast enhancement is seen. There is mild aneurysmal dilatation of the ascending thoracic aorta to 4.2 cm in maximal diameter. Mild mural thrombus is noted along the distal aortic arch. No pericardial effusion is  identified. The great vessels are grossly unremarkable in appearance. No mediastinal lymphadenopathy is seen. No axillary lymphadenopathy is seen. The thyroid gland is unremarkable in appearance. Vague hypodensities within the liver measure up to 1.4 cm in size. These are nonspecific but may reflect small cysts. The visualized portions of the spleen are unremarkable. The visualized portions of the pancreas and adrenal glands are within normal limits. No acute osseous abnormalities are seen. Review of the MIP images confirms the above findings. IMPRESSION: 1. No evidence of pulmonary embolus. 2. Lungs clear bilaterally. 3. Mild aneurysmal dilatation of the ascending thoracic aorta to 4.2 cm in maximal diameter. Recommend annual imaging followup by CTA or MRA. This recommendation follows 2010 ACCF/AHA/AATS/ACR/ASA/SCA/SCAI/SIR/STS/SVM Guidelines for the Diagnosis and Management of Patients with Thoracic Aortic Disease. Circulation. 2010; 121: Z610-R604. 4. Vague hypodensities within the liver measure up to 1.4 cm in size. These are nonspecific but may reflect small cysts. Electronically Signed   By: Roanna Raider M.D.   On: 09/20/2015 23:31   I have personally reviewed and evaluated these images and lab results as part of my medical decision-making.   EKG Interpretation   Date/Time:  Wednesday September 20 2015 20:39:07 EST Ventricular Rate:  73 PR Interval:  238 QRS Duration: 89 QT Interval:  421 QTC Calculation: 464 R Axis:   63 Text Interpretation:  Sinus rhythm Ventricular premature complex Prolonged  PR interval Anteroseptal infarct, age indeterminate No significant change  since last tracing (PVC new from prior) Confirmed by [facility] MD, Jaeleigh Monaco  ([id number]) on 09/20/2015 8:56:12 PM      MDM   Final diagnoses:  Scared  Shortness of breath  Ascending aortic aneurysm (HCC)   79 year old male with a history of dementia, hypertension, stroke presents with concern for shortness of breath.  On  arrival to the emergency department, patient is in normal sinus rhythm, with mild tachypnea, however normal oxygenation on room air. EKG was invited by me and showed a sinus rhythm with PVCs. Patient was monitored on telemetry with no sign of arrhythmia other than PVCs.  Chest x-ray shows no sign of pneumonia.  Given unable to clear history from patient, CT PE study was ordered which showed no evidence of bony embolus and clear lungs bilaterally. There is mild aneurysmal dilation of his thoracic aorta with instructions for follow-up and was adjusted discharge instructions. No sign of anemia. Mild hypokalemia for which patient was given by mouth and IV potassium.  Delta troponins are both negative. Given this evaluation of low suspicion for ACS, PE, pneumonia, pneumothorax, acute CHF exacerbation, anemia, or COPD exacerbation as cause of this episode. On reevaluation patient  he reports he is "on cloud 9" and does not have any tachypnea or signs of respiratory distress. He appears back to baseline.  Patient stable for transfer back to facility.    Alvira MondayErin Jaeven Wanzer, MD 09/21/15 [phone]

## 2015-09-20 NOTE — ED Notes (Signed)
Pt getting increasingly agitated

## 2015-09-21 DIAGNOSIS — I712 Thoracic aortic aneurysm, without rupture: Secondary | ICD-10-CM | POA: Diagnosis not present

## 2015-09-21 LAB — I-STAT TROPONIN, ED: Troponin i, poc: 0 ng/mL (ref 0.00–0.08)

## 2015-09-21 NOTE — ED Notes (Signed)
Pt called for Pt transport back to facility.

## 2015-10-12 ENCOUNTER — Emergency Department (HOSPITAL_COMMUNITY)
Admission: EM | Admit: 2015-10-12 | Discharge: 2015-10-12 | Disposition: A | Discharge status: Home / Self Care | Payer: Medicare Other | Attending: Emergency Medicine | Admitting: Emergency Medicine

## 2015-10-12 ENCOUNTER — Emergency Department (HOSPITAL_COMMUNITY): Payer: Medicare Other

## 2015-10-12 ENCOUNTER — Encounter (HOSPITAL_COMMUNITY): Payer: Self-pay | Admitting: *Deleted

## 2015-10-12 DIAGNOSIS — F0390 Unspecified dementia without behavioral disturbance: Secondary | ICD-10-CM | POA: Diagnosis not present

## 2015-10-12 DIAGNOSIS — Z8673 Personal history of transient ischemic attack (TIA), and cerebral infarction without residual deficits: Secondary | ICD-10-CM | POA: Diagnosis not present

## 2015-10-12 DIAGNOSIS — I10 Essential (primary) hypertension: Secondary | ICD-10-CM | POA: Insufficient documentation

## 2015-10-12 DIAGNOSIS — Z79899 Other long term (current) drug therapy: Secondary | ICD-10-CM | POA: Diagnosis not present

## 2015-10-12 DIAGNOSIS — R410 Disorientation, unspecified: Secondary | ICD-10-CM

## 2015-10-12 DIAGNOSIS — Z7982 Long term (current) use of aspirin: Secondary | ICD-10-CM | POA: Diagnosis not present

## 2015-10-12 DIAGNOSIS — R4182 Altered mental status, unspecified: Secondary | ICD-10-CM | POA: Diagnosis present

## 2015-10-12 LAB — CBC
HCT: 42 % (ref 39.0–52.0)
Hemoglobin: 13.9 g/dL (ref 13.0–17.0)
MCH: 30.6 pg (ref 26.0–34.0)
MCHC: 33.1 g/dL (ref 30.0–36.0)
MCV: 92.5 fL (ref 78.0–100.0)
PLATELETS: 165 10*3/uL (ref 150–400)
RBC: 4.54 MIL/uL (ref 4.22–5.81)
RDW: 13.5 % (ref 11.5–15.5)
WBC: 10 10*3/uL (ref 4.0–10.5)

## 2015-10-12 LAB — BASIC METABOLIC PANEL
Anion gap: 12 (ref 5–15)
BUN: 23 mg/dL — AB (ref 6–20)
CALCIUM: 9.2 mg/dL (ref 8.9–10.3)
CO2: 21 mmol/L — ABNORMAL LOW (ref 22–32)
CREATININE: 1.18 mg/dL (ref 0.61–1.24)
Chloride: 110 mmol/L (ref 101–111)
GFR calc Af Amer: >60 mL/min (ref >60)
GFR, EST NON AFRICAN AMERICAN: 58 mL/min — AB (ref >60)
GLUCOSE: 85 mg/dL (ref 65–99)
Potassium: 3.3 mmol/L — ABNORMAL LOW (ref 3.5–5.1)
SODIUM: 143 mmol/L (ref 135–145)

## 2015-10-12 NOTE — ED Notes (Addendum)
[facility].   Per staff, pt woke up this am and was having trouble seeing and becoming combative with staff which is not normal for him.   After his vision recovered, he was put on the couch and repeating phrases and more confused than normal per staff.  VSS by ems. CBG was 80, pt ate breakfast.

## 2015-10-12 NOTE — ED Notes (Signed)
Attempted to call facility for reports, the care giver requested urinalysis to rule out UTI due to pt's altred mental status although pt was evaluated for stroke like symptoms. Dr. Patria Maneampos made aware and UA has been ordered. Pt is unable to provide urine sample while waiting for [facility] transport. Per Dr Patria Maneampos pt will be discharged with instructions to have UA done at the facility if unable to obtain urine sample .

## 2015-10-12 NOTE — ED Provider Notes (Signed)
CSN: [id number]     Arrival date & time 10/12/15  [id number] History   First MD Initiated Contact with Patient 10/12/15 0930     Chief Complaint  Patient presents with  . Altered Mental Status    Level V caveat: Dementia  HPI As reported that earlier today the patient was having some difficulty with his vision became somewhat combative with staff which is an atypical presentation for him.  Staff then reports that he was somewhat repetitive on the couch and more confused.  Patient is now normalized is without complaints.  He is alert.  He knows he is at the hospital.  He is calm and cooperative at this time and moves all 4 extremities equally.  He reports his vision is normal.  He does have dementia and therefore is not able to provide any history regarding his symptoms earlier today.   Past Medical History  Diagnosis Date  . Hypertension   . Stroke (HCC)   . Dementia   . Urinary retention 09/22/2014   Past Surgical History  Procedure Laterality Date  . Inguinal hernia repair N/A 06/20/2014    Procedure: LAPAROSCOPIC BILATERAL INGUINAL HERNIA REPAIR, LEFT FEMORAL AND INCARCERATED SUPRA UMBILICAL HERNIA REPAIR WITH MESH;  Surgeon: Karie SodaSteven Gross, MD;  Location: [facility];  Service: General;  Laterality: N/A;  . Insertion of mesh N/A 06/20/2014    Procedure: INSERTION OF MESH;  Surgeon: Karie SodaSteven Gross, MD;  Location: [facility];  Service: General;  Laterality: N/A;   Family History  Problem Relation Age of Onset  . Stroke Mother   . Hypertension Mother    Social History  Substance Use Topics  . Smoking status: Never Smoker   . Smokeless tobacco: Never Used  . Alcohol Use: No    Review of Systems  Unable to perform ROS: Dementia      Allergies  Review of patient's allergies indicates no known allergies.  Home Medications   Prior to Admission medications   Medication Sig Start Date End Date Taking? Authorizing Provider  acetaminophen (TYLENOL) 500 MG tablet Take 500 mg by mouth every 4  (four) hours as needed for mild pain or moderate pain (pain).     Historical Provider, MD  alum & mag hydroxide-simeth (MAALOX/MYLANTA) 200-200-20 MG/5ML suspension Take 30 mLs by mouth every 6 (six) hours as needed for indigestion or heartburn.     Historical Provider, MD  aspirin 81 MG chewable tablet Chew 81 mg by mouth daily.    Historical Provider, MD  cefUROXime (CEFTIN) 500 MG tablet Take 1 tablet (500 mg total) by mouth 2 (two) times daily with a meal. Patient not taking: Reported on 04/15/2015 03/04/15   Clydia LlanoMutaz Elmahi, MD  cetirizine (ZYRTEC) 10 MG tablet Take 10 mg by mouth daily as needed (itching).    Historical Provider, MD  cholecalciferol (VITAMIN D) 1000 UNITS tablet Take 1,000 Units by mouth daily.    Historical Provider, MD  divalproex (DEPAKOTE SPRINKLE) 125 MG capsule Take 250 mg by mouth 2 (two) times daily.     Historical Provider, MD  guaifenesin (ROBITUSSIN) 100 MG/5ML syrup Take 200 mg by mouth 3 (three) times daily as needed for cough.    Historical Provider, MD  loperamide (IMODIUM) 2 MG capsule Take 2 mg by mouth as needed for diarrhea or loose stools.     Historical Provider, MD  LORazepam (ATIVAN) 0.5 MG tablet Take 0.5 mg by mouth every 8 (eight) hours as needed for anxiety (agitation).    Historical Provider,  MD  magnesium hydroxide (MILK OF MAGNESIA) 400 MG/5ML suspension Take 30 mLs by mouth at bedtime as needed for mild constipation.     Historical Provider, MD  memantine (NAMENDA XR) 7 MG CP24 24 hr capsule Take 7 mg by mouth daily.    Historical Provider, MD  mirtazapine (REMERON) 15 MG tablet Take 15 mg by mouth at bedtime.    Historical Provider, MD  neomycin-bacitracin-polymyxin (NEOSPORIN) [phone] ointment Apply 1 application topically daily as needed (for skin tears,abrasions,minor irritation).     Historical Provider, MD  Nutritional Supplements (GLUCERNA SHAKE PO) Take 1 each by mouth 3 (three) times daily. Mighty Shakes    Historical Provider, MD  Skin  Protectants, Misc. (MINERIN) CREA Apply 1 application topically 2 (two) times daily. To entire body    Historical Provider, MD  tamsulosin (FLOMAX) 0.4 MG CAPS capsule Take 1 capsule (0.4 mg total) by mouth daily. 09/27/14   Brittainy Sherlynn Carbon, PA-C  traMADol (ULTRAM) 50 MG tablet Take 100 mg by mouth every 6 (six) hours as needed for moderate pain or severe pain (pain). 06/20/14   Karie Soda, MD   BP 141/89 mmHg  Pulse 62  Temp(Src) 97.6 F (36.4 C) (Oral)  Resp 16  SpO2 100% Physical Exam  Constitutional: He is oriented to person, place, and time. He appears well-developed and well-nourished.  HENT:  Head: Normocephalic and atraumatic.  Eyes: Conjunctivae and EOM are normal. Pupils are equal, round, and reactive to light. Right eye exhibits no discharge. Left eye exhibits no discharge.  Neck: Normal range of motion. Neck supple.  Cardiovascular: Normal rate, regular rhythm, normal heart sounds and intact distal pulses.   Pulmonary/Chest: Effort normal and breath sounds normal. No respiratory distress.  Abdominal: Soft. He exhibits no distension. There is no tenderness.  Musculoskeletal: Normal range of motion.  Neurological: He is alert and oriented to person, place, and time.  5/5 strength in major muscle groups of  bilateral upper and lower extremities. Speech normal. No facial asymetry.   Skin: Skin is warm and dry.  Psychiatric: He has a normal mood and affect. Judgment normal.  Nursing note and vitals reviewed.   ED Course  Procedures (including critical care time) Labs Review Labs Reviewed  CBC  BASIC METABOLIC PANEL  URINALYSIS, ROUTINE W REFLEX MICROSCOPIC (NOT AT [facility])    Imaging Review No results found. I have personally reviewed and evaluated these images and lab results as part of my medical decision-making.   EKG Interpretation None      MDM   Final diagnoses:  None    Moving all 4 extremities equally.  Vital signs are normal.  Patient is without  vision complaints at this time.  Discharge home in good condition.    Azalia Bilis, MD 10/12/15 [phone]

## 2015-10-12 NOTE — ED Notes (Signed)
[facility] called  

## 2015-12-01 ENCOUNTER — Emergency Department (HOSPITAL_COMMUNITY)
Admission: EM | Admit: 2015-12-01 | Discharge: 2015-12-05 | Disposition: A | Discharge status: Home / Self Care | Payer: Medicare Other | Attending: Emergency Medicine | Admitting: Emergency Medicine

## 2015-12-01 ENCOUNTER — Encounter (HOSPITAL_COMMUNITY): Payer: Self-pay | Admitting: *Deleted

## 2015-12-01 DIAGNOSIS — Z7982 Long term (current) use of aspirin: Secondary | ICD-10-CM | POA: Insufficient documentation

## 2015-12-01 DIAGNOSIS — Z79899 Other long term (current) drug therapy: Secondary | ICD-10-CM | POA: Diagnosis not present

## 2015-12-01 DIAGNOSIS — Z8673 Personal history of transient ischemic attack (TIA), and cerebral infarction without residual deficits: Secondary | ICD-10-CM | POA: Insufficient documentation

## 2015-12-01 DIAGNOSIS — F0391 Unspecified dementia with behavioral disturbance: Secondary | ICD-10-CM | POA: Diagnosis not present

## 2015-12-01 DIAGNOSIS — F918 Other conduct disorders: Secondary | ICD-10-CM | POA: Diagnosis present

## 2015-12-01 DIAGNOSIS — F03918 Unspecified dementia, unspecified severity, with other behavioral disturbance: Secondary | ICD-10-CM

## 2015-12-01 DIAGNOSIS — R451 Restlessness and agitation: Secondary | ICD-10-CM | POA: Diagnosis present

## 2015-12-01 DIAGNOSIS — I10 Essential (primary) hypertension: Secondary | ICD-10-CM | POA: Insufficient documentation

## 2015-12-01 DIAGNOSIS — Z049 Encounter for examination and observation for unspecified reason: Secondary | ICD-10-CM

## 2015-12-01 LAB — VALPROIC ACID LEVEL: Valproic Acid Lvl: <10 ug/mL — ABNORMAL LOW (ref 50.0–100.0)

## 2015-12-01 LAB — COMPREHENSIVE METABOLIC PANEL
ALT: 13 U/L — ABNORMAL LOW (ref 17–63)
AST: 23 U/L (ref 15–41)
Albumin: 4.1 g/dL (ref 3.5–5.0)
Alkaline Phosphatase: 62 U/L (ref 38–126)
Anion gap: 11 (ref 5–15)
BUN: 21 mg/dL — ABNORMAL HIGH (ref 6–20)
CO2: 19 mmol/L — ABNORMAL LOW (ref 22–32)
Calcium: 9.2 mg/dL (ref 8.9–10.3)
Chloride: 109 mmol/L (ref 101–111)
Creatinine, Ser: 1.24 mg/dL (ref 0.61–1.24)
GFR calc Af Amer: >60 mL/min (ref >60)
GFR calc non Af Amer: 54 mL/min — ABNORMAL LOW (ref >60)
Glucose, Bld: 96 mg/dL (ref 65–99)
Potassium: 3.3 mmol/L — ABNORMAL LOW (ref 3.5–5.1)
Sodium: 139 mmol/L (ref 135–145)
Total Bilirubin: 0.5 mg/dL (ref 0.3–1.2)
Total Protein: 6.6 g/dL (ref 6.5–8.1)

## 2015-12-01 LAB — URINE MICROSCOPIC-ADD ON
Squamous Epithelial / HPF: NONE SEEN
WBC, UA: NONE SEEN WBC/hpf (ref 0–5)

## 2015-12-01 LAB — CBC WITH DIFFERENTIAL/PLATELET
Basophils Absolute: 0 10*3/uL (ref 0.0–0.1)
Basophils Relative: 0 %
Eosinophils Absolute: 0 10*3/uL (ref 0.0–0.7)
Eosinophils Relative: 1 %
HCT: 41.9 % (ref 39.0–52.0)
HEMOGLOBIN: 14.2 g/dL (ref 13.0–17.0)
LYMPHS ABS: 1.1 10*3/uL (ref 0.7–4.0)
LYMPHS PCT: 12 %
MCH: 30.1 pg (ref 26.0–34.0)
MCHC: 33.9 g/dL (ref 30.0–36.0)
MCV: 88.8 fL (ref 78.0–100.0)
MONOS PCT: 7 %
Monocytes Absolute: 0.6 10*3/uL (ref 0.1–1.0)
NEUTROS PCT: 80 %
Neutro Abs: 6.9 10*3/uL (ref 1.7–7.7)
Platelets: 178 10*3/uL (ref 150–400)
RBC: 4.72 MIL/uL (ref 4.22–5.81)
RDW: 13.3 % (ref 11.5–15.5)
WBC: 8.6 10*3/uL (ref 4.0–10.5)

## 2015-12-01 LAB — URINALYSIS, ROUTINE W REFLEX MICROSCOPIC
Bilirubin Urine: NEGATIVE
Glucose, UA: NEGATIVE mg/dL
Ketones, ur: NEGATIVE mg/dL
Leukocytes, UA: NEGATIVE
Nitrite: NEGATIVE
Protein, ur: NEGATIVE mg/dL
Specific Gravity, Urine: 1.011 (ref 1.005–1.030)
pH: 8.5 — ABNORMAL HIGH (ref 5.0–8.0)

## 2015-12-01 MED ORDER — LORAZEPAM 0.5 MG PO TABS
0.5000 mg | ORAL_TABLET | Freq: Three times a day (TID) | ORAL | Status: DC | PRN
  Administered 2015-12-02 – 2015-12-04 (×2): 0.5 mg via ORAL
  Filled 2015-12-01 (×2): qty 1

## 2015-12-01 MED ORDER — MAGNESIUM HYDROXIDE 400 MG/5ML PO SUSP: 30.0000 mL | Freq: Every evening | ORAL | Status: DC | PRN

## 2015-12-01 MED ORDER — MIRTAZAPINE 30 MG PO TABS
30.0000 mg | ORAL_TABLET | Freq: Every day | ORAL | Status: DC
  Administered 2015-12-01 – 2015-12-04 (×4): 30 mg via ORAL
  Filled 2015-12-01 (×4): qty 1

## 2015-12-01 MED ORDER — TRAMADOL HCL 50 MG PO TABS
100.0000 mg | ORAL_TABLET | Freq: Four times a day (QID) | ORAL | Status: DC | PRN
  Administered 2015-12-02 – 2015-12-04 (×2): 100 mg via ORAL
  Filled 2015-12-01 (×2): qty 2

## 2015-12-01 MED ORDER — LORAZEPAM 2 MG/ML IJ SOLN: 2.0000 mg | Freq: Once | INTRAMUSCULAR | Status: DC

## 2015-12-01 MED ORDER — LOPERAMIDE HCL 2 MG PO CAPS: 2.0000 mg | ORAL_CAPSULE | ORAL | Status: DC | PRN

## 2015-12-01 MED ORDER — TAMSULOSIN HCL 0.4 MG PO CAPS
0.4000 mg | ORAL_CAPSULE | Freq: Every day | ORAL | Status: DC
  Administered 2015-12-02 – 2015-12-05 (×4): 0.4 mg via ORAL
  Filled 2015-12-01 (×4): qty 1

## 2015-12-01 MED ORDER — LIDOCAINE HCL 2 % EX GEL
1.0000 "application " | Freq: Once | CUTANEOUS | Status: AC
  Administered 2015-12-01: 1 via URETHRAL
  Filled 2015-12-01: qty 11

## 2015-12-01 MED ORDER — DIVALPROEX SODIUM 125 MG PO CSDR
250.0000 mg | DELAYED_RELEASE_CAPSULE | Freq: Two times a day (BID) | ORAL | Status: DC
  Administered 2015-12-01: 250 mg via ORAL
  Filled 2015-12-01 (×3): qty 2

## 2015-12-01 MED ORDER — GUAIFENESIN 100 MG/5ML PO SOLN
200.0000 mg | Freq: Four times a day (QID) | ORAL | Status: DC | PRN
  Filled 2015-12-01: qty 10

## 2015-12-01 MED ORDER — ACETAMINOPHEN 325 MG PO TABS: 650.0000 mg | ORAL_TABLET | ORAL | Status: DC | PRN

## 2015-12-01 MED ORDER — LORAZEPAM 2 MG/ML IJ SOLN
2.0000 mg | Freq: Once | INTRAMUSCULAR | Status: AC
  Administered 2015-12-01: 2 mg via INTRAMUSCULAR
  Filled 2015-12-01: qty 1

## 2015-12-01 MED ORDER — ONDANSETRON HCL 4 MG PO TABS: 4.0000 mg | ORAL_TABLET | Freq: Three times a day (TID) | ORAL | Status: DC | PRN

## 2015-12-01 MED ORDER — IBUPROFEN 200 MG PO TABS: 600.0000 mg | ORAL_TABLET | Freq: Three times a day (TID) | ORAL | Status: DC | PRN

## 2015-12-01 MED ORDER — MIRTAZAPINE 30 MG PO TABS
15.0000 mg | ORAL_TABLET | Freq: Every day | ORAL | Status: DC
  Administered 2015-12-01: 15 mg via ORAL
  Filled 2015-12-01: qty 1

## 2015-12-01 MED ORDER — MEMANTINE HCL ER 7 MG PO CP24
7.0000 mg | ORAL_CAPSULE | Freq: Every day | ORAL | Status: DC
  Administered 2015-12-02 – 2015-12-05 (×4): 7 mg via ORAL
  Filled 2015-12-01 (×5): qty 1
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
## 2015-12-01 NOTE — ED Notes (Signed)
Pt observed to be calm and cooperative.  No aggressive behavior noted.
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
## 2015-12-01 NOTE — ED Notes (Signed)
Bed: [facility]06 Expected date:  Expected time:  Means of arrival:  Comments: EVS

## 2015-12-02 ENCOUNTER — Emergency Department (HOSPITAL_COMMUNITY): Payer: Medicare Other

## 2015-12-02 DIAGNOSIS — R451 Restlessness and agitation: Secondary | ICD-10-CM | POA: Diagnosis not present

## 2015-12-02 DIAGNOSIS — F0391 Unspecified dementia with behavioral disturbance: Secondary | ICD-10-CM | POA: Diagnosis not present

## 2015-12-02 MED ORDER — QUETIAPINE FUMARATE 50 MG PO TABS
50.0000 mg | ORAL_TABLET | Freq: Every day | ORAL | Status: DC
  Administered 2015-12-02: 50 mg via ORAL
  Filled 2015-12-02: qty 1

## 2015-12-02 MED ORDER — DIVALPROEX SODIUM 125 MG PO CSDR
500.0000 mg | DELAYED_RELEASE_CAPSULE | Freq: Two times a day (BID) | ORAL | Status: DC
  Administered 2015-12-02 – 2015-12-05 (×7): 500 mg via ORAL
  Filled 2015-12-02 (×8): qty 4

## 2015-12-02 NOTE — Progress Notes (Signed)
Disposition CSW completed patient referrals to the following Gero[facility]Psych inpatient facilities:  [facility] [place]  CSW will follow patient for placement needs.  [facility] Disposition CSW [phone]

## 2015-12-02 NOTE — ED Notes (Signed)
Charge nurse and AC aware of needed sitter  

## 2015-12-02 NOTE — ED Notes (Signed)
Patient says he can feed himself, but then doesn't do it.  Sitter is having to feed him.

## 2015-12-02 NOTE — ED Notes (Signed)
Patient ate a sandwich.
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
## 2015-12-02 NOTE — ED Notes (Signed)
Pt is up in bathroom presently,  Sitter is in room with pt,  Pt appears to be somewhat agitated, confused,  He is alert to himself only, even periods of lucidity he did say I am at [facility] , Right?

## 2015-12-02 NOTE — ED Notes (Signed)
Patient showing no signs of the agitated state he was in earlier this AM.  He smiled held out his hand to me when I came into the room.  Still not talking much, but affect very pleasant now.
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
## 2015-12-02 NOTE — ED Notes (Signed)
Patient walking in halls assisted by sitter.  Smiling, gesturing hello to the staff.  Pleasant cooperative behavior.

## 2015-12-02 NOTE — BH Assessment (Addendum)
Assessment Note  Jacob York is an 80 y.o. male diagnosed with Alzheimer's disease, per staff from [facility] where he has lived for the past 2 years.   Staff at [facility] report that pt was pushing, hitting, and fighting with other residents tonight.  Staff also report pt has instances of aggressive behavior followed by several good days, and then more aggressive behavior. The aggressive behavior is becoming more frequent and more intense.  WO staff also report pt is not sleeping at night and is often up and pacing the halls until 5am.  TTS attempted to speak with pt, however, pt was not able to provide any useful information.  Pt stated that he did not know why he was at the hospital and did not remember anything that had happened tonight.  Paperwork from [employer] personal contact: Dannielle Karvonen, [phone].  Diagnosis: Alzheimer's disease  Past Medical History:  Past Medical History  Diagnosis Date  . Hypertension   . Stroke (HCC)   . Dementia   . Urinary retention 09/22/2014    Past Surgical History  Procedure Laterality Date  . Inguinal hernia repair N/A 06/20/2014    Procedure: LAPAROSCOPIC BILATERAL INGUINAL HERNIA REPAIR, LEFT FEMORAL AND INCARCERATED SUPRA UMBILICAL HERNIA REPAIR WITH MESH;  Surgeon: Karie Soda, MD;  Location: [facility];  Service: General;  Laterality: N/A;  . Insertion of mesh N/A 06/20/2014    Procedure: INSERTION OF MESH;  Surgeon: Karie Soda, MD;  Location: [facility];  Service: General;  Laterality: N/A;    Family History:  Family History  Problem Relation Age of Onset  . Stroke Mother   . Hypertension Mother     Social History:  reports that he has never smoked. He has never used smokeless tobacco. He reports that he does not drink alcohol or use illicit drugs.  Additional Social History:     CIWA: CIWA-Ar BP: 151/97 mmHg Pulse Rate: 77 COWS:    Allergies: No Known Allergies  Home Medications:  (Not in a hospital admission)  OB/GYN Status:   No LMP for male patient.  General Assessment Data Location of Assessment: [facility] TTS Assessment: In system Is this a Tele or Face-to-Face Assessment?: Face-to-Face Is this an Initial Assessment or a Re-assessment for this encounter?: Initial Assessment Is patient pregnant?: No Pregnancy Status: No Living Arrangements: Other (Comment) (retirement home) Can pt return to current living arrangement?:  (unknown) Is patient capable of signing voluntary admission?: No Referral Source: Other (retirement home)     Crisis Care Plan Living Arrangements: Other (Comment) (retirement home)     Risk to self with the past 6 months Suicidal Ideation:  (pt denies) Has patient been a risk to self within the past 6 months prior to admission? :  (unknown) Suicidal Intent:  (unknown) Has patient had any suicidal intent within the past 6 months prior to admission? :  (unknown) Is patient at risk for suicide?:  (unknown) Suicidal Plan?:  (pt denies) Has patient had any suicidal plan within the past 6 months prior to admission? :  (unknown) What has been your use of drugs/alcohol within the last 12 months?:  (unknown)  Risk to Others within the past 6 months Homicidal Ideation:  (pt denies)     Mental Status Report Appearance/Hygiene: Disheveled Eye Contact: Good Motor Activity: Other (Comment) (restrained) Speech: Unremarkable Level of Consciousness: Alert Mood: Irritable Thought Processes:  (pt from memory care unit) Orientation: Person  Advance Directives (For Healthcare) Does patient have an advance directive?: No          Disposition: Discussed this pt with Dr Margurite Auerbach of [facility] and wit Roxy Horseman of [facility].  Both agree that pt remain in [facility] overnight for evaluation by psychiatry in AM.    On Site Evaluation by:   Reviewed with Physician:    Lorri Frederick 12/02/2015 12:47 AM

## 2015-12-02 NOTE — ED Notes (Signed)
Patient remains asleep.

## 2015-12-02 NOTE — Clinical Social Work Note (Signed)
CSW called and spoke with med tech staff Yasmine at pt's ALF/[facility] to gather information regarding pt history and behaviors.  Per staff, she had been working at ALF for two years and pt was at the ALF before she was employed there.  She reported that pt had not had any aggressive behaviors in his history at the ALF.  She reported  that yesterday pt's behavior "came out of no where" and he "charged after people acting like he was going to hit them" but he did not hit anyone.  Per ALF staff pt had this similar behavior one time one month ago where he acted like he was going to hit someone but he did not.    Elray Buba, LCSW [facility] Clinical Social Worker - Weekend Coverage cell #: [phone]

## 2015-12-02 NOTE — ED Notes (Signed)
Patient combative.  Trying to hit nurse and sitter.  Will not allow EKG or bath to be done.

## 2015-12-02 NOTE — ED Notes (Addendum)
Pt is in his bed with a sitter at the bedside. Pt is pleasant and appears calm this afternoon. He introduced himself to Retail banker and stated,'it is nice to meet you."Pt voided all over the floor. Pt laughed when given a urinal. He appeared to not know he was not suppose to void on the floor. (4:20pm ) Pt voided again  All over the floor (6pm ) pt was placed in a diaper . 6:30pm -pt ate 100% of his dinner. 7:15p Pt became very upset and was crying saying, "what is wrong something is not right. Is a family member sick. " pt was given .5 mg of ativan and  of tramadol. (7:15pm )Report to [place]. Pt kept asking the writer who she was and called the tech his wife. Pt is only alert to person.

## 2015-12-03 DIAGNOSIS — F0391 Unspecified dementia with behavioral disturbance: Secondary | ICD-10-CM | POA: Diagnosis not present

## 2015-12-03 LAB — URINE CULTURE: Culture: 1000

## 2015-12-03 MED ORDER — DIPHENHYDRAMINE HCL 25 MG PO CAPS
25.0000 mg | ORAL_CAPSULE | Freq: Once | ORAL | Status: AC
  Administered 2015-12-03: 25 mg via ORAL
  Filled 2015-12-03: qty 1

## 2015-12-03 MED ORDER — DIPHENHYDRAMINE HCL 25 MG PO CAPS
25.0000 mg | ORAL_CAPSULE | Freq: Once | ORAL | Status: AC
  Administered 2015-12-04: 25 mg via ORAL

## 2015-12-03 NOTE — BH Assessment (Signed)
Patient was reassessed by TTS.   Patient was sleeping in his room. Patients sitter stated that the patient did not eat his breakfast this morning or take his morning medications. Patient was asleep when this writer arrived and was woken up to for re-assessment. Patient asked several times what his first and last name was an appeared confused. Patient was able to provide his first and last name. Patient states that he does not know why he is here and does not need anything. Patient denies SI/HI and AVH. Patient denies any questions or concerns and went back to sleep.   Nanine Means, DNP and Dr. Elsie Saas continue to recommend Geropsych at this time.   Davina Poke, LCSW Therapeutic Triage Specialist [facility] 12/03/2015 9:50 AM

## 2015-12-03 NOTE — Progress Notes (Addendum)
Pt is asleep but is easily awakened. He did not want to eat breakfast but did take his am medications. Respirations are regular and non labored. . Pt remains with a sitter. Phoned NP that pt is very lethargic and sleepy. Pt refused to wake up to eat breakfast and lunch. NP made aware. (1pm )Phoned EDP as pt was itching primarily on his back,. Pt was placed in a cotton gown and cotton sheets were placed on the bed. Pt does not appear short of breath . He does not appear to have any reddness on any other extremities. Will monitor closely. Per EDP pt will receive  of benedrly po. Pt stopped itching and is asleep. (3:05pm )

## 2015-12-04 DIAGNOSIS — F0391 Unspecified dementia with behavioral disturbance: Secondary | ICD-10-CM | POA: Diagnosis not present

## 2015-12-04 MED ORDER — DIPHENHYDRAMINE HCL 25 MG PO CAPS
25.0000 mg | ORAL_CAPSULE | Freq: Three times a day (TID) | ORAL | Status: DC | PRN
  Administered 2015-12-05: 25 mg via ORAL
  Filled 2015-12-04 (×2): qty 1

## 2015-12-04 NOTE — ED Notes (Signed)
Pt stating "I need my shoes.  Where are my shoes.  My brother put them in a paper bag.  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

## 2015-12-04 NOTE — BH Assessment (Signed)
BHH Assessment Progress Note  Per Carolanne Grumbling, MD, this pt requires psychiatric hospitalization at this time.  Pt has been referred to [facility] and to [facility].  Decision is pending as of this writing.  Doylene Canning, MA Triage Specialist [phone]

## 2015-12-04 NOTE — ED Notes (Signed)
Pharmacy contacted about missing 2200 Remeron dose

## 2015-12-04 NOTE — ED Notes (Signed)
Dr. Dalene Seltzer states to continue Benadryl and she will evaluate Pt.

## 2015-12-04 NOTE — BH Assessment (Signed)
Patient will be reassessed later today by TTS. Patients nurse informed this Clinical research associate that patient is sleeping at this time. Patient was pacing all night and was given medication for agitation/restlessness.  Will check with nurse at a later time to speak with patient.   Davina Poke, LCSW Therapeutic Triage Specialist [facility] 12/04/2015 10:01 AM

## 2015-12-04 NOTE — BH Assessment (Signed)
Patient was reassessed by TTS.   Patient was in his room eating. Patient was asleep all day and recently woke up for an assessment.   Patient is alert but not oriented. Patient states that he does not know his name or where he is. Patient states that he does not know why he came into the hospital. When asked patient if he got rest, patient states "why are you doing this too me? I don't kow." no further questions were asked in an effort not to further agitate the patient.    Dr. Ladona Ridgel and Nanine Means, DNP continues to recommend inpatient treatment at this time.   Davina Poke, LCSW Therapeutic Triage Specialist [facility] 12/04/2015 6:54 PM

## 2015-12-04 NOTE — ED Notes (Signed)
Pt sleeping. 

## 2015-12-05 DIAGNOSIS — R451 Restlessness and agitation: Secondary | ICD-10-CM | POA: Diagnosis not present

## 2015-12-05 DIAGNOSIS — F0391 Unspecified dementia with behavioral disturbance: Secondary | ICD-10-CM | POA: Diagnosis not present

## 2015-12-05 NOTE — ED Notes (Addendum)
Previous sitter took pt to the restroom twice last night. I ask the pt did he need to go to rest room he applied no!

## 2015-12-05 NOTE — Progress Notes (Signed)
CSW was informed by NP patient can return to [facility]. CSW called [facility] and spoke with the Care Coordinator. She stated she would need patient's updated med list. CSW obtained fax# [phone].   CSW staffed case with Nurse to call [facility] for patient to be transported back to [facility]. Nurse reports she has called [facility] and she has patient's med list and other information printed to send with patient.   Elenore Paddy [phone] ED CSW 12/05/2015 2:39 PM

## 2015-12-05 NOTE — ED Notes (Signed)
[facility] called for transportation back to [facility]. 

## 2015-12-05 NOTE — BHH Suicide Risk Assessment (Signed)
Suicide Risk Assessment  Discharge Assessment   [facility] Discharge Suicide Risk Assessment   Principal Problem: Dementia with behavioral disturbance Discharge Diagnoses:  Patient Active Problem List   Diagnosis Date Noted  . Agitation [R45.1] 12/02/2015    Priority: High  . Dementia with behavioral disturbance [F03.91]     Priority: High  . UTI (lower urinary tract infection) [N39.0] 03/03/2015  . Pre-syncope [R55] 10/15/2014  . H/O urinary retention [Z87.448] 09/22/2014  . Bigeminy [I49.9] 09/21/2014  . Syncope [R55] 09/21/2014  . Bradycardia [R00.1] 09/21/2014  . Chronic renal insufficiency, stage III (moderate) [N18.9] 09/21/2014  . Ventral hernia [K43.9] 06/20/2014  . Bilateral inguinal hernia (BIH) L>>R [K40.20] 05/24/2014  . Incarcerated ventral hernia - 4cm supraumbilical [K46.0] 05/24/2014  . Diastasis recti [M62.08] 05/24/2014  . Memory loss [R41.3] 05/24/2014  . Hypertension [I10]     Total Time spent with patient: 30 minutes  Musculoskeletal: Strength & Muscle Tone: within normal limits Gait & Station: normal Patient leans: N/A  Psychiatric Specialty Exam: Review of Systems  Constitutional: Negative.   HENT: Negative.   Eyes: Negative.   Respiratory: Negative.   Cardiovascular: Negative.   Gastrointestinal: Negative.   Genitourinary: Negative.   Musculoskeletal: Negative.   Skin: Negative.   Neurological: Negative.   Endo/Heme/Allergies: Negative.   Psychiatric/Behavioral: Positive for memory loss.       Negative     Blood pressure 106/71, pulse 63, temperature 97.9 F (36.6 C), temperature source Oral, resp. rate 18, SpO2 96 %.There is no weight on file to calculate BMI.  General Appearance: Casual  Eye Contact::  Fair  Speech:  Normal Rate  Volume:  Normal  Mood:  Euthymic  Affect:  Blunt  Thought Process:  confused  Orientation:  Other:  person  Thought Content:  unsure due to memory loss  Suicidal Thoughts:  No  Homicidal Thoughts:  No   Memory:  Immediate;   Poor Recent;   Poor Remote;   Poor  Judgement:  Impaired  Insight:  Lacking  Psychomotor Activity:  Normal   Concentration:  Poor  Recall:  Poor  Fund of Knowledge:Poor  Language: Fair  Akathisia:  No  Handed:  Right  AIMS (if indicated):     Assets:  Housing Leisure Time Resilience Social Support  ADL's:  Impaired  Cognition: Impaired,  Severe  Sleep:      Mental Status Per Nursing Assessment::   On Admission:   Agitation  Demographic Factors:  Male, Age 80 or older and Caucasian  Loss Factors: NA  Historical Factors: NA  Risk Reduction Factors:   Living with another person, especially a relative, Positive social support and Positive therapeutic relationship  Continued Clinical Symptoms:  None  Cognitive Features That Contribute To Risk:  Loss of executive function    Suicide Risk:  Minimal: No identifiable suicidal ideation.  Patients presenting with no risk factors but with morbid ruminations; may be classified as minimal risk based on the severity of the depressive symptoms    Plan Of Care/Follow-up recommendations:  Activity:  as tolerated Diet:  heart healthy diet  Lachlan Mckim, NP 12/05/2015, 11:37 AM

## 2015-12-05 NOTE — BH Assessment (Signed)
BHH Assessment Progress Note  At 09:45 Jacob York at [facility] reports that pt has been declined for admission to their facility due to pt acuity.  Doylene Canning, MA Triage Specialist [phone]

## 2015-12-05 NOTE — ED Notes (Signed)
Report called to [place] turner at [facility].

## 2015-12-05 NOTE — ED Notes (Signed)
Attempted to get BP and other vitals. Patient swatted at the writer and stated, "You will get my blood pressure when I decide to give it to you."

## 2015-12-05 NOTE — Consult Note (Signed)
[facility] Face-to-Face Psychiatry Consult   Reason for Consult:  Agitation  Referring Physician:  EDP Patient Identification: Jacob York MRN:  [id number] Principal Diagnosis: Dementia with behavioral disturbance Diagnosis:   Patient Active Problem List   Diagnosis Date Noted  . Agitation [R45.1] 12/02/2015    Priority: High  . Dementia with behavioral disturbance [F03.91]     Priority: High  . UTI (lower urinary tract infection) [N39.0] 03/03/2015  . Pre-syncope [R55] 10/15/2014  . H/O urinary retention [Z87.448] 09/22/2014  . Bigeminy [I49.9] 09/21/2014  . Syncope [R55] 09/21/2014  . Bradycardia [R00.1] 09/21/2014  . Chronic renal insufficiency, stage III (moderate) [N18.9] 09/21/2014  . Ventral hernia [K43.9] 06/20/2014  . Bilateral inguinal hernia (BIH) L>>R [K40.20] 05/24/2014  . Incarcerated ventral hernia - 4cm supraumbilical [K46.0] 05/24/2014  . Diastasis recti [M62.08] 05/24/2014  . Memory loss [R41.3] 05/24/2014  . Hypertension [I10]     Total Time spent with patient: 30 minutes  Subjective:   Jacob York is a 80 y.o. male patient has stabilized and can return to his SNF.  HPI:  On admission:  80 y.o. male diagnosed with Alzheimer's disease, per staff from [facility] where he has lived for the past 2 years. Staff at [facility] report that pt was pushing, hitting, and fighting with other residents tonight. Staff also report pt has instances of aggressive behavior followed by several good days, and then more aggressive behavior. The aggressive behavior is becoming more frequent and more intense. WO staff also report pt is not sleeping at night and is often up and pacing the halls until 5am. TTS attempted to speak with pt, however, pt was not able to provide any useful information. Pt stated that he did not know why he was at the hospital and did not remember anything that had happened tonight. Paperwork from [employer] personal contact: Dannielle Karvonen,  [phone].  Today:  Patient has stabilized on his medication changes and has been calm without agitation since yesterday am.  He is stable to return to his SNF.  Past Psychiatric History: Dementia  Risk to Self: Suicidal Ideation: No (pt denies) Suicidal Intent:  (unknown) Is patient at risk for suicide?:  (unknown) Suicidal Plan?:  (pt denies) What has been your use of drugs/alcohol within the last 12 months?:  (unknown) Risk to Others: Homicidal Ideation: No (pt denies) Assessment of Violence: On admission Violent Behavior Description: hitting and fighting with other residents Does patient have access to weapons?: No Criminal Charges Pending?: No Does patient have a court date: No Prior Inpatient Therapy:   Prior Outpatient Therapy:    Past Medical History:  Past Medical History  Diagnosis Date  . Hypertension   . Stroke (HCC)   . Dementia   . Urinary retention 09/22/2014    Past Surgical History  Procedure Laterality Date  . Inguinal hernia repair N/A 06/20/2014    Procedure: LAPAROSCOPIC BILATERAL INGUINAL HERNIA REPAIR, LEFT FEMORAL AND INCARCERATED SUPRA UMBILICAL HERNIA REPAIR WITH MESH;  Surgeon: Karie Soda, MD;  Location: [facility];  Service: General;  Laterality: N/A;  . Insertion of mesh N/A 06/20/2014    Procedure: INSERTION OF MESH;  Surgeon: Karie Soda, MD;  Location: [facility] ORS;  Service: General;  Laterality: N/A;   Family History:  Family History  Problem Relation Age of Onset  . Stroke Mother   . Hypertension Mother    Family Psychiatric  History: None Social History:  History  Alcohol Use No     History  Drug  Use No    Social History   Social History  . Marital Status: Single    Spouse Name: N/A  . Number of Children: N/A  . Years of Education: N/A   Social History Main Topics  . Smoking status: Never Smoker   . Smokeless tobacco: Never Used  . Alcohol Use: No  . Drug Use: No  . Sexual Activity: Not Asked   Other Topics Concern  . None    Social History Narrative   Additional Social History:    Allergies:  No Known Allergies  Labs:  No results found for this or any previous visit (from the past 48 hour(s)).  Current Facility-Administered Medications  Medication Dose Route Frequency Provider Last Rate Last Dose  . acetaminophen (TYLENOL) tablet 650 mg  650 mg Oral Q4H PRN Lorre Nick, MD      . diphenhydrAMINE (BENADRYL) capsule 25 mg  25 mg Oral Q8H PRN Rolland Porter, MD   25 mg at 12/05/15 0233  . divalproex (DEPAKOTE SPRINKLE) capsule 500 mg  500 mg Oral BID Charm Rings, NP   500 mg at 12/05/15 0981  . guaiFENesin (ROBITUSSIN) 100 MG/5ML solution 200 mg  200 mg Oral Q6H PRN Lorre Nick, MD      . ibuprofen (ADVIL,MOTRIN) tablet 600 mg  600 mg Oral Q8H PRN Lorre Nick, MD      . loperamide (IMODIUM) capsule 2 mg  2 mg Oral PRN Lorre Nick, MD      . LORazepam (ATIVAN) tablet 0.5 mg  0.5 mg Oral Q8H PRN Lorre Nick, MD   0.5 mg at 12/04/15 0315  . magnesium hydroxide (MILK OF MAGNESIA) suspension 30 mL  30 mL Oral QHS PRN Lorre Nick, MD      . memantine (NAMENDA XR) 24 hr capsule 7 mg  7 mg Oral Daily Lorre Nick, MD   7 mg at 12/05/15 1914  . mirtazapine (REMERON) tablet 30 mg  30 mg Oral QHS Lorre Nick, MD   30 mg at 12/04/15 2133  . ondansetron (ZOFRAN) tablet 4 mg  4 mg Oral Q8H PRN Lorre Nick, MD      . tamsulosin [facility]) capsule 0.4 mg  0.4 mg Oral Daily Lorre Nick, MD   0.4 mg at 12/05/15 0955  . traMADol (ULTRAM) tablet 100 mg  100 mg Oral Q6H PRN Lorre Nick, MD   100 mg at 12/04/15 7829   Current Outpatient Prescriptions  Medication Sig Dispense Refill  . aspirin 81 MG chewable tablet Chew 81 mg by mouth daily.    . cholecalciferol (VITAMIN D) 1000 UNITS tablet Take 1,000 Units by mouth daily.    . divalproex (DEPAKOTE SPRINKLE) 125 MG capsule Take 250 mg by mouth 2 (two) times daily.     Marland Kitchen LORazepam (ATIVAN) 0.5 MG tablet Take 0.5 mg by mouth every 8 (eight) hours as needed for  anxiety (agitation).    . memantine (NAMENDA XR) 7 MG CP24 24 hr capsule Take 7 mg by mouth daily.    . mirtazapine (REMERON) 15 MG tablet Take 15 mg by mouth at bedtime.    . mirtazapine (REMERON) 30 MG tablet Take 30 mg by mouth at bedtime.    . Nutritional Supplements (GLUCERNA SHAKE PO) Take 1 each by mouth 3 (three) times daily. Mighty Shakes    . Skin Protectants, Misc. (MINERIN) CREA Apply 1 application topically 2 (two) times daily. To entire body    . tamsulosin (FLOMAX) 0.4 MG CAPS capsule Take 1 capsule (  0.4 mg total) by mouth daily. 30 capsule 5  . acetaminophen (TYLENOL) 500 MG tablet Take 500 mg by mouth every 4 (four) hours as needed for mild pain or moderate pain (pain).     Marland Kitchen alum & mag hydroxide-simeth (MAALOX PLUS) 400-400-40 MG/5ML suspension Take 30 mLs by mouth every 6 (six) hours as needed for indigestion.    Marland Kitchen aluminum-magnesium hydroxide-simethicone (MAALOX) 200-200-20 MG/5ML SUSP Take 30 mLs by mouth as needed (heartburn/indigestion). Standing order    . cetirizine (ZYRTEC) 10 MG tablet Take 10 mg by mouth daily as needed (itching).    Marland Kitchen guaifenesin (ROBITUSSIN) 100 MG/5ML syrup Take 200 mg by mouth every 6 (six) hours as needed for cough.     . loperamide (IMODIUM) 2 MG capsule Take 2 mg by mouth as needed for diarrhea or loose stools.     . magnesium hydroxide (MILK OF MAGNESIA) 400 MG/5ML suspension Take 30 mLs by mouth at bedtime as needed for mild constipation.     Marland Kitchen neomycin-bacitracin-polymyxin (NEOSPORIN) [phone] ointment Apply 1 application topically daily as needed (for skin tears,abrasions,minor irritation).     . traMADol (ULTRAM) 50 MG tablet Take 100 mg by mouth every 6 (six) hours as needed for moderate pain or severe pain (pain).      Musculoskeletal: Strength & Muscle Tone: within normal limits Gait & Station: normal Patient leans: N/A  Psychiatric Specialty Exam: Review of Systems  Constitutional: Negative.   HENT: Negative.   Eyes: Negative.    Respiratory: Negative.   Cardiovascular: Negative.   Gastrointestinal: Negative.   Genitourinary: Negative.   Musculoskeletal: Negative.   Skin: Negative.   Neurological: Negative.   Endo/Heme/Allergies: Negative.   Psychiatric/Behavioral: Positive for memory loss.       Negative     Blood pressure 106/71, pulse 63, temperature 97.9 F (36.6 C), temperature source Oral, resp. rate 18, SpO2 96 %.There is no weight on file to calculate BMI.  General Appearance: Casual  Eye Contact::  Fair  Speech:  Normal Rate  Volume:  Normal  Mood:  Euthymic  Affect:  Blunt  Thought Process:  confused  Orientation:  Other:  person  Thought Content:  unsure due to memory loss  Suicidal Thoughts:  No  Homicidal Thoughts:  No  Memory:  Immediate;   Poor Recent;   Poor Remote;   Poor  Judgement:  Impaired  Insight:  Lacking  Psychomotor Activity:  Normal   Concentration:  Poor  Recall:  Poor  Fund of Knowledge:Poor  Language: Fair  Akathisia:  No  Handed:  Right  AIMS (if indicated):     Assets:  Housing Leisure Time Resilience Social Support  ADL's:  Impaired  Cognition: Impaired,  Severe  Sleep:      Treatment Plan Summary: Daily contact with patient to assess and evaluate symptoms and progress in treatment, Medication management and Plan dementia with behavioral disturbances: -Crisis stabilization -Medication management:  Continue Remeron from 30 mg at bedtime for sleep issues, Depakote 500 mg BID for mood stabilization  -Individual counseling -Coordination of care for return to his SNF  Disposition: Discharge home, no psychiatric threat at this time  Nanine Means, NP 12/05/2015 11:33 AM  Patient seen for face-to-face psychiatric evaluation, case discussed with the treatment team and physician extender and formulated treatment plan. Reviewed the information documented and agree with the treatment plan.  Nanine Means 12/05/2015 11:33 AM

## 2015-12-05 NOTE — Progress Notes (Signed)
CSW consulted with nurse regarding patient. Nurse states that [facility] has been notified, and are aware that he needs transportation to facility. However, she says they stated that it was at least 20 people in front of patient.  CSW will continue to follow up.  Trish Mage [phone] ED CSW 12/05/2015 6:36 PM

## 2015-12-08 ENCOUNTER — Emergency Department (HOSPITAL_COMMUNITY): Payer: Medicare Other

## 2015-12-08 ENCOUNTER — Encounter (HOSPITAL_COMMUNITY): Payer: Self-pay | Admitting: Emergency Medicine

## 2015-12-08 ENCOUNTER — Emergency Department (HOSPITAL_COMMUNITY)
Admission: EM | Admit: 2015-12-08 | Discharge: 2015-12-09 | Disposition: A | Discharge status: Home / Self Care | Payer: Medicare Other | Attending: Emergency Medicine | Admitting: Emergency Medicine

## 2015-12-08 DIAGNOSIS — Z7982 Long term (current) use of aspirin: Secondary | ICD-10-CM | POA: Diagnosis not present

## 2015-12-08 DIAGNOSIS — I10 Essential (primary) hypertension: Secondary | ICD-10-CM | POA: Insufficient documentation

## 2015-12-08 DIAGNOSIS — F0390 Unspecified dementia without behavioral disturbance: Secondary | ICD-10-CM | POA: Diagnosis not present

## 2015-12-08 DIAGNOSIS — Z8673 Personal history of transient ischemic attack (TIA), and cerebral infarction without residual deficits: Secondary | ICD-10-CM | POA: Insufficient documentation

## 2015-12-08 DIAGNOSIS — Z79899 Other long term (current) drug therapy: Secondary | ICD-10-CM | POA: Insufficient documentation

## 2015-12-08 DIAGNOSIS — R451 Restlessness and agitation: Secondary | ICD-10-CM | POA: Diagnosis present

## 2015-12-08 LAB — COMPREHENSIVE METABOLIC PANEL
ALBUMIN: 4.4 g/dL (ref 3.5–5.0)
ALT: 12 U/L — AB (ref 17–63)
AST: 21 U/L (ref 15–41)
Alkaline Phosphatase: 73 U/L (ref 38–126)
Anion gap: 12 (ref 5–15)
BILIRUBIN TOTAL: 1 mg/dL (ref 0.3–1.2)
BUN: 30 mg/dL — AB (ref 6–20)
CO2: 20 mmol/L — ABNORMAL LOW (ref 22–32)
CREATININE: 1.27 mg/dL — AB (ref 0.61–1.24)
Calcium: 9.6 mg/dL (ref 8.9–10.3)
Chloride: 110 mmol/L (ref 101–111)
GFR calc Af Amer: >60 mL/min (ref >60)
GFR, EST NON AFRICAN AMERICAN: 53 mL/min — AB (ref >60)
GLUCOSE: 134 mg/dL — AB (ref 65–99)
POTASSIUM: 3.6 mmol/L (ref 3.5–5.1)
Sodium: 142 mmol/L (ref 135–145)
TOTAL PROTEIN: 6.8 g/dL (ref 6.5–8.1)

## 2015-12-08 LAB — CBC WITH DIFFERENTIAL/PLATELET
BASOS ABS: 0 10*3/uL (ref 0.0–0.1)
Basophils Relative: 0 %
EOS PCT: 1 %
Eosinophils Absolute: 0.1 10*3/uL (ref 0.0–0.7)
HEMATOCRIT: 44.9 % (ref 39.0–52.0)
Hemoglobin: 14.9 g/dL (ref 13.0–17.0)
LYMPHS ABS: 1.5 10*3/uL (ref 0.7–4.0)
LYMPHS PCT: 20 %
MCH: 30.1 pg (ref 26.0–34.0)
MCHC: 33.2 g/dL (ref 30.0–36.0)
MCV: 90.7 fL (ref 78.0–100.0)
MONO ABS: 0.5 10*3/uL (ref 0.1–1.0)
Monocytes Relative: 7 %
NEUTROS ABS: 5.5 10*3/uL (ref 1.7–7.7)
Neutrophils Relative %: 72 %
Platelets: 203 10*3/uL (ref 150–400)
RBC: 4.95 MIL/uL (ref 4.22–5.81)
RDW: 13.4 % (ref 11.5–15.5)
WBC: 7.7 10*3/uL (ref 4.0–10.5)

## 2015-12-08 LAB — URINALYSIS, ROUTINE W REFLEX MICROSCOPIC
GLUCOSE, UA: NEGATIVE mg/dL
HGB URINE DIPSTICK: NEGATIVE
Ketones, ur: 15 mg/dL — AB
Leukocytes, UA: NEGATIVE
Nitrite: NEGATIVE
PROTEIN: NEGATIVE mg/dL
SPECIFIC GRAVITY, URINE: 1.026 (ref 1.005–1.030)
pH: 7 (ref 5.0–8.0)

## 2015-12-08 NOTE — ED Notes (Signed)
Per EMS. [facility]. Hx dementia, oriented per norm. Facility called EMS out for agitation. Pt was recently seen here for agitation. Last dose of prescribed 0.5mg  ativan yesterday am [facility]. No violence with EMS. EMS reports pt did ramble when upset, but calmed down after speaking with EMS. Pt calm and cooperative at present.

## 2015-12-08 NOTE — ED Notes (Signed)
Report given to Rosette RevealMonique McKinney at [facility].

## 2015-12-08 NOTE — ED Notes (Addendum)
Pt got up and urinated in trash can instead of urinal. Reinforced that pt needs to urinate in urinal.

## 2015-12-08 NOTE — ED Provider Notes (Signed)
CSN: [id number]     Arrival date & time 12/08/15  1330 History   First MD Initiated Contact with Patient 12/08/15 1501     Chief Complaint  Patient presents with  . Agitation     (Consider location/radiation/quality/duration/timing/severity/associated sxs/prior Treatment) Patient is a 80 y.o. male presenting with altered mental status. The history is provided by the patient and the nursing home.  Altered Mental Status Presenting symptoms: behavior changes, confusion and disorientation   Severity:  Severe Most recent episode:  Today Episode history:  Continuous Duration:  2 days Timing:  Constant Progression:  Unchanged Chronicity:  New Context: not alcohol use    80 yo M with a cc of ams.  Per nursing home acting different from baseline.   Level V caveat ams  Past Medical History  Diagnosis Date  . Hypertension   . Stroke (HCC)   . Dementia   . Urinary retention 09/22/2014   Past Surgical History  Procedure Laterality Date  . Inguinal hernia repair N/A 06/20/2014    Procedure: LAPAROSCOPIC BILATERAL INGUINAL HERNIA REPAIR, LEFT FEMORAL AND INCARCERATED SUPRA UMBILICAL HERNIA REPAIR WITH MESH;  Surgeon: Karie Soda, MD;  Location: [facility];  Service: General;  Laterality: N/A;  . Insertion of mesh N/A 06/20/2014    Procedure: INSERTION OF MESH;  Surgeon: Karie Soda, MD;  Location: [facility];  Service: General;  Laterality: N/A;   Family History  Problem Relation Age of Onset  . Stroke Mother   . Hypertension Mother    Social History  Substance Use Topics  . Smoking status: Never Smoker   . Smokeless tobacco: Never Used  . Alcohol Use: No    Review of Systems  Unable to perform ROS: Dementia  Psychiatric/Behavioral: Positive for confusion.      Allergies  Review of patient's allergies indicates no known allergies.  Home Medications   Prior to Admission medications   Medication Sig Start Date End Date Taking? Authorizing Provider  acetaminophen (TYLENOL) 500  MG tablet Take 500 mg by mouth every 4 (four) hours as needed for mild pain or moderate pain (pain).    Yes Historical Provider, MD  alum & mag hydroxide-simeth (MAALOX PLUS) 400-400-40 MG/5ML suspension Take 30 mLs by mouth every 6 (six) hours as needed for indigestion.   Yes Historical Provider, MD  aluminum-magnesium hydroxide-simethicone (MAALOX) 200-200-20 MG/5ML SUSP Take 30 mLs by mouth as needed (heartburn/indigestion). Standing order   Yes Historical Provider, MD  aspirin 81 MG chewable tablet Chew 81 mg by mouth daily.   Yes Historical Provider, MD  cetirizine (ZYRTEC) 10 MG tablet Take 10 mg by mouth daily as needed (itching).   Yes Historical Provider, MD  cholecalciferol (VITAMIN D) 1000 UNITS tablet Take 1,000 Units by mouth daily.   Yes Historical Provider, MD  divalproex (DEPAKOTE SPRINKLE) 125 MG capsule Take 250 mg by mouth 2 (two) times daily.    Yes Historical Provider, MD  guaifenesin (ROBITUSSIN) 100 MG/5ML syrup Take 200 mg by mouth every 6 (six) hours as needed for cough.    Yes Historical Provider, MD  loperamide (IMODIUM) 2 MG capsule Take 2 mg by mouth as needed for diarrhea or loose stools.    Yes Historical Provider, MD  LORazepam (ATIVAN) 0.5 MG tablet Take 0.5 mg by mouth every 8 (eight) hours as needed for anxiety (agitation).   Yes Historical Provider, MD  magnesium hydroxide (MILK OF MAGNESIA) 400 MG/5ML suspension Take 30 mLs by mouth at bedtime as needed for mild constipation.  Yes Historical Provider, MD  memantine (NAMENDA XR) 7 MG CP24 24 hr capsule Take 7 mg by mouth daily.   Yes Historical Provider, MD  mirtazapine (REMERON) 30 MG tablet Take 30 mg by mouth at bedtime.   Yes Historical Provider, MD  neomycin-bacitracin-polymyxin (NEOSPORIN) [phone] ointment Apply 1 application topically daily as needed (for skin tears,abrasions,minor irritation).    Yes Historical Provider, MD  Nutritional Supplements (GLUCERNA SHAKE PO) Take 1 each by mouth 3 (three) times  daily. Mighty Shakes   Yes Historical Provider, MD  Skin Protectants, Misc. (MINERIN) CREA Apply 1 application topically 2 (two) times daily. To entire body   Yes Historical Provider, MD  tamsulosin (FLOMAX) 0.4 MG CAPS capsule Take 1 capsule (0.4 mg total) by mouth daily. 09/27/14  Yes Brittainy Sherlynn CarbonM Simmons, PA-C  traMADol (ULTRAM) 50 MG tablet Take 100 mg by mouth every 6 (six) hours as needed for moderate pain or severe pain (pain). 06/20/14  Yes Karie SodaSteven Gross, MD   BP 139/87 mmHg  Pulse 62  Temp(Src) 97.6 F (36.4 C) (Oral)  Resp 18  SpO2 100% Physical Exam  Constitutional: He is oriented to person, place, and time. He appears well-developed and well-nourished.  HENT:  Head: Normocephalic and atraumatic.  Eyes: EOM are normal. Pupils are equal, round, and reactive to light.  Neck: Normal range of motion. Neck supple. No JVD present.  Cardiovascular: Normal rate and regular rhythm.  Exam reveals no gallop and no friction rub.   No murmur heard. Pulmonary/Chest: No respiratory distress. He has no wheezes.  Abdominal: He exhibits no distension. There is no rebound and no guarding.  Musculoskeletal: Normal range of motion.  Neurological: He is alert and oriented to person, place, and time. GCS eye subscore is 4. GCS verbal subscore is 4. GCS motor subscore is 6.  Skin: No rash noted. No pallor.  Psychiatric: He has a normal mood and affect. His behavior is normal.  Nursing note and vitals reviewed.   ED Course  Procedures (including critical care time) Labs Review Labs Reviewed  COMPREHENSIVE METABOLIC PANEL - Abnormal; Notable for the following:    CO2 20 (*)    Glucose, Bld 134 (*)    BUN 30 (*)    Creatinine, Ser 1.27 (*)    ALT 12 (*)    GFR calc non Af Amer 53 (*)    All other components within normal limits  URINALYSIS, ROUTINE W REFLEX MICROSCOPIC (NOT AT [facility]) - Abnormal; Notable for the following:    Bilirubin Urine SMALL (*)    Ketones, ur 15 (*)    All other  components within normal limits  CBC WITH DIFFERENTIAL/PLATELET    Imaging Review Dg Chest 2 View  12/08/2015  CLINICAL DATA:  Altered mental status, dementia EXAM: CHEST  2 VIEW COMPARISON:  12/02/2015 and multiple prior studies FINDINGS: Known aneurysm of the thoracic aorta not visualized particularly prominently on this examination. Vascular pattern is normal otherwise. The lungs are clear. There are no pleural effusions. IMPRESSION: No acute cardiopulmonary abnormality. Electronically Signed   By: Esperanza Heiraymond  Rubner M.D.   On: 12/08/2015 16:10   Ct Head Wo Contrast  12/08/2015  CLINICAL DATA:  Dementia and agitation EXAM: CT HEAD WITHOUT CONTRAST TECHNIQUE: Contiguous axial images were obtained from the base of the skull through the vertex without intravenous contrast. COMPARISON:  07/31/2015 FINDINGS: Bony calvarium is intact. Diffuse atrophic changes are noted. Chronic white matter ischemic change as well as prior right frontal infarct is noted. No  findings to suggest acute hemorrhage, acute infarction or space-occupying mass lesion are noted. IMPRESSION: Chronic atrophic and ischemic changes without acute abnormality. Electronically Signed   By: Alcide Clever M.D.   On: 12/08/2015 15:52   I have personally reviewed and evaluated these images and lab results as part of my medical decision-making.   EKG Interpretation None      MDM   Final diagnoses:  Dementia, without behavioral disturbance    80 yo M with ams.  Per nursing home started today.  Hx of dementia, unsure extent of change from baseline.  Obtain cbc, cmp, ct head, ua, cxr.  Patient pleasantly demented on exam.    Lab eval and ua negative, d/c home.   I have discussed the diagnosis/risks/treatment options with the patient and believe the pt to be eligible for discharge home to follow-up with PCP. We also discussed returning to the ED immediately if new or worsening sx occur. We discussed the sx which are most concerning (e.g.,  sudden worsening pain, fever, inability to tolerate by mouth ) that necessitate immediate return. Medications administered to the patient during their visit and any new prescriptions provided to the patient are listed below.  Medications given during this visit Medications - No data to display  New Prescriptions   No medications on file    The patient appears reasonably screen and/or stabilized for discharge and I doubt any other medical condition or other [facility] requiring further screening, evaluation, or treatment in the ED at this time prior to discharge.     Melene Plan, DO 12/08/15 2249

## 2015-12-08 NOTE — ED Notes (Signed)
[facility]  Report given to Rosette RevealMonique McKinney, RN.  F/U reviewed.  Caregiver verbalized understanding.

## 2015-12-08 NOTE — Discharge Instructions (Signed)
Dementia  Dementia is a word that is used to describe problems with the brain and how it works. People with dementia have memory loss. They may also have problems with thinking, speaking, or solving problems. It can affect how they act around people, how they do their job, their mood, and their personality. These changes may not show up for a long time. Family or friends may not notice problems in the early part of this disease.  HOME CARE  The following tips are for the person living with, or caring for, the person with dementia.  Make the home safe.  · Remove locks on bathroom doors.  · Use childproof locks on cabinets where alcohol, cleaning supplies, or chemicals are stored.  · Put outlet covers in electrical outlets.  · Put in childproof locks to keep doors and windows safe.  · Remove stove knobs, or put in safety knobs that shut off on their own.  · Lower the temperature on water heaters.  · Label medicines. Lock them in a safe place.  · Keep knives, lighters, matches, power tools, and guns out of reach or in a safe place.  · Remove objects that might break or can hurt the person.  · Make sure lighting is good inside and outside.  · Put in grab bars if needed.  · Use a device that detects falls or other needs for help.  Lessen confusion.  · Keep familiar objects and people around.  · Use night lights or low lit (dim) lights at night.  · Label objects or areas.  · Use reminders, notes, or directions for daily activities or tasks.  · Keep a simple routine that is the same for waking, meals, bathing, dressing, and bedtime.  · Create a calm and quiet home.  · Put up clocks and calendars.  · Keep emergency numbers and the home address near all phones.  · Help show the different times of day. Open the curtains during the day to let light in.  Speak clearly and directly.  · Choose simple words and short sentences.  · Use a gentle, calm voice.  · Do not interrupt.  · If the person has a hard time finding a word to  use, give them the word or thought.  · Ask 1 question at a time. Give enough time for the person to answer. Repeat the question if the person does not answer.  Do things that lessen restlessness.  · Provide a comfortable bed.  · Have the same bedtime routine every night.  · Have a regular walking and activity schedule.  · Lessen naps during the day.  · Do not let the person drink a lot of caffeine.  · Go to events that are not overwhelming.  Eat well and drink fluids.  · Lessen distractions during meal times and snacks.  · Avoid foods that are too hot or too cold.  · Watch how the person chews and swallows. This is to make sure they do not choke.  Other  · Keep all vision, hearing, dental, and medical visits with the doctor.  · Only give medicines as told by the doctor.  · Watch the person's driving ability. Do not let the person drive if he or she cannot drive safely.  · Use a program that helps find a person if they become missing. You may need to register with this program.  GET HELP RIGHT AWAY IF:   · A fever of 102° F (38.9° C)   develops.  · Confusion develops or gets worse.  · Sleepiness develops or gets worse.  · Staying awake is hard to do.  · New behavior problems start like mood swings, aggression, and seeing things that are not there.  · Problems with balance, speech, or falling develop.  · Problems swallowing develop.  · Any problems of another sickness develop.  MAKE SURE YOU:  · Understand these instructions.  · Will watch his or her condition.  · Will get help right away if he or she is not doing well or gets worse.     This information is not intended to replace advice given to you by your health care provider. Make sure you discuss any questions you have with your health care provider.     Document Released: 09/05/2008 Document Revised: 12/16/2011 Document Reviewed: 02/18/2011  Elsevier Interactive Patient Education ©2016 [employer]

## 2015-12-08 NOTE — ED Notes (Signed)
Attempted in and out cath, but would not pass into bladder.

## 2015-12-08 NOTE — ED Notes (Signed)
Bed: [id number] Expected date: 12/08/15 Expected time:  Means of arrival:  Comments: EMS

## 2015-12-08 NOTE — ED Notes (Signed)
In and out cath was unsuccessful.  

## 2015-12-08 NOTE — ED Notes (Signed)
[facility] called to arrange transportation.  

## 2015-12-09 NOTE — ED Notes (Signed)
Call placed to [facility], request to transport is still open per operator.  No time frame for arrival to transport patient.

## 2016-01-02 ENCOUNTER — Encounter (HOSPITAL_COMMUNITY): Payer: Self-pay | Admitting: Emergency Medicine

## 2016-01-02 ENCOUNTER — Emergency Department (HOSPITAL_COMMUNITY)
Admission: EM | Admit: 2016-01-02 | Discharge: 2016-01-03 | Disposition: A | Discharge status: Home / Self Care | Payer: Medicare Other | Attending: Emergency Medicine | Admitting: Emergency Medicine

## 2016-01-02 ENCOUNTER — Emergency Department (HOSPITAL_COMMUNITY): Payer: Medicare Other

## 2016-01-02 DIAGNOSIS — R451 Restlessness and agitation: Secondary | ICD-10-CM | POA: Diagnosis present

## 2016-01-02 DIAGNOSIS — Z7982 Long term (current) use of aspirin: Secondary | ICD-10-CM | POA: Insufficient documentation

## 2016-01-02 DIAGNOSIS — I10 Essential (primary) hypertension: Secondary | ICD-10-CM | POA: Insufficient documentation

## 2016-01-02 DIAGNOSIS — Z79899 Other long term (current) drug therapy: Secondary | ICD-10-CM | POA: Insufficient documentation

## 2016-01-02 DIAGNOSIS — F0390 Unspecified dementia without behavioral disturbance: Secondary | ICD-10-CM | POA: Insufficient documentation

## 2016-01-02 DIAGNOSIS — Z8673 Personal history of transient ischemic attack (TIA), and cerebral infarction without residual deficits: Secondary | ICD-10-CM | POA: Insufficient documentation

## 2016-01-02 LAB — CBC WITH DIFFERENTIAL/PLATELET
BASOS ABS: 0 10*3/uL (ref 0.0–0.1)
Basophils Relative: 1 %
EOS ABS: 0.1 10*3/uL (ref 0.0–0.7)
EOS PCT: 2 %
HCT: 39.2 % (ref 39.0–52.0)
Hemoglobin: 13.3 g/dL (ref 13.0–17.0)
LYMPHS ABS: 1.5 10*3/uL (ref 0.7–4.0)
Lymphocytes Relative: 25 %
MCH: 29.9 pg (ref 26.0–34.0)
MCHC: 33.9 g/dL (ref 30.0–36.0)
MCV: 88.1 fL (ref 78.0–100.0)
MONO ABS: 0.4 10*3/uL (ref 0.1–1.0)
Monocytes Relative: 6 %
Neutro Abs: 4 10*3/uL (ref 1.7–7.7)
Neutrophils Relative %: 66 %
PLATELETS: 192 10*3/uL (ref 150–400)
RBC: 4.45 MIL/uL (ref 4.22–5.81)
RDW: 13.3 % (ref 11.5–15.5)
WBC: 5.9 10*3/uL (ref 4.0–10.5)

## 2016-01-02 LAB — URINALYSIS, ROUTINE W REFLEX MICROSCOPIC
Bilirubin Urine: NEGATIVE
Glucose, UA: NEGATIVE mg/dL
Hgb urine dipstick: NEGATIVE
Ketones, ur: NEGATIVE mg/dL
LEUKOCYTES UA: NEGATIVE
NITRITE: NEGATIVE
PH: 8 (ref 5.0–8.0)
Protein, ur: NEGATIVE mg/dL
SPECIFIC GRAVITY, URINE: 1.007 (ref 1.005–1.030)

## 2016-01-02 LAB — COMPREHENSIVE METABOLIC PANEL
ALT: 13 U/L — ABNORMAL LOW (ref 17–63)
AST: 17 U/L (ref 15–41)
Albumin: 3.9 g/dL (ref 3.5–5.0)
Alkaline Phosphatase: 67 U/L (ref 38–126)
Anion gap: 9 (ref 5–15)
BUN: 21 mg/dL — ABNORMAL HIGH (ref 6–20)
CHLORIDE: 109 mmol/L (ref 101–111)
CO2: 23 mmol/L (ref 22–32)
CREATININE: 1.16 mg/dL (ref 0.61–1.24)
Calcium: 9.3 mg/dL (ref 8.9–10.3)
GFR, EST NON AFRICAN AMERICAN: 59 mL/min — AB (ref >60)
Glucose, Bld: 100 mg/dL — ABNORMAL HIGH (ref 65–99)
POTASSIUM: 4.1 mmol/L (ref 3.5–5.1)
SODIUM: 141 mmol/L (ref 135–145)
Total Bilirubin: 0.4 mg/dL (ref 0.3–1.2)
Total Protein: 6.1 g/dL — ABNORMAL LOW (ref 6.5–8.1)

## 2016-01-02 NOTE — ED Notes (Signed)
Per EMS, states was getting agitated 3 hours ago at nursing facility-was given ativan in which he responded well to-sent here for eval

## 2016-01-02 NOTE — ED Provider Notes (Signed)
CSN: [id number]     Arrival date & time 01/02/16  1614 History   First MD Initiated Contact with Patient 01/02/16 1636     Chief Complaint  Patient presents with  . Agitation      The history is provided by the patient, the nursing home and the EMS personnel. The history is limited by the condition of the patient (Hx dementia).  Pt was seen at 1655. Per EMS, NH report and pt: Pt was agitated 3 hours ago and was given his prn dose of ativan. Pt calmed after PO ativan. Pt was then sent to the ED for evaluation.   Past Medical History  Diagnosis Date  . Hypertension   . Stroke (HCC)   . Dementia   . Urinary retention 09/22/2014   Past Surgical History  Procedure Laterality Date  . Inguinal hernia repair N/A 06/20/2014    Procedure: LAPAROSCOPIC BILATERAL INGUINAL HERNIA REPAIR, LEFT FEMORAL AND INCARCERATED SUPRA UMBILICAL HERNIA REPAIR WITH MESH;  Surgeon: Karie Soda, MD;  Location: [facility];  Service: General;  Laterality: N/A;  . Insertion of mesh N/A 06/20/2014    Procedure: INSERTION OF MESH;  Surgeon: Karie Soda, MD;  Location: [facility];  Service: General;  Laterality: N/A;   Family History  Problem Relation Age of Onset  . Stroke Mother   . Hypertension Mother    Social History  Substance Use Topics  . Smoking status: Never Smoker   . Smokeless tobacco: Never Used  . Alcohol Use: No    Review of Systems  Unable to perform ROS: Dementia      Allergies  Review of patient's allergies indicates no known allergies.  Home Medications   Prior to Admission medications   Medication Sig Start Date End Date Taking? Authorizing Provider  acetaminophen (TYLENOL) 500 MG tablet Take 500 mg by mouth every 4 (four) hours as needed for mild pain or moderate pain (pain).    Yes Historical Provider, MD  alum & mag hydroxide-simeth (MAALOX PLUS) 400-400-40 MG/5ML suspension Take 30 mLs by mouth every 6 (six) hours as needed for indigestion.   Yes Historical Provider, MD   aluminum-magnesium hydroxide-simethicone (MAALOX) 200-200-20 MG/5ML SUSP Take 30 mLs by mouth every 6 (six) hours as needed (heartburn/indigestion). Standing order   Yes Historical Provider, MD  aspirin 81 MG chewable tablet Chew 81 mg by mouth daily.   Yes Historical Provider, MD  cetirizine (ZYRTEC) 10 MG tablet Take 10 mg by mouth daily as needed (itching).   Yes Historical Provider, MD  cholecalciferol (VITAMIN D) 1000 UNITS tablet Take 1,000 Units by mouth daily.   Yes Historical Provider, MD  divalproex (DEPAKOTE SPRINKLE) 125 MG capsule Take 250 mg by mouth 2 (two) times daily.    Yes Historical Provider, MD  guaifenesin (ROBITUSSIN) 100 MG/5ML syrup Take 200 mg by mouth every 6 (six) hours as needed for cough.    Yes Historical Provider, MD  loperamide (IMODIUM) 2 MG capsule Take 2 mg by mouth as needed for diarrhea or loose stools.    Yes Historical Provider, MD  LORazepam (ATIVAN) 0.5 MG tablet Take 0.5 mg by mouth every 8 (eight) hours as needed for anxiety (agitation).   Yes Historical Provider, MD  magnesium hydroxide (MILK OF MAGNESIA) 400 MG/5ML suspension Take 30 mLs by mouth at bedtime as needed for mild constipation. Reported on 01/02/2016   Yes Historical Provider, MD  memantine (NAMENDA XR) 7 MG CP24 24 hr capsule Take 7 mg by mouth daily.  Yes Historical Provider, MD  mirtazapine (REMERON) 30 MG tablet Take 30 mg by mouth at bedtime.   Yes Historical Provider, MD  neomycin-bacitracin-polymyxin (NEOSPORIN) [phone] ointment Apply 1 application topically daily as needed (for skin tears,abrasions,minor irritation).    Yes Historical Provider, MD  Nutritional Supplements (GLUCERNA SHAKE PO) Take 1 each by mouth 3 (three) times daily. Mighty Shakes   Yes Historical Provider, MD  Skin Protectants, Misc. (MINERIN) CREA Apply 1 application topically 2 (two) times daily. To entire body   Yes Historical Provider, MD  tamsulosin (FLOMAX) 0.4 MG CAPS capsule Take 1 capsule (0.4 mg total)  by mouth daily. 09/27/14  Yes Brittainy Sherlynn CarbonM Simmons, PA-C  traMADol (ULTRAM) 50 MG tablet Take 100 mg by mouth every 6 (six) hours as needed for moderate pain or severe pain (pain). 06/20/14  Yes Karie SodaSteven Gross, MD   BP 129/71 mmHg  Pulse 59  Temp(Src) 97.9 F (36.6 C) (Oral)  Resp 12  SpO2 100% Physical Exam  1700: Physical examination:  Nursing notes reviewed; Vital signs and O2 SAT reviewed;  Constitutional: Well developed, Well nourished, Well hydrated, In no acute distress; Head:  Normocephalic, atraumatic; Eyes: EOMI, PERRL, No scleral icterus; ENMT: Mouth and pharynx normal, Mucous membranes moist; Neck: Supple, Full range of motion, No lymphadenopathy; Cardiovascular: Regular rate and rhythm, No gallop; Respiratory: Breath sounds clear & equal bilaterally, No wheezes.  Speaking full sentences with ease, Normal respiratory effort/excursion; Chest: Nontender, Movement normal; Abdomen: Soft, Nontender, Nondistended, Normal bowel sounds; Genitourinary: No CVA tenderness; Extremities: Pulses normal, No tenderness, No edema, No calf edema or asymmetry.; Neuro: Awake, alert, confused per hx dementia. No facial droop. Speech clear. Moves all extremities spontaneously without apparent gross focal motor deficits.; Skin: Color normal, Warm, Dry.; Psych:  Calm, cooperative.   ED Course  Procedures (including critical care time) Labs Review  Imaging Review  I have personally reviewed and evaluated these images and lab results as part of my medical decision-making.   EKG Interpretation None      MDM  MDM Reviewed: previous chart, nursing note and vitals Reviewed previous: labs, CT scan and x-ray Interpretation: labs and x-ray     Results for orders placed or performed during the hospital encounter of 01/02/16  CBC with Differential  Result Value Ref Range   WBC 5.9 4.0 - 10.5 K/uL   RBC 4.45 4.22 - 5.81 MIL/uL   Hemoglobin 13.3 13.0 - 17.0 g/dL   HCT 16.139.2 09.639.0 - 04.552.0 %   MCV 88.1 78.0  - 100.0 fL   MCH 29.9 26.0 - 34.0 pg   MCHC 33.9 30.0 - 36.0 g/dL   RDW 40.913.3 81.111.5 - 91.415.5 %   Platelets 192 150 - 400 K/uL   Neutrophils Relative % 66 %   Neutro Abs 4.0 1.7 - 7.7 K/uL   Lymphocytes Relative 25 %   Lymphs Abs 1.5 0.7 - 4.0 K/uL   Monocytes Relative 6 %   Monocytes Absolute 0.4 0.1 - 1.0 K/uL   Eosinophils Relative 2 %   Eosinophils Absolute 0.1 0.0 - 0.7 K/uL   Basophils Relative 1 %   Basophils Absolute 0.0 0.0 - 0.1 K/uL  Comprehensive metabolic panel  Result Value Ref Range   Sodium 141 135 - 145 mmol/L   Potassium 4.1 3.5 - 5.1 mmol/L   Chloride 109 101 - 111 mmol/L   CO2 23 22 - 32 mmol/L   Glucose, Bld 100 (H) 65 - 99 mg/dL   BUN 21 (H) 6 -  20 mg/dL   Creatinine, Ser 4.09 0.61 - 1.24 mg/dL   Calcium 9.3 8.9 - 81.1 mg/dL   Total Protein 6.1 (L) 6.5 - 8.1 g/dL   Albumin 3.9 3.5 - 5.0 g/dL   AST 17 15 - 41 U/L   ALT 13 (L) 17 - 63 U/L   Alkaline Phosphatase 67 38 - 126 U/L   Total Bilirubin 0.4 0.3 - 1.2 mg/dL   GFR calc non Af Amer 59 (L) >60 mL/min   GFR calc Af Amer >60 >60 mL/min   Anion gap 9 5 - 15  Urinalysis, Routine w reflex microscopic  Result Value Ref Range   Color, Urine YELLOW YELLOW   APPearance CLEAR CLEAR   Specific Gravity, Urine 1.007 1.005 - 1.030   pH 8.0 5.0 - 8.0   Glucose, UA NEGATIVE NEGATIVE mg/dL   Hgb urine dipstick NEGATIVE NEGATIVE   Bilirubin Urine NEGATIVE NEGATIVE   Ketones, ur NEGATIVE NEGATIVE mg/dL   Protein, ur NEGATIVE NEGATIVE mg/dL   Nitrite NEGATIVE NEGATIVE   Leukocytes, UA NEGATIVE NEGATIVE   Dg Chest 2 View 01/02/2016  CLINICAL DATA:  Altered mental status EXAM: CHEST  2 VIEW COMPARISON:  12/08/2015 FINDINGS: The heart size and mediastinal contours are within normal limits. Both lungs are clear. The visualized skeletal structures are unremarkable. Stable mild aortic arch calcification. IMPRESSION: No active cardiopulmonary disease. Electronically Signed   By: Esperanza Heir M.D.   On: 01/02/2016 17:36     2055:  Pt remains calm/cooperative while in the ED. Workup reassuring. Will d/c back to NH.     Samuel Jester, DO 01/05/16 1600

## 2016-01-02 NOTE — ED Notes (Signed)
[facility] notified for transfer; [facility] no [facility]. Pt a/o to his norm, no distress noted

## 2016-01-02 NOTE — Discharge Instructions (Signed)
Take your usual prescriptions as previously directed.  Call your regular medical doctor tomorrow to schedule a follow up appointment within the next 2 days.  Return to the Emergency Department immediately sooner if worsening.  ° °

## 2016-01-02 NOTE — ED Notes (Signed)
Bed: WA07 Expected date:  Expected time:  Means of arrival:  Comments: [id number] Confused

## 2016-01-04 LAB — URINE CULTURE: Culture: NO GROWTH

## 2016-01-23 ENCOUNTER — Encounter (HOSPITAL_COMMUNITY): Payer: Self-pay | Admitting: Emergency Medicine

## 2016-01-23 ENCOUNTER — Emergency Department (HOSPITAL_COMMUNITY)
Admission: EM | Admit: 2016-01-23 | Discharge: 2016-01-23 | Disposition: A | Discharge status: Skilled Nursing Facility | Payer: Medicare Other | Attending: Emergency Medicine | Admitting: Emergency Medicine

## 2016-01-23 DIAGNOSIS — F03918 Unspecified dementia, unspecified severity, with other behavioral disturbance: Secondary | ICD-10-CM

## 2016-01-23 DIAGNOSIS — I10 Essential (primary) hypertension: Secondary | ICD-10-CM | POA: Insufficient documentation

## 2016-01-23 DIAGNOSIS — I639 Cerebral infarction, unspecified: Secondary | ICD-10-CM | POA: Diagnosis not present

## 2016-01-23 DIAGNOSIS — Z791 Long term (current) use of non-steroidal anti-inflammatories (NSAID): Secondary | ICD-10-CM | POA: Insufficient documentation

## 2016-01-23 DIAGNOSIS — Z9889 Other specified postprocedural states: Secondary | ICD-10-CM | POA: Diagnosis not present

## 2016-01-23 DIAGNOSIS — Z792 Long term (current) use of antibiotics: Secondary | ICD-10-CM | POA: Insufficient documentation

## 2016-01-23 DIAGNOSIS — Z7982 Long term (current) use of aspirin: Secondary | ICD-10-CM | POA: Insufficient documentation

## 2016-01-23 DIAGNOSIS — F0391 Unspecified dementia with behavioral disturbance: Secondary | ICD-10-CM | POA: Diagnosis not present

## 2016-01-23 DIAGNOSIS — R4182 Altered mental status, unspecified: Secondary | ICD-10-CM | POA: Diagnosis present

## 2016-01-23 LAB — COMPREHENSIVE METABOLIC PANEL
ALK PHOS: 61 U/L (ref 38–126)
ALT: 11 U/L — ABNORMAL LOW (ref 17–63)
ANION GAP: 8 (ref 5–15)
AST: 20 U/L (ref 15–41)
Albumin: 3.6 g/dL (ref 3.5–5.0)
BUN: 22 mg/dL — ABNORMAL HIGH (ref 6–20)
CALCIUM: 8.7 mg/dL — AB (ref 8.9–10.3)
CO2: 25 mmol/L (ref 22–32)
Chloride: 106 mmol/L (ref 101–111)
Creatinine, Ser: 1.3 mg/dL — ABNORMAL HIGH (ref 0.61–1.24)
GFR, EST AFRICAN AMERICAN: 59 mL/min — AB (ref >60)
GFR, EST NON AFRICAN AMERICAN: 51 mL/min — AB (ref >60)
Glucose, Bld: 122 mg/dL — ABNORMAL HIGH (ref 65–99)
Potassium: 4.2 mmol/L (ref 3.5–5.1)
SODIUM: 139 mmol/L (ref 135–145)
TOTAL PROTEIN: 6 g/dL — AB (ref 6.5–8.1)
Total Bilirubin: 0.7 mg/dL (ref 0.3–1.2)

## 2016-01-23 LAB — CBC WITH DIFFERENTIAL/PLATELET
Basophils Absolute: 0 10*3/uL (ref 0.0–0.1)
Basophils Relative: 1 %
EOS ABS: 0.1 10*3/uL (ref 0.0–0.7)
EOS PCT: 2 %
HCT: 40 % (ref 39.0–52.0)
HEMOGLOBIN: 13.7 g/dL (ref 13.0–17.0)
LYMPHS ABS: 1 10*3/uL (ref 0.7–4.0)
Lymphocytes Relative: 17 %
MCH: 30.8 pg (ref 26.0–34.0)
MCHC: 34.3 g/dL (ref 30.0–36.0)
MCV: 89.9 fL (ref 78.0–100.0)
MONOS PCT: 9 %
Monocytes Absolute: 0.5 10*3/uL (ref 0.1–1.0)
Neutro Abs: 4 10*3/uL (ref 1.7–7.7)
Neutrophils Relative %: 71 %
Platelets: 180 10*3/uL (ref 150–400)
RBC: 4.45 MIL/uL (ref 4.22–5.81)
RDW: 13.6 % (ref 11.5–15.5)
WBC: 5.6 10*3/uL (ref 4.0–10.5)

## 2016-01-23 NOTE — ED Notes (Signed)
Attempted to I&O cath pt  Unable to cath pt with #16 Fr so attempted with a #16 Coude  No urine noted on return  Pt tolerated poorly

## 2016-01-23 NOTE — ED Notes (Signed)
During In-and-Out Cath attempt pt was continuously bearing down; no urine was obtained

## 2016-01-23 NOTE — ED Provider Notes (Signed)
CSN: [id number]     Arrival date & time 01/23/16  1714 History   First MD Initiated Contact with Patient 01/23/16 1724     Chief Complaint  Patient presents with  . Altered Mental Status  . Aggressive Behavior     (Consider location/radiation/quality/duration/timing/severity/associated sxs/prior Treatment) HPI Patient with history of dementia presents from nursing home with uncooperative and combative behavior. Per EMS patient was kicking and punching staff. He refused to take his morning medication. Was given Haldol and Versed en route. Patient was then placed on oxygen. Drowsy but can answer simple questions. He denies any pain at this time.  Past Medical History  Diagnosis Date  . Hypertension   . Stroke (HCC)   . Dementia   . Urinary retention 09/22/2014   Past Surgical History  Procedure Laterality Date  . Inguinal hernia repair N/A 06/20/2014    Procedure: LAPAROSCOPIC BILATERAL INGUINAL HERNIA REPAIR, LEFT FEMORAL AND INCARCERATED SUPRA UMBILICAL HERNIA REPAIR WITH MESH;  Surgeon: Karie Soda, MD;  Location: [facility];  Service: General;  Laterality: N/A;  . Insertion of mesh N/A 06/20/2014    Procedure: INSERTION OF MESH;  Surgeon: Karie Soda, MD;  Location: [facility];  Service: General;  Laterality: N/A;   Family History  Problem Relation Age of Onset  . Stroke Mother   . Hypertension Mother    Social History  Substance Use Topics  . Smoking status: Never Smoker   . Smokeless tobacco: Never Used  . Alcohol Use: No    Review of Systems  Unable to perform ROS: Dementia  Cardiovascular: Negative for chest pain.  Psychiatric/Behavioral: Positive for agitation.      Allergies  Review of patient's allergies indicates no known allergies.  Home Medications   Prior to Admission medications   Medication Sig Start Date End Date Taking? Authorizing Provider  acetaminophen (TYLENOL) 500 MG tablet Take 500 mg by mouth every 4 (four) hours as needed for mild pain or  moderate pain (pain).    Yes Historical Provider, MD  alum & mag hydroxide-simeth (MAALOX PLUS) 400-400-40 MG/5ML suspension Take 30 mLs by mouth every 6 (six) hours as needed for indigestion.   Yes Historical Provider, MD  aluminum-magnesium hydroxide-simethicone (MAALOX) 200-200-20 MG/5ML SUSP Take 30 mLs by mouth every 6 (six) hours as needed (heartburn/indigestion). Standing order   Yes Historical Provider, MD  aspirin 81 MG chewable tablet Chew 81 mg by mouth daily.   Yes Historical Provider, MD  cetirizine (ZYRTEC) 10 MG tablet Take 10 mg by mouth daily as needed (itching).   Yes Historical Provider, MD  cholecalciferol (VITAMIN D) 1000 UNITS tablet Take 1,000 Units by mouth daily.   Yes Historical Provider, MD  divalproex (DEPAKOTE SPRINKLE) 125 MG capsule Take 250 mg by mouth 2 (two) times daily.    Yes Historical Provider, MD  guaifenesin (ROBITUSSIN) 100 MG/5ML syrup Take 200 mg by mouth every 6 (six) hours as needed for cough.    Yes Historical Provider, MD  loperamide (IMODIUM) 2 MG capsule Take 2 mg by mouth as needed for diarrhea or loose stools.    Yes Historical Provider, MD  LORazepam (ATIVAN) 0.5 MG tablet Take 0.5 mg by mouth every 8 (eight) hours. Take 0.5mg s daily at 8am, 2pm,and 8pm. May take 0.5mg s every 8 hours as needed for anxiety   Yes Historical Provider, MD  magnesium hydroxide (MILK OF MAGNESIA) 400 MG/5ML suspension Take 30 mLs by mouth at bedtime as needed for mild constipation. Reported on 01/02/2016   Yes  Historical Provider, MD  memantine (NAMENDA XR) 7 MG CP24 24 hr capsule Take 7 mg by mouth daily.   Yes Historical Provider, MD  mirtazapine (REMERON) 30 MG tablet Take 30 mg by mouth at bedtime.   Yes Historical Provider, MD  neomycin-bacitracin-polymyxin (NEOSPORIN) [phone] ointment Apply 1 application topically daily as needed (for skin tears,abrasions,minor irritation).    Yes Historical Provider, MD  Nutritional Supplements (GLUCERNA SHAKE PO) Take 1 each by  mouth 3 (three) times daily. Mighty Shakes   Yes Historical Provider, MD  tamsulosin (FLOMAX) 0.4 MG CAPS capsule Take 1 capsule (0.4 mg total) by mouth daily. 09/27/14  Yes Brittainy Sherlynn CarbonM Simmons, PA-C  traMADol (ULTRAM) 50 MG tablet Take 100 mg by mouth every 6 (six) hours as needed for moderate pain or severe pain (pain). 06/20/14  Yes Karie SodaSteven Gross, MD   BP 132/80 mmHg  Pulse 60  Temp(Src) 97.6 F (36.4 C) (Axillary)  Resp 18  SpO2 95% Physical Exam  Constitutional: He appears well-developed and well-nourished. No distress.  Drowsy but opens eyes to voice.  HENT:  Head: Normocephalic and atraumatic.  Mouth/Throat: Oropharynx is clear and moist. No oropharyngeal exudate.  Eyes: EOM are normal. Pupils are equal, round, and reactive to light.  Pinpoint bilateral pupils  Neck: Normal range of motion. Neck supple.  No meningismus  Cardiovascular: Normal rate and regular rhythm.  Exam reveals no gallop and no friction rub.   No murmur heard. Pulmonary/Chest: Effort normal and breath sounds normal. No respiratory distress. He has no wheezes. He has no rales. He exhibits no tenderness.  Abdominal: Soft. Bowel sounds are normal. He exhibits no distension and no mass. There is no tenderness. There is no rebound and no guarding.  Musculoskeletal: Normal range of motion. He exhibits no edema or tenderness.  No lower extremity swelling or asymmetry. Distal pulses are equal and intact.  Neurological:  Oriented to person. Moving all extremities without deficit. Following simple commands. Sensation is grossly intact.  Skin: Skin is warm and dry. No rash noted. No erythema.  Psychiatric:  Calm, cooperative and mildly sedated  Nursing note and vitals reviewed.   ED Course  Procedures (including critical care time) Labs Review Labs Reviewed  COMPREHENSIVE METABOLIC PANEL - Abnormal; Notable for the following:    Glucose, Bld 122 (*)    BUN 22 (*)    Creatinine, Ser 1.30 (*)    Calcium 8.7 (*)     Total Protein 6.0 (*)    ALT 11 (*)    GFR calc non Af Amer 51 (*)    GFR calc Af Amer 59 (*)    All other components within normal limits  CBC WITH DIFFERENTIAL/PLATELET  URINALYSIS, ROUTINE W REFLEX MICROSCOPIC (NOT AT [facility])    Imaging Review No results found. I have personally reviewed and evaluated these images and lab results as part of my medical decision-making.   EKG Interpretation None      MDM   Final diagnoses:  Dementia with aggressive behavior    Difficulty obtaining urine due to enlarged prostate. Will re-attempt with coude cath.   Patient is confused but is not combative or agitated. This appears to be his baseline. Unable to obtain urine patient urinated prior to coud cath attempt. Patient has normal white blood cell count and his been afebrile. His vital signs remained normal. Believe the patient can be discharged back to nursing home facility and urine can be obtained at that point.  Loren Raceravid Kanae Ignatowski, MD 01/23/16 [phone]

## 2016-01-23 NOTE — ED Notes (Signed)
Was not able to pass past the prostate during the in and out to get a urine sample. No sample was obtained RN made aware provider made aware

## 2016-01-23 NOTE — ED Notes (Signed)
Bed: [id number] Expected date:  Expected time:  Means of arrival:  Comments: 80 yo combative, haldol versed given

## 2016-01-23 NOTE — ED Notes (Signed)
Patient was alert, and stable upon discharge. RN went over AVS and patient had no further questions.  

## 2016-01-23 NOTE — ED Notes (Signed)
[facility] called for transport back to facility 

## 2016-01-23 NOTE — ED Notes (Signed)
Pt found standing at the foot of bed incontinent of large amt of urine  Pt cleaned and assisted back to bed  Tolerated well

## 2016-01-23 NOTE — ED Notes (Signed)
[facility] for altered mental status and combative. Received 5 mg versed and 2.5 Haldol IM in route. Nursing home says patient baseline is confused but happy. Pt refused all meds this morning and was very violent.    Vitals  cbg 134 113/71 bp 77 hr 98% 2L

## 2016-01-23 NOTE — Discharge Instructions (Signed)

## 2016-01-23 NOTE — ED Notes (Signed)
Fluids provided to pt. 

## 2016-01-31 ENCOUNTER — Emergency Department (HOSPITAL_COMMUNITY)
Admission: EM | Admit: 2016-01-31 | Discharge: 2016-02-01 | Disposition: A | Discharge status: Home / Self Care | Payer: Medicare Other | Attending: Emergency Medicine | Admitting: Emergency Medicine

## 2016-01-31 ENCOUNTER — Encounter (HOSPITAL_COMMUNITY): Payer: Self-pay

## 2016-01-31 DIAGNOSIS — Z79899 Other long term (current) drug therapy: Secondary | ICD-10-CM | POA: Insufficient documentation

## 2016-01-31 DIAGNOSIS — F919 Conduct disorder, unspecified: Secondary | ICD-10-CM | POA: Insufficient documentation

## 2016-01-31 DIAGNOSIS — F0391 Unspecified dementia with behavioral disturbance: Secondary | ICD-10-CM | POA: Diagnosis not present

## 2016-01-31 DIAGNOSIS — R4182 Altered mental status, unspecified: Secondary | ICD-10-CM | POA: Diagnosis present

## 2016-01-31 DIAGNOSIS — Z7982 Long term (current) use of aspirin: Secondary | ICD-10-CM | POA: Diagnosis not present

## 2016-01-31 DIAGNOSIS — N400 Enlarged prostate without lower urinary tract symptoms: Secondary | ICD-10-CM | POA: Insufficient documentation

## 2016-01-31 DIAGNOSIS — Z8673 Personal history of transient ischemic attack (TIA), and cerebral infarction without residual deficits: Secondary | ICD-10-CM | POA: Diagnosis not present

## 2016-01-31 DIAGNOSIS — R451 Restlessness and agitation: Secondary | ICD-10-CM | POA: Diagnosis not present

## 2016-01-31 DIAGNOSIS — I10 Essential (primary) hypertension: Secondary | ICD-10-CM | POA: Diagnosis not present

## 2016-01-31 LAB — BASIC METABOLIC PANEL
ANION GAP: 9 (ref 5–15)
BUN: 23 mg/dL — ABNORMAL HIGH (ref 6–20)
CALCIUM: 8.9 mg/dL (ref 8.9–10.3)
CO2: 22 mmol/L (ref 22–32)
CREATININE: 1.35 mg/dL — AB (ref 0.61–1.24)
Chloride: 108 mmol/L (ref 101–111)
GFR, EST AFRICAN AMERICAN: 57 mL/min — AB (ref >60)
GFR, EST NON AFRICAN AMERICAN: 49 mL/min — AB (ref >60)
Glucose, Bld: 104 mg/dL — ABNORMAL HIGH (ref 65–99)
Potassium: 3.9 mmol/L (ref 3.5–5.1)
SODIUM: 139 mmol/L (ref 135–145)

## 2016-01-31 LAB — CBC WITH DIFFERENTIAL/PLATELET
BASOS ABS: 0 10*3/uL (ref 0.0–0.1)
BASOS PCT: 0 %
EOS ABS: 0.1 10*3/uL (ref 0.0–0.7)
Eosinophils Relative: 1 %
HCT: 40.9 % (ref 39.0–52.0)
HEMOGLOBIN: 14 g/dL (ref 13.0–17.0)
Lymphocytes Relative: 16 %
Lymphs Abs: 1.4 10*3/uL (ref 0.7–4.0)
MCH: 30.5 pg (ref 26.0–34.0)
MCHC: 34.2 g/dL (ref 30.0–36.0)
MCV: 89.1 fL (ref 78.0–100.0)
Monocytes Absolute: 0.7 10*3/uL (ref 0.1–1.0)
Monocytes Relative: 9 %
NEUTROS PCT: 74 %
Neutro Abs: 6.4 10*3/uL (ref 1.7–7.7)
Platelets: 175 10*3/uL (ref 150–400)
RBC: 4.59 MIL/uL (ref 4.22–5.81)
RDW: 13.5 % (ref 11.5–15.5)
WBC: 8.6 10*3/uL (ref 4.0–10.5)

## 2016-01-31 MED ORDER — LORAZEPAM 1 MG PO TABS
1.0000 mg | ORAL_TABLET | Freq: Once | ORAL | Status: DC
  Filled 2016-01-31: qty 1

## 2016-01-31 NOTE — ED Notes (Signed)
[facility] called  

## 2016-01-31 NOTE — ED Notes (Signed)
[facility]. Per facilty, pt is being more aggressive than usual. They report that pt was last seen normal at 11p last night. EMS reports no aggressive behavior. EMS reports that pt is at baseline at this time. Pt is calm and cooperative during triage. Denies pain, no complaints at this time. Pt is ambulatory and is able to answer questions.   EMS vitals 128/78, 100, CBG 125

## 2016-01-31 NOTE — Discharge Instructions (Signed)

## 2016-01-31 NOTE — ED Notes (Signed)
[facility] Expected date:  Expected time:  Means of arrival:  Comments: EMS- 80yo M, aggressive behavior

## 2016-01-31 NOTE — ED Notes (Signed)
Pt wet his bed. Pt was able to ambulate to the restroom to get changed.

## 2016-01-31 NOTE — ED Provider Notes (Signed)
CSN: [id number]     Arrival date & time 01/31/16  1913 History   First MD Initiated Contact with Patient 01/31/16 2020     Chief Complaint  Patient presents with  . Altered Mental Status     (Consider location/radiation/quality/duration/timing/severity/associated sxs/prior Treatment) HPI Comments: Patient history of hypertension, BPH and dementia presents with aggressive behavior. He lives in a nursing facility. Per the nursing staff and EMS report he's had aggressive behavior this evening.  The staff this had difficulty controlling his behavior. Per EMS he has been calm and cooperative in route. He is been able to answer questions.  History is limited due to his dementia.  Patient is a 80 y.o. male presenting with altered mental status.  Altered Mental Status   Past Medical History  Diagnosis Date  . Hypertension   . Stroke (HCC)   . Dementia   . Urinary retention 09/22/2014   Past Surgical History  Procedure Laterality Date  . Inguinal hernia repair N/A 06/20/2014    Procedure: LAPAROSCOPIC BILATERAL INGUINAL HERNIA REPAIR, LEFT FEMORAL AND INCARCERATED SUPRA UMBILICAL HERNIA REPAIR WITH MESH;  Surgeon: Karie SodaSteven Gross, MD;  Location: [facility];  Service: General;  Laterality: N/A;  . Insertion of mesh N/A 06/20/2014    Procedure: INSERTION OF MESH;  Surgeon: Karie SodaSteven Gross, MD;  Location: [facility];  Service: General;  Laterality: N/A;   Family History  Problem Relation Age of Onset  . Stroke Mother   . Hypertension Mother    Social History  Substance Use Topics  . Smoking status: Never Smoker   . Smokeless tobacco: Never Used  . Alcohol Use: No    Review of Systems  Unable to perform ROS: Dementia      Allergies  Review of patient's allergies indicates no known allergies.  Home Medications   Prior to Admission medications   Medication Sig Start Date End Date Taking? Authorizing Provider  acetaminophen (TYLENOL) 500 MG tablet Take 500 mg by mouth every 4 (four) hours as  needed for mild pain or moderate pain (pain).     Historical Provider, MD  alum & mag hydroxide-simeth (MAALOX PLUS) 400-400-40 MG/5ML suspension Take 30 mLs by mouth every 6 (six) hours as needed for indigestion.    Historical Provider, MD  aluminum-magnesium hydroxide-simethicone (MAALOX) 200-200-20 MG/5ML SUSP Take 30 mLs by mouth every 6 (six) hours as needed (heartburn/indigestion). Standing order    Historical Provider, MD  aspirin 81 MG chewable tablet Chew 81 mg by mouth daily.    Historical Provider, MD  cetirizine (ZYRTEC) 10 MG tablet Take 10 mg by mouth daily as needed (itching).    Historical Provider, MD  cholecalciferol (VITAMIN D) 1000 UNITS tablet Take 1,000 Units by mouth daily.    Historical Provider, MD  divalproex (DEPAKOTE SPRINKLE) 125 MG capsule Take 250 mg by mouth 2 (two) times daily.     Historical Provider, MD  guaifenesin (ROBITUSSIN) 100 MG/5ML syrup Take 200 mg by mouth every 6 (six) hours as needed for cough.     Historical Provider, MD  loperamide (IMODIUM) 2 MG capsule Take 2 mg by mouth as needed for diarrhea or loose stools.     Historical Provider, MD  LORazepam (ATIVAN) 0.5 MG tablet Take 0.5 mg by mouth every 8 (eight) hours. Take 0.5mg s daily at 8am, 2pm,and 8pm. May take 0.5mg s every 8 hours as needed for anxiety    Historical Provider, MD  magnesium hydroxide (MILK OF MAGNESIA) 400 MG/5ML suspension Take 30 mLs by mouth at  bedtime as needed for mild constipation. Reported on 01/02/2016    Historical Provider, MD  memantine (NAMENDA XR) 7 MG CP24 24 hr capsule Take 7 mg by mouth daily.    Historical Provider, MD  mirtazapine (REMERON) 30 MG tablet Take 30 mg by mouth at bedtime.    Historical Provider, MD  neomycin-bacitracin-polymyxin (NEOSPORIN) [phone] ointment Apply 1 application topically daily as needed (for skin tears,abrasions,minor irritation).     Historical Provider, MD  Nutritional Supplements (GLUCERNA SHAKE PO) Take 1 each by mouth 3 (three)  times daily. Mighty Shakes    Historical Provider, MD  tamsulosin (FLOMAX) 0.4 MG CAPS capsule Take 1 capsule (0.4 mg total) by mouth daily. 09/27/14   Brittainy Sherlynn Carbon, PA-C  traMADol (ULTRAM) 50 MG tablet Take 100 mg by mouth every 6 (six) hours as needed for moderate pain or severe pain (pain). 06/20/14   Karie Soda, MD   BP 117/67 mmHg  Pulse 70  Temp(Src) 98.7 F (37.1 C) (Oral)  Resp 16  SpO2 96% Physical Exam  Constitutional: He appears well-developed and well-nourished.  HENT:  Head: Normocephalic and atraumatic.  Eyes: Pupils are equal, round, and reactive to light.  Neck: Normal range of motion. Neck supple.  Cardiovascular: Normal rate, regular rhythm and normal heart sounds.   Pulmonary/Chest: Effort normal and breath sounds normal. No respiratory distress. He has no wheezes. He has no rales. He exhibits no tenderness.  Abdominal: Soft. Bowel sounds are normal. There is no tenderness. There is no rebound and no guarding.  Musculoskeletal: Normal range of motion. He exhibits no edema.  Lymphadenopathy:    He has no cervical adenopathy.  Neurological: He is alert.  Patient is alert to person and place but confused to date he moves all extremities symmetrically without focal deficits  Skin: Skin is warm and dry. No rash noted.  Psychiatric: He has a normal mood and affect.    ED Course  Procedures (including critical care time) Labs Review Labs Reviewed  BASIC METABOLIC PANEL - Abnormal; Notable for the following:    Glucose, Bld 104 (*)    BUN 23 (*)    Creatinine, Ser 1.35 (*)    GFR calc non Af Amer 49 (*)    GFR calc Af Amer 57 (*)    All other components within normal limits  CBC WITH DIFFERENTIAL/PLATELET  URINALYSIS, ROUTINE W REFLEX MICROSCOPIC (NOT AT [facility])    Imaging Review No results found. I have personally reviewed and evaluated these images and lab results as part of my medical decision-making.   EKG Interpretation None      MDM   Final  diagnoses:  Agitation  Dementia, with behavioral disturbance    Patient presents with agitation. He's been calm and cooperative in the ED. He is starting to pace the halls little bit. He has not had any abnormal outbursts. There is no fever. He has no abdominal pain on exam. There is no suggestions of infection. His labs are unremarkable. His creatinine is mildly elevated which it has been over the last few months. We were not able to collect a urinalysis in the ED. He doesn't have any fever or other suggestions of a UTI at this point. He's been seen here twice in the last month for similar symptoms. At one of the visits he did have a normal urinalysis. He was discharged back to the nursing facility.    Rolan Bucco, MD 01/31/16 2211

## 2016-02-01 NOTE — ED Notes (Signed)
[facility] arrived.  

## 2016-02-15 ENCOUNTER — Encounter (HOSPITAL_COMMUNITY): Payer: Self-pay | Admitting: *Deleted

## 2016-02-15 ENCOUNTER — Emergency Department (HOSPITAL_COMMUNITY)
Admission: EM | Admit: 2016-02-15 | Discharge: 2016-02-15 | Disposition: A | Discharge status: Home / Self Care | Payer: Medicare Other | Attending: Emergency Medicine | Admitting: Emergency Medicine

## 2016-02-15 ENCOUNTER — Emergency Department (HOSPITAL_COMMUNITY): Payer: Medicare Other

## 2016-02-15 DIAGNOSIS — F0390 Unspecified dementia without behavioral disturbance: Secondary | ICD-10-CM | POA: Insufficient documentation

## 2016-02-15 DIAGNOSIS — Z8673 Personal history of transient ischemic attack (TIA), and cerebral infarction without residual deficits: Secondary | ICD-10-CM | POA: Insufficient documentation

## 2016-02-15 DIAGNOSIS — N390 Urinary tract infection, site not specified: Secondary | ICD-10-CM

## 2016-02-15 DIAGNOSIS — N471 Phimosis: Secondary | ICD-10-CM | POA: Diagnosis not present

## 2016-02-15 DIAGNOSIS — Z7982 Long term (current) use of aspirin: Secondary | ICD-10-CM | POA: Insufficient documentation

## 2016-02-15 DIAGNOSIS — E876 Hypokalemia: Secondary | ICD-10-CM | POA: Insufficient documentation

## 2016-02-15 DIAGNOSIS — Z79899 Other long term (current) drug therapy: Secondary | ICD-10-CM | POA: Diagnosis not present

## 2016-02-15 DIAGNOSIS — R8290 Unspecified abnormal findings in urine: Secondary | ICD-10-CM | POA: Diagnosis present

## 2016-02-15 DIAGNOSIS — R0602 Shortness of breath: Secondary | ICD-10-CM | POA: Insufficient documentation

## 2016-02-15 DIAGNOSIS — I10 Essential (primary) hypertension: Secondary | ICD-10-CM | POA: Diagnosis not present

## 2016-02-15 LAB — BASIC METABOLIC PANEL
ANION GAP: 8 (ref 5–15)
BUN: 19 mg/dL (ref 6–20)
CALCIUM: 9.2 mg/dL (ref 8.9–10.3)
CO2: 22 mmol/L (ref 22–32)
Chloride: 112 mmol/L — ABNORMAL HIGH (ref 101–111)
Creatinine, Ser: 1.44 mg/dL — ABNORMAL HIGH (ref 0.61–1.24)
GFR, EST AFRICAN AMERICAN: 53 mL/min — AB (ref >60)
GFR, EST NON AFRICAN AMERICAN: 45 mL/min — AB (ref >60)
Glucose, Bld: 94 mg/dL (ref 65–99)
POTASSIUM: 3.2 mmol/L — AB (ref 3.5–5.1)
Sodium: 142 mmol/L (ref 135–145)

## 2016-02-15 LAB — CBC WITH DIFFERENTIAL/PLATELET
BASOS ABS: 0 10*3/uL (ref 0.0–0.1)
BASOS PCT: 0 %
EOS PCT: 1 %
Eosinophils Absolute: 0.1 10*3/uL (ref 0.0–0.7)
HCT: 40.2 % (ref 39.0–52.0)
Hemoglobin: 14 g/dL (ref 13.0–17.0)
Lymphocytes Relative: 26 %
Lymphs Abs: 1.7 10*3/uL (ref 0.7–4.0)
MCH: 30.2 pg (ref 26.0–34.0)
MCHC: 34.8 g/dL (ref 30.0–36.0)
MCV: 86.8 fL (ref 78.0–100.0)
MONO ABS: 0.4 10*3/uL (ref 0.1–1.0)
Monocytes Relative: 6 %
NEUTROS ABS: 4.2 10*3/uL (ref 1.7–7.7)
Neutrophils Relative %: 67 %
PLATELETS: 210 10*3/uL (ref 150–400)
RBC: 4.63 MIL/uL (ref 4.22–5.81)
RDW: 13.3 % (ref 11.5–15.5)
WBC: 6.4 10*3/uL (ref 4.0–10.5)

## 2016-02-15 IMAGING — CT CT CERVICAL SPINE W/O CM
3 of 8 series · 9 of 33 positions shown, 11 images · non-contrast
Comparison: CT head and cervical spine April 15, 2015

CLINICAL DATA: Found down in hall at [HOSPITAL], unknown head
injury or loss of consciousness. History of dementia, stroke,
hypertension.

EXAM:
CT HEAD WITHOUT CONTRAST
CT CERVICAL SPINE WITHOUT CONTRAST
TECHNIQUE: Multidetector CT imaging of the head and cervical spine was
performed following the standard protocol without intravenous
contrast. Multiplanar CT image reconstructions of the cervical spine
were also generated.

[Series 9: axial recon · axial · 0.20mm/px · z∈[+1098,+1098]mm · 1 of 98 slices shown, 2 images]
[im 49/98  soft-tissue]
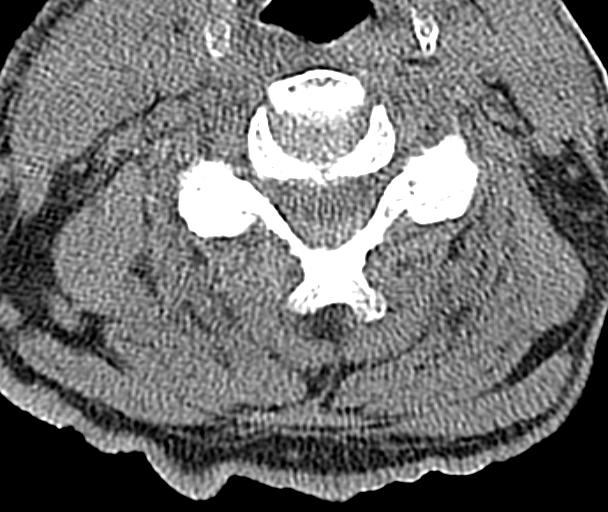
[im 49/98  bone]
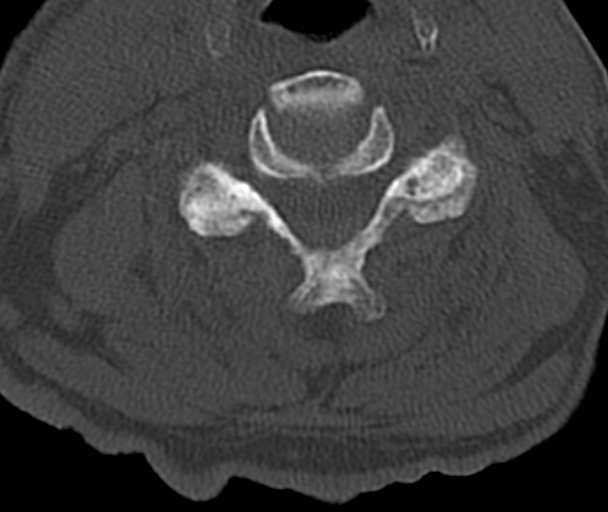

[Series 10: coronal · coronal · 0.23mm/px · 3 of 48 slices shown]
[im 10/48  bone]
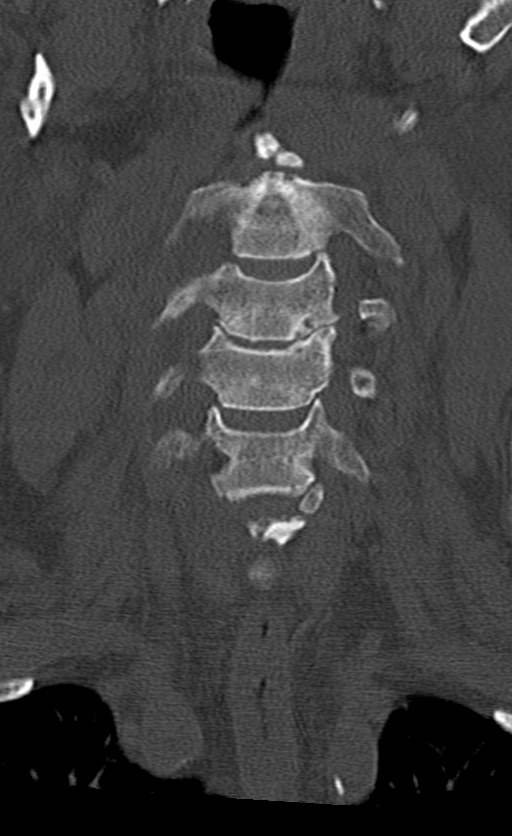
[im 19/48  bone]
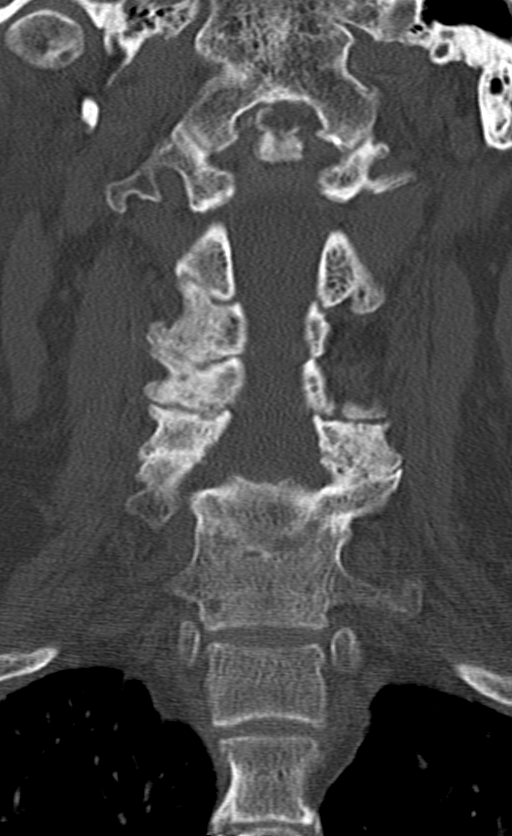
[im 29/48  bone]
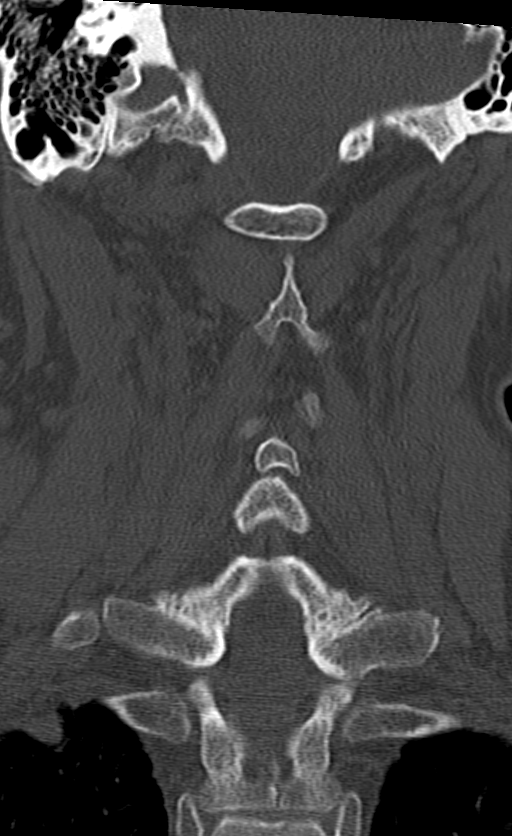

[Series 11: sagittal · sagittal · 0.19mm/px · 5 of 44 slices shown, 6 images]
[im 15/44  bone]
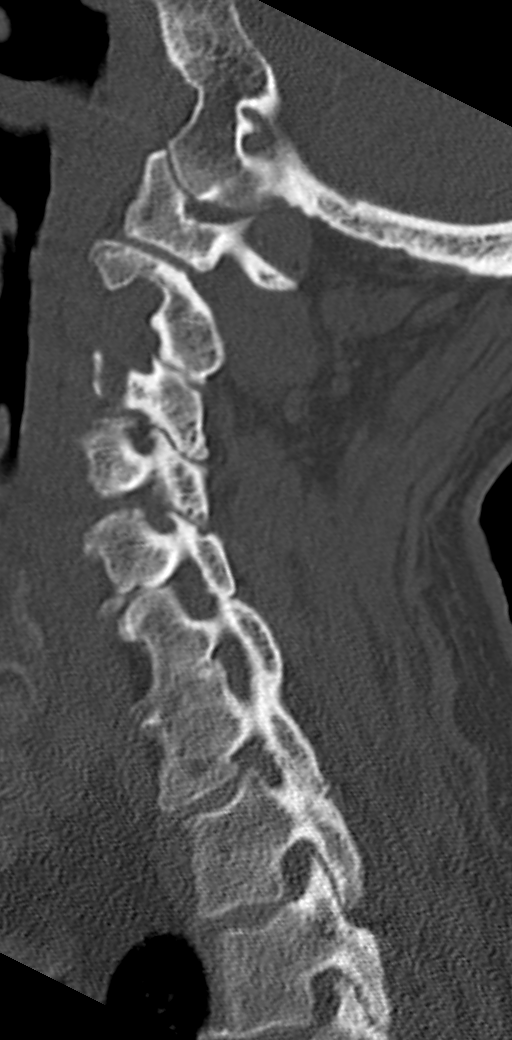
[im 18/44  bone]
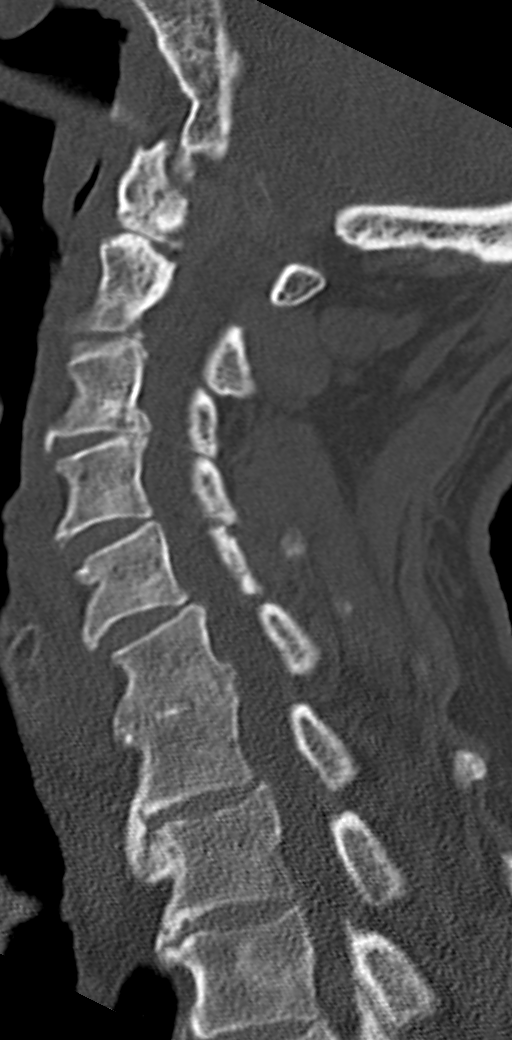
[im 22/44  soft-tissue]
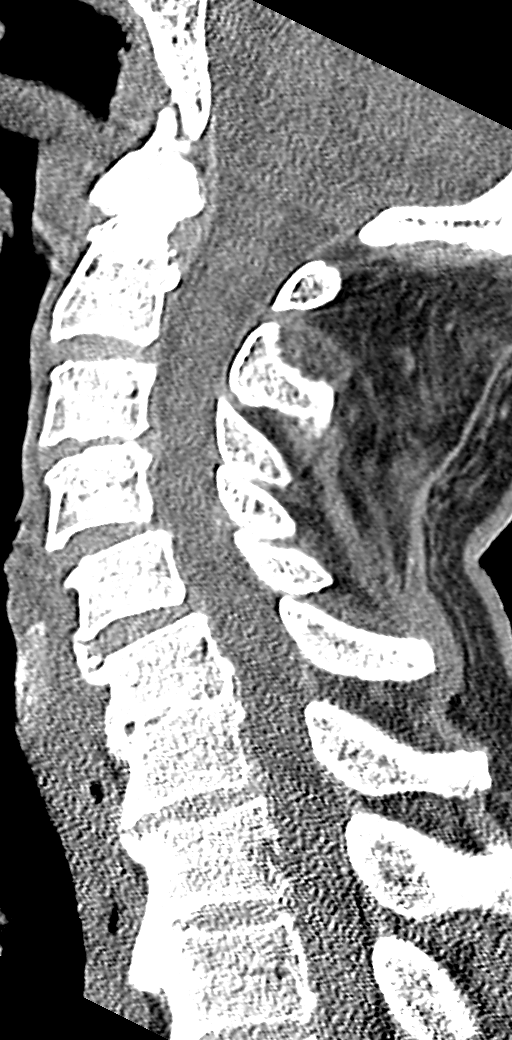
[im 22/44  bone]
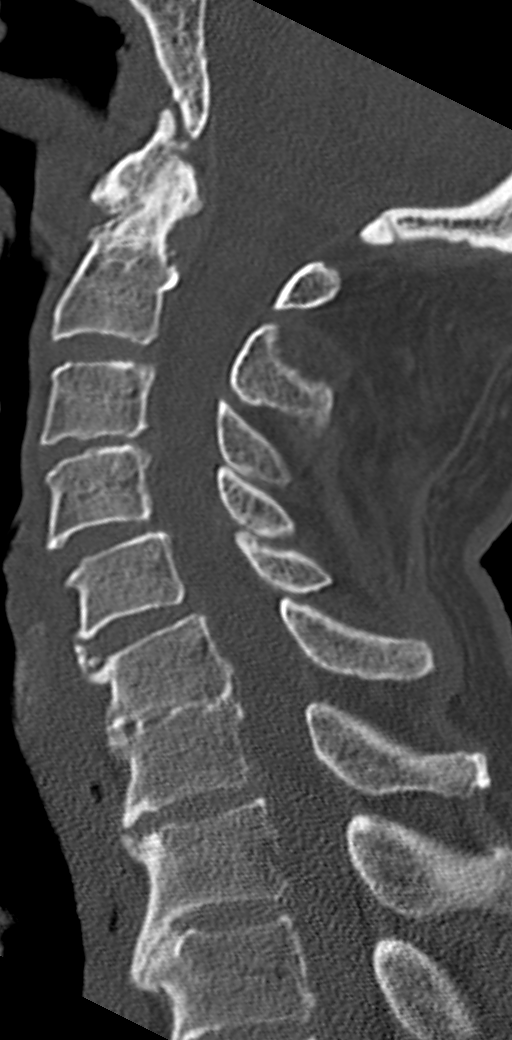
[im 26/44  bone]
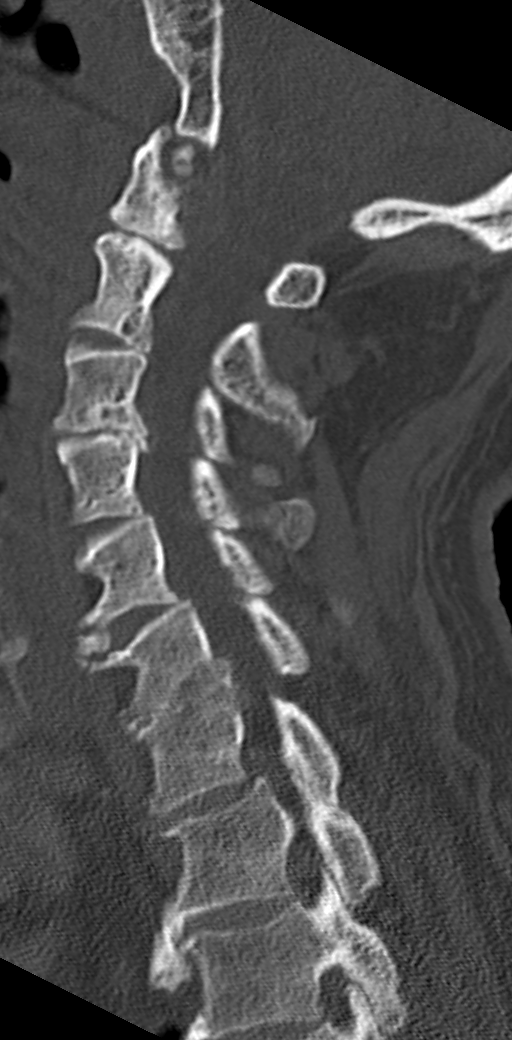
[im 29/44  bone]
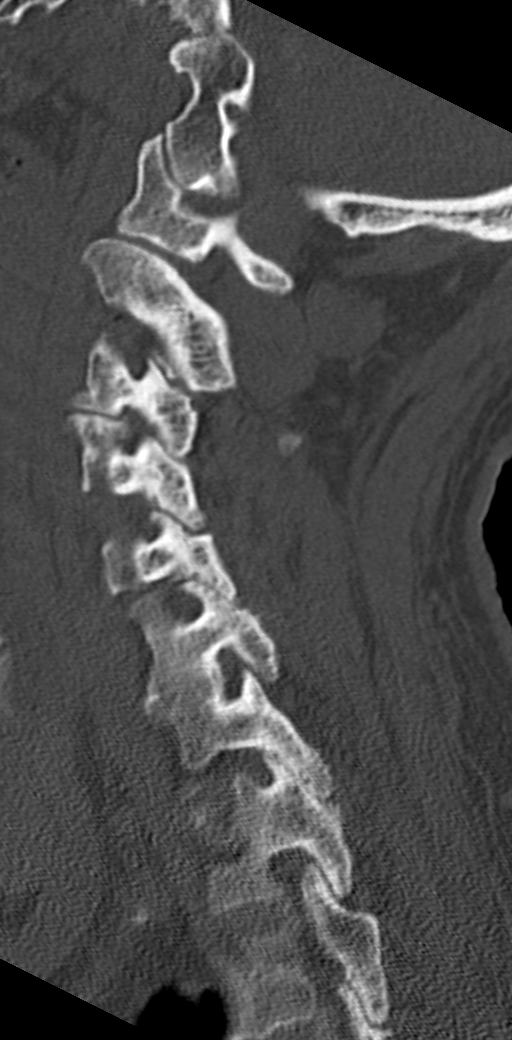

[9 of 33 positions shown; findings below may reference images not displayed]

FINDINGS: CT HEAD FINDINGS

Mildly motion degraded examination. The ventricles and sulci are
proportionally mildly prominent for age, unchanged. No
intraparenchymal hemorrhage, mass effect nor midline shift. Patchy
supratentorial white matter hypodensities are within normal range
for patient's age and though non-specific suggest sequelae of
chronic small vessel ischemic disease. No acute large vascular
territory infarcts. RIGHT frontal lobe encephalomalacia.

No abnormal extra-axial fluid collections. Basal cisterns are
patent. Mild calcific atherosclerosis of the carotid siphons.

No skull fracture. The included ocular globes and orbital contents
are non-suspicious. The mastoid aircells and included paranasal
sinuses are well-aerated.

CT CERVICAL SPINE FINDINGS

Mild motion degraded examination. Cervical vertebral bodies and
posterior elements intact. Grade 1 C4-5 and C5-6 anterolisthesis is
unchanged. C6-7 auto interbody arthrodesis. Moderate to severe C3-4
disc height loss, uncovertebral hypertrophy resulting in severe
neural foraminal narrowing. Moderate to severe neural foraminal
narrowing C4-5 through C6-7. Moderate to severe multilevel facet
arthropathy. Severe atlantodental osteoarthrosis. No destructive
bony lesions. Moderate calcific atherosclerosis of the carotid
bulbs.
IMPRESSION: CT HEAD: No acute intracranial process.

Mild global parenchymal brain volume loss for age. RIGHT frontal
encephalomalacia may be posttraumatic or ischemic.

CT CERVICAL SPINE: No acute fracture. Stable grade 1 C4-5 and C5-6
anterolisthesis on degenerative basis.

## 2016-02-15 MED ORDER — POTASSIUM CHLORIDE CRYS ER 20 MEQ PO TBCR
40.0000 meq | EXTENDED_RELEASE_TABLET | Freq: Once | ORAL | Status: DC
  Filled 2016-02-15: qty 2

## 2016-02-15 MED ORDER — POTASSIUM CHLORIDE 10 MEQ/100ML IV SOLN
10.0000 meq | Freq: Once | INTRAVENOUS | Status: AC
  Administered 2016-02-15: 10 meq via INTRAVENOUS
  Filled 2016-02-15: qty 100

## 2016-02-15 MED ORDER — LORAZEPAM 2 MG/ML IJ SOLN
1.0000 mg | Freq: Once | INTRAMUSCULAR | Status: AC
  Administered 2016-02-15: 1 mg via INTRAMUSCULAR
  Filled 2016-02-15: qty 1

## 2016-02-15 MED ORDER — CEPHALEXIN 500 MG PO CAPS: 500.0000 mg | ORAL_CAPSULE | Freq: Four times a day (QID) | ORAL | Status: DC

## 2016-02-15 NOTE — ED Notes (Addendum)
[facility] due malodorous and dark urine.

## 2016-02-15 NOTE — ED Provider Notes (Signed)
CSN: [id number]     Arrival date & time 02/15/16  1636 History   First MD Initiated Contact with Patient 02/15/16 1701     No chief complaint on file.    (Consider location/radiation/quality/duration/timing/severity/associated sxs/prior Treatment) HPI Comments: Patient here with concern for possible UTI. He has a history of dementia and therefore the history is limited. He has had some malodorous and dark urine. According to the patient's nurse today, he is at his baseline neurologically. Patient himself has no complaints of. No treatment was use to arrival. Nursing home denies any reported history of fever  The history is provided by the patient, medical records and the EMS personnel. The history is limited by the condition of the patient.    Past Medical History  Diagnosis Date  . Hypertension   . Stroke (HCC)   . Dementia   . Urinary retention 09/22/2014   Past Surgical History  Procedure Laterality Date  . Inguinal hernia repair N/A 06/20/2014    Procedure: LAPAROSCOPIC BILATERAL INGUINAL HERNIA REPAIR, LEFT FEMORAL AND INCARCERATED SUPRA UMBILICAL HERNIA REPAIR WITH MESH;  Surgeon: Karie Soda, MD;  Location: [facility];  Service: General;  Laterality: N/A;  . Insertion of mesh N/A 06/20/2014    Procedure: INSERTION OF MESH;  Surgeon: Karie Soda, MD;  Location: [facility];  Service: General;  Laterality: N/A;   Family History  Problem Relation Age of Onset  . Stroke Mother   . Hypertension Mother    Social History  Substance Use Topics  . Smoking status: Never Smoker   . Smokeless tobacco: Never Used  . Alcohol Use: No    Review of Systems  Unable to perform ROS: Dementia      Allergies  Review of patient's allergies indicates no known allergies.  Home Medications   Prior to Admission medications   Medication Sig Start Date End Date Taking? Authorizing Provider  acetaminophen (TYLENOL) 500 MG tablet Take 500 mg by mouth every 4 (four) hours as needed for mild pain or  moderate pain (pain).     Historical Provider, MD  alum & mag hydroxide-simeth (MAALOX PLUS) 400-400-40 MG/5ML suspension Take 30 mLs by mouth every 6 (six) hours as needed for indigestion.    Historical Provider, MD  aluminum-magnesium hydroxide-simethicone (MAALOX) 200-200-20 MG/5ML SUSP Take 30 mLs by mouth every 6 (six) hours as needed (heartburn/indigestion). Standing order    Historical Provider, MD  aspirin 81 MG chewable tablet Chew 81 mg by mouth daily.    Historical Provider, MD  cetirizine (ZYRTEC) 10 MG tablet Take 10 mg by mouth daily as needed (itching).    Historical Provider, MD  cholecalciferol (VITAMIN D) 1000 UNITS tablet Take 1,000 Units by mouth daily.    Historical Provider, MD  divalproex (DEPAKOTE SPRINKLE) 125 MG capsule Take 250 mg by mouth 2 (two) times daily.     Historical Provider, MD  guaifenesin (ROBITUSSIN) 100 MG/5ML syrup Take 200 mg by mouth every 6 (six) hours as needed for cough.     Historical Provider, MD  loperamide (IMODIUM) 2 MG capsule Take 2 mg by mouth as needed for diarrhea or loose stools.     Historical Provider, MD  LORazepam (ATIVAN) 0.5 MG tablet Take 0.5 mg by mouth every 8 (eight) hours. Take 0.5mg s daily at 8am, 2pm,and 8pm. May take 0.5mg s every 8 hours as needed for anxiety    Historical Provider, MD  magnesium hydroxide (MILK OF MAGNESIA) 400 MG/5ML suspension Take 30 mLs by mouth at bedtime as needed  for mild constipation. Reported on 01/02/2016    Historical Provider, MD  memantine (NAMENDA XR) 7 MG CP24 24 hr capsule Take 7 mg by mouth daily.    Historical Provider, MD  mirtazapine (REMERON) 30 MG tablet Take 30 mg by mouth at bedtime.    Historical Provider, MD  neomycin-bacitracin-polymyxin (NEOSPORIN) [phone] ointment Apply 1 application topically daily as needed (for skin tears,abrasions,minor irritation).     Historical Provider, MD  Nutritional Supplements (GLUCERNA SHAKE PO) Take 1 each by mouth 3 (three) times daily. Mighty Shakes     Historical Provider, MD  tamsulosin (FLOMAX) 0.4 MG CAPS capsule Take 1 capsule (0.4 mg total) by mouth daily. 09/27/14   Brittainy Sherlynn CarbonM Simmons, PA-C  traMADol (ULTRAM) 50 MG tablet Take 100 mg by mouth every 6 (six) hours as needed for moderate pain or severe pain (pain). 06/20/14   Karie SodaSteven Gross, MD   BP 137/79 mmHg  Pulse 73  Temp(Src) 98.9 F (37.2 C) (Rectal)  Resp 18  SpO2 100% Physical Exam  Constitutional: He appears well-developed and well-nourished.  Non-toxic appearance. No distress.  HENT:  Head: Normocephalic and atraumatic.  Eyes: Conjunctivae, EOM and lids are normal. Pupils are equal, round, and reactive to light.  Neck: Normal range of motion. Neck supple. No tracheal deviation present. No thyroid mass present.  Cardiovascular: Normal rate, regular rhythm and normal heart sounds.  Exam reveals no gallop.   No murmur heard. Pulmonary/Chest: Effort normal and breath sounds normal. No stridor. No respiratory distress. He has no decreased breath sounds. He has no wheezes. He has no rhonchi. He has no rales.  Abdominal: Soft. Normal appearance and bowel sounds are normal. He exhibits no distension. There is no tenderness. There is no rebound and no CVA tenderness.  Musculoskeletal: Normal range of motion. He exhibits no edema or tenderness.  Neurological: He is alert. He has normal strength. No cranial nerve deficit or sensory deficit. GCS eye subscore is 4. GCS verbal subscore is 5. GCS motor subscore is 6.  Skin: Skin is warm and dry. No abrasion and no rash noted.  Psychiatric: He has a normal mood and affect. His behavior is normal. His speech is tangential.  Nursing note and vitals reviewed.   ED Course  Procedures (including critical care time) Labs Review Labs Reviewed  URINE CULTURE  URINALYSIS, ROUTINE W REFLEX MICROSCOPIC (NOT AT [facility])  CBC WITH DIFFERENTIAL/PLATELET  BASIC METABOLIC PANEL    Imaging Review No results found. I have personally reviewed and  evaluated these images and lab results as part of my medical decision-making.   EKG Interpretation None      MDM   Final diagnoses:  SOB (shortness of breath)   Patient had a bladder scan which showed 200 mL of urine. I attempted to retract the patient's foreskin without success. I cannot see the meatus. Case was discussed with urology on call who recommended that patient be empirically started on antibiotics and given referral to the office. Patient had mild hypokalemia with potassium 3.2 and was given potassium supplementation      Lorre NickAnthony Keyshun Elpers, MD 02/15/16 2111

## 2016-02-15 NOTE — ED Notes (Signed)
[facility] of need [facility]

## 2016-02-15 NOTE — ED Notes (Signed)
Bed: RESB Expected date:  Expected time:  Means of arrival:  Comments: [facility]-Fever

## 2016-02-15 NOTE — ED Notes (Signed)
Patient is calm and cooperative at present time

## 2016-02-15 NOTE — Discharge Instructions (Signed)
Follow-up with the urologist in 3 days Urinary Tract Infection Urinary tract infections (UTIs) can develop anywhere along your urinary tract. Your urinary tract is your body's drainage system for removing wastes and extra water. Your urinary tract includes two kidneys, two ureters, a bladder, and a urethra. Your kidneys are a pair of bean-shaped organs. Each kidney is about the size of your fist. They are located below your ribs, one on each side of your spine. CAUSES Infections are caused by microbes, which are microscopic organisms, including fungi, viruses, and bacteria. These organisms are so small that they can only be seen through a microscope. Bacteria are the microbes that most commonly cause UTIs. SYMPTOMS  Symptoms of UTIs may vary by age and gender of the patient and by the location of the infection. Symptoms in young women typically include a frequent and intense urge to urinate and a painful, burning feeling in the bladder or urethra during urination. Older women and men are more likely to be tired, shaky, and weak and have muscle aches and abdominal pain. A fever may mean the infection is in your kidneys. Other symptoms of a kidney infection include pain in your back or sides below the ribs, nausea, and vomiting. DIAGNOSIS To diagnose a UTI, your caregiver will ask you about your symptoms. Your caregiver will also ask you to provide a urine sample. The urine sample will be tested for bacteria and white blood cells. White blood cells are made by your body to help fight infection. TREATMENT  Typically, UTIs can be treated with medication. Because most UTIs are caused by a bacterial infection, they usually can be treated with the use of antibiotics. The choice of antibiotic and length of treatment depend on your symptoms and the type of bacteria causing your infection. HOME CARE INSTRUCTIONS  If you were prescribed antibiotics, take them exactly as your caregiver instructs you. Finish the  medication even if you feel better after you have only taken some of the medication.  Drink enough water and fluids to keep your urine clear or pale yellow.  Avoid caffeine, tea, and carbonated beverages. They tend to irritate your bladder.  Empty your bladder often. Avoid holding urine for long periods of time.  Empty your bladder before and after sexual intercourse.  After a bowel movement, women should cleanse from front to back. Use each tissue only once. SEEK MEDICAL CARE IF:   You have back pain.  You develop a fever.  Your symptoms do not begin to resolve within 3 days. SEEK IMMEDIATE MEDICAL CARE IF:   You have severe back pain or lower abdominal pain.  You develop chills.  You have nausea or vomiting.  You have continued burning or discomfort with urination. MAKE SURE YOU:   Understand these instructions.  Will watch your condition.  Will get help right away if you are not doing well or get worse.   This information is not intended to replace advice given to you by your health care provider. Make sure you discuss any questions you have with your health care provider.   Document Released: 07/03/2005 Document Revised: 06/14/2015 Document Reviewed: 11/01/2011 Elsevier Interactive Patient Education 2016 [employer]. Paraphimosis Paraphimosis is a serious condition in which the fold of skin that stretches over the tip of the penis (foreskin) becomes stuck when it is pulled back. This condition blocks blood from flowing away from the penis tip, and that causes swelling that gets worse and worse. Paraphimosis needs to be treated right  away. CAUSES This condition may be caused by:  A foreskin that is tighter than normal.  Leaving the foreskin pulled back after a procedure, such as after the placement of a catheter.  A foreskin that has been pulled back for too long.  A forceful pulling back of the foreskin.  Infection under the foreskin.  Trauma to the area,  such as a hard hit to the the tip of the penis.  Having sex or masturbating.  Hair or clothing that gets wrapped around the penis. RISK FACTORS This condition is more likely to develop in:  Males who have not had their foreskin removed.  Males who have frequent urological procedures.  Males who have poor hygiene.  Males who need help with daily hygiene from a caregiver. SYMPTOMS Symptoms of this condition include:  A foreskin that cannot be returned to its normal position.  Pain, often at the tip of the penis.  Swelling of the penis.  Anxiety.  Trouble urinating.  Skin that is red or bluish at the tip of the penis. DIAGNOSIS This condition may be diagnosed with a physical exam. You may also have X-rays. TREATMENT This condition may be treated with:  A procedure to force the foreskin back over the tip of the penis. Swelling may first be reduced with:  Ice.  A bandage wrapped tightly around the penis.  Needles to drain any pus or blood that is causing the swelling.  Medicine to relieve pain. Medicine may be given by mouth, through an IV tube, or by an injection to the base of the penis (nerve block).  A procedure in which a small cut (incision) is made in the tightened foreskin to free it and allow it to be pulled back into place (dorsal slit procedure).  Surgery to remove the foreskin (circumcision). This may be done if the foreskin cannot be moved back into place. Most of the time, treatment can be done in a clinic or a health care provider's office. HOME CARE INSTRUCTIONS  Take over-the-counter and prescription medicines only as told by your health care provider.  Apply any creams to the affected area only as told by your health care provider.  If the treated area is covered with gauze or a bandage (dressing), follow instructions from your health care provider about:  When and how you should change your bandage (dressing).  When you should remove your  dressing.  Whether the area can get wet.  Wear loose undergarments to avoid applying pressure to the area.  Follow instructions from your health care provider about avoiding sexual activity.  Keep all follow-up visits as told by your health care provider. This is important. SEEK MEDICAL CARE IF:  The treated area of skin does not heal, or it becomes more irritated, red, or bloody.  Urination is difficult or painful.  Pain in the penis continues, even after you take medicine for pain. SEEK IMMEDIATE MEDICAL CARE IF:  You develop a fever.   This information is not intended to replace advice given to you by your health care provider. Make sure you discuss any questions you have with your health care provider.   Document Released: 07/21/2009 Document Revised: 06/14/2015 Document Reviewed: 12/19/2014 Elsevier Interactive Patient Education [employer]2016 Elsevier Inc.

## 2016-02-15 NOTE — ED Notes (Signed)
[facility]

## 2016-02-15 NOTE — ED Notes (Signed)
Bladder scan - 200

## 2016-02-15 NOTE — ED Notes (Signed)
Patient is agitated, kicking and swinging at staff, refusing all treatment, baring teeth at this writer and shaking his fist at staff, threatening to punch staff.  Unable to complete any care at this time, Dr. Freida BusmanAllen is aware and to bedside. Unable to visualize meatus due to swollen and large amounts foreskin, Dr. Freida BusmanAllen unable to retract foreskin.  Bladder scan ~200 cc's, patient had to be restrained with gurney cuffs to protect patient and staff during examination and further care. Administered Ativan 1 mg IM for agitation. Patient is declining po potassium, IV potassium per pump initiated.  Patient in full view of nursing station.

## 2016-03-04 ENCOUNTER — Emergency Department (HOSPITAL_COMMUNITY): Payer: Medicare Other

## 2016-03-04 ENCOUNTER — Encounter (HOSPITAL_COMMUNITY): Payer: Self-pay | Admitting: Emergency Medicine

## 2016-03-04 ENCOUNTER — Emergency Department (HOSPITAL_COMMUNITY)
Admission: EM | Admit: 2016-03-04 | Discharge: 2016-03-04 | Disposition: A | Discharge status: Skilled Nursing Facility | Payer: Medicare Other | Attending: Emergency Medicine | Admitting: Emergency Medicine

## 2016-03-04 DIAGNOSIS — F0391 Unspecified dementia with behavioral disturbance: Secondary | ICD-10-CM | POA: Diagnosis not present

## 2016-03-04 DIAGNOSIS — Z7982 Long term (current) use of aspirin: Secondary | ICD-10-CM | POA: Diagnosis not present

## 2016-03-04 DIAGNOSIS — Z8673 Personal history of transient ischemic attack (TIA), and cerebral infarction without residual deficits: Secondary | ICD-10-CM | POA: Insufficient documentation

## 2016-03-04 DIAGNOSIS — I10 Essential (primary) hypertension: Secondary | ICD-10-CM | POA: Insufficient documentation

## 2016-03-04 DIAGNOSIS — R451 Restlessness and agitation: Secondary | ICD-10-CM | POA: Diagnosis present

## 2016-03-04 LAB — COMPREHENSIVE METABOLIC PANEL
ALBUMIN: 4.4 g/dL (ref 3.5–5.0)
ALK PHOS: 65 U/L (ref 38–126)
ALT: 12 U/L — ABNORMAL LOW (ref 17–63)
AST: 20 U/L (ref 15–41)
Anion gap: 12 (ref 5–15)
BILIRUBIN TOTAL: 1.2 mg/dL (ref 0.3–1.2)
BUN: 25 mg/dL — AB (ref 6–20)
CALCIUM: 9.3 mg/dL (ref 8.9–10.3)
CO2: 20 mmol/L — AB (ref 22–32)
Chloride: 107 mmol/L (ref 101–111)
Creatinine, Ser: 1.32 mg/dL — ABNORMAL HIGH (ref 0.61–1.24)
GFR calc Af Amer: 58 mL/min — ABNORMAL LOW (ref >60)
GFR calc non Af Amer: 50 mL/min — ABNORMAL LOW (ref >60)
GLUCOSE: 90 mg/dL (ref 65–99)
POTASSIUM: 3.8 mmol/L (ref 3.5–5.1)
Sodium: 139 mmol/L (ref 135–145)
TOTAL PROTEIN: 6.8 g/dL (ref 6.5–8.1)

## 2016-03-04 LAB — URINALYSIS, ROUTINE W REFLEX MICROSCOPIC
Bilirubin Urine: NEGATIVE
GLUCOSE, UA: NEGATIVE mg/dL
HGB URINE DIPSTICK: NEGATIVE
KETONES UR: NEGATIVE mg/dL
LEUKOCYTES UA: NEGATIVE
Nitrite: NEGATIVE
PROTEIN: NEGATIVE mg/dL
Specific Gravity, Urine: 1.01 (ref 1.005–1.030)
pH: 8.5 — ABNORMAL HIGH (ref 5.0–8.0)

## 2016-03-04 LAB — CBC WITH DIFFERENTIAL/PLATELET
BASOS ABS: 0 10*3/uL (ref 0.0–0.1)
Basophils Relative: 0 %
EOS PCT: 1 %
Eosinophils Absolute: 0.1 10*3/uL (ref 0.0–0.7)
HCT: 43 % (ref 39.0–52.0)
Hemoglobin: 14.7 g/dL (ref 13.0–17.0)
LYMPHS PCT: 25 %
Lymphs Abs: 2 10*3/uL (ref 0.7–4.0)
MCH: 30.8 pg (ref 26.0–34.0)
MCHC: 34.2 g/dL (ref 30.0–36.0)
MCV: 90 fL (ref 78.0–100.0)
MONO ABS: 0.7 10*3/uL (ref 0.1–1.0)
MONOS PCT: 9 %
Neutro Abs: 5.3 10*3/uL (ref 1.7–7.7)
Neutrophils Relative %: 65 %
PLATELETS: 180 10*3/uL (ref 150–400)
RBC: 4.78 MIL/uL (ref 4.22–5.81)
RDW: 13.5 % (ref 11.5–15.5)
WBC: 8.1 10*3/uL (ref 4.0–10.5)

## 2016-03-04 MED ORDER — LORAZEPAM 0.5 MG PO TABS
0.5000 mg | ORAL_TABLET | Freq: Once | ORAL | Status: AC
  Administered 2016-03-04: 0.5 mg via ORAL
  Filled 2016-03-04: qty 1

## 2016-03-04 NOTE — ED Notes (Signed)
Pt given applesauce and water. 

## 2016-03-04 NOTE — ED Notes (Signed)
[facility] given report and reports understanding of discharge information. No questions at time of discharge

## 2016-03-04 NOTE — ED Notes (Addendum)
[facility] becoming combative with the staff there. Pt has refused his ativan since yesterday. Depakote recently changed from 125mg  to 500mg  as well. Alert and calm at this time. Baseline Alzheimer's dementia.

## 2016-03-04 NOTE — ED Provider Notes (Signed)
CSN: [id number]     Arrival date & time 03/04/16  1622 History   First MD Initiated Contact with Patient 03/04/16 1642     Chief Complaint  Patient presents with  . Agitation     (Consider location/radiation/quality/duration/timing/severity/associated sxs/prior Treatment) HPI Comments: 80yo M w/ PMH including dementia, CVA, HTN who p/w agitation. Nursing facility reports that the patient became combative with staff. He has refused his Ativan since yesterday. Recently, his depakote dose was increased to 500mg  from 125mg . Pt currently denies any complaints with the exception of mild b/l foot pain which he states he has had for a while. He denies any other problems.   LEVEL 5 CAVEAT DUE TO DEMENTIAA  The history is provided by the EMS personnel and the nursing home.    Past Medical History  Diagnosis Date  . Hypertension   . Stroke (HCC)   . Dementia   . Urinary retention 09/22/2014   Past Surgical History  Procedure Laterality Date  . Inguinal hernia repair N/A 06/20/2014    Procedure: LAPAROSCOPIC BILATERAL INGUINAL HERNIA REPAIR, LEFT FEMORAL AND INCARCERATED SUPRA UMBILICAL HERNIA REPAIR WITH MESH;  Surgeon: Karie SodaSteven Gross, MD;  Location: [facility];  Service: General;  Laterality: N/A;  . Insertion of mesh N/A 06/20/2014    Procedure: INSERTION OF MESH;  Surgeon: Karie SodaSteven Gross, MD;  Location: [facility];  Service: General;  Laterality: N/A;   Family History  Problem Relation Age of Onset  . Stroke Mother   . Hypertension Mother    Social History  Substance Use Topics  . Smoking status: Never Smoker   . Smokeless tobacco: Never Used  . Alcohol Use: No    Review of Systems  Unable to perform ROS: Dementia      Allergies  Review of patient's allergies indicates no known allergies.  Home Medications   Prior to Admission medications   Medication Sig Start Date End Date Taking? Authorizing Provider  acetaminophen (TYLENOL) 500 MG tablet Take 500 mg by mouth every 4 (four) hours  as needed for mild pain, moderate pain, fever or headache.    Yes Historical Provider, MD  alum & mag hydroxide-simeth (MAALOX PLUS) 400-400-40 MG/5ML suspension Take 30 mLs by mouth every 6 (six) hours as needed for indigestion.   Yes Historical Provider, MD  aluminum-magnesium hydroxide-simethicone (MAALOX) 200-200-20 MG/5ML SUSP Take 30 mLs by mouth every 6 (six) hours as needed (heartburn/indigestion). Standing order   Yes Historical Provider, MD  aspirin 81 MG chewable tablet Chew 81 mg by mouth daily.   Yes Historical Provider, MD  cetirizine (ZYRTEC) 10 MG tablet Take 10 mg by mouth daily as needed (itching).   Yes Historical Provider, MD  cholecalciferol (VITAMIN D) 1000 UNITS tablet Take 1,000 Units by mouth daily.   Yes Historical Provider, MD  divalproex (DEPAKOTE) 500 MG DR tablet Take 500 mg by mouth 2 (two) times daily.   Yes Historical Provider, MD  guaifenesin (ROBITUSSIN) 100 MG/5ML syrup Take 200 mg by mouth every 6 (six) hours as needed for cough.    Yes Historical Provider, MD  loperamide (IMODIUM) 2 MG capsule Take 2 mg by mouth as needed for diarrhea or loose stools (with each loose stool (Do not exceed 8 doses in 24 hours).).    Yes Historical Provider, MD  LORazepam (ATIVAN) 0.5 MG tablet Take 0.25 mg by mouth 3 (three) times daily.    Yes Historical Provider, MD  LORazepam (ATIVAN) 0.5 MG tablet Take 0.5 mg by mouth every 8 (eight)  hours as needed for anxiety.   Yes Historical Provider, MD  magnesium hydroxide (MILK OF MAGNESIA) 400 MG/5ML suspension Take 30 mLs by mouth at bedtime as needed for mild constipation or moderate constipation. Reported on 01/02/2016   Yes Historical Provider, MD  Melatonin 3 MG TABS Take 3 mg by mouth at bedtime.   Yes Historical Provider, MD  mirtazapine (REMERON) 30 MG tablet Take 30 mg by mouth at bedtime.   Yes Historical Provider, MD  NAMENDA XR 14 MG CP24 24 hr capsule Take 14 mg by mouth daily. 01/30/16  Yes Historical Provider, MD   neomycin-bacitracin-polymyxin (NEOSPORIN) [phone] ointment Apply 1 application topically daily as needed (for skin tears, abrasions, or minor irritation).    Yes Historical Provider, MD  Nutritional Supplements (GLUCERNA SHAKE PO) Take 1 each by mouth 3 (three) times daily. Mighty Shakes   Yes Historical Provider, MD  QUEtiapine (SEROQUEL) 25 MG tablet Take 25 mg by mouth 2 (two) times daily.   Yes Historical Provider, MD  tamsulosin (FLOMAX) 0.4 MG CAPS capsule Take 1 capsule (0.4 mg total) by mouth daily. 09/27/14  Yes Brittainy Sherlynn Carbon, PA-C  cephALEXin (KEFLEX) 500 MG capsule Take 1 capsule (500 mg total) by mouth 4 (four) times daily. Patient not taking: Reported on 03/04/2016 02/15/16   Lorre Nick, MD   BP 120/80 mmHg  Pulse 66  Temp(Src) 98.4 F (36.9 C) (Oral)  Resp 18  SpO2 98% Physical Exam  Constitutional: He appears well-developed and well-nourished. No distress.  pleasant  HENT:  Head: Normocephalic and atraumatic.  Moist mucous membranes  Eyes: Conjunctivae are normal. Pupils are equal, round, and reactive to light.  Neck: Neck supple.  Cardiovascular: Normal rate, regular rhythm, normal heart sounds and intact distal pulses.   No murmur heard. Pulmonary/Chest: Effort normal and breath sounds normal.  Abdominal: Soft. Bowel sounds are normal. He exhibits no distension. There is no tenderness.  Musculoskeletal: He exhibits no edema or tenderness.  No foot pain, feet warm and well perfused  Neurological: He is alert.  Fluent speech, oriented to person and place, able to follow commands, occasional mild confusion during conversation  Skin: Skin is warm and dry. No rash noted.  Psychiatric:  Calm, pleasant, cooperative  Nursing note and vitals reviewed.   ED Course  Procedures (including critical care time) Labs Review Labs Reviewed  COMPREHENSIVE METABOLIC PANEL - Abnormal; Notable for the following:    CO2 20 (*)    BUN 25 (*)    Creatinine, Ser 1.32 (*)     ALT 12 (*)    GFR calc non Af Amer 50 (*)    GFR calc Af Amer 58 (*)    All other components within normal limits  URINALYSIS, ROUTINE W REFLEX MICROSCOPIC (NOT AT [facility]) - Abnormal; Notable for the following:    pH 8.5 (*)    All other components within normal limits  CBC WITH DIFFERENTIAL/PLATELET    Imaging Review Dg Chest 2 View  03/04/2016  CLINICAL DATA:  80 year old male with increasing agitation. EXAM: CHEST  2 VIEW COMPARISON:  Chest x-ray 02/15/2016. FINDINGS: Lung volumes are normal. No consolidative airspace disease. No pleural effusions. No pneumothorax. No pulmonary nodule or mass noted. Pulmonary vasculature and the cardiomediastinal silhouette are within normal limits. Atherosclerosis in the thoracic aorta. IMPRESSION: 1.  No radiographic evidence of acute cardiopulmonary disease. 2. Atherosclerosis. Electronically Signed   By: Trudie Reed M.D.   On: 03/04/2016 18:15   I have personally reviewed and evaluated  these lab results as part of my medical decision-making.   EKG Interpretation None     Medications  LORazepam (ATIVAN) tablet 0.5 mg (0.5 mg Oral Given 03/04/16 1813)    MDM   Final diagnoses:  Dementia, with behavioral disturbance   Pt Sent in from nursing facility for being combative with staff. On exam, the patient was awake, oriented to person and place, pleasant and cooperative during my examination. Vital signs unremarkable. Obtained lab work and chest x-ray to evaluate for infection. Labs show stable Cr, normal CBC, no evidence of UTI. CXR negative acute. The patient has remained calm and was cooperative in taking his home dose of Ativan here. He has had no agitation or combativeness and I suspect his symptoms may have been due to his underlying dementia. Vital signs unremarkable. Patient stable for discharge back to nursing facility.  Laurence Spates, MD 03/04/16 [place]

## 2016-03-04 NOTE — ED Notes (Signed)
[facility] here for transport. 

## 2016-03-04 NOTE — ED Notes (Signed)
[facility] [employer] [facility]

## 2016-03-04 NOTE — ED Notes (Signed)
Patient transported to X-ray 

## 2016-03-04 NOTE — Discharge Instructions (Signed)

## 2016-03-04 NOTE — ED Notes (Signed)
Pt took ativan crushed and mixed in applesauce.

## 2016-03-04 NOTE — ED Notes (Signed)
Bed: [id number] Expected date:  Expected time:  Means of arrival:  Comments: 75M/agitated/combative

## 2016-03-21 ENCOUNTER — Emergency Department (HOSPITAL_COMMUNITY)
Admission: EM | Admit: 2016-03-21 | Discharge: 2016-03-21 | Disposition: A | Discharge status: Skilled Nursing Facility | Payer: Medicare Other | Attending: Emergency Medicine | Admitting: Emergency Medicine

## 2016-03-21 ENCOUNTER — Emergency Department (HOSPITAL_COMMUNITY): Payer: Medicare Other

## 2016-03-21 ENCOUNTER — Encounter (HOSPITAL_COMMUNITY): Payer: Self-pay | Admitting: Emergency Medicine

## 2016-03-21 DIAGNOSIS — Z79899 Other long term (current) drug therapy: Secondary | ICD-10-CM | POA: Insufficient documentation

## 2016-03-21 DIAGNOSIS — I10 Essential (primary) hypertension: Secondary | ICD-10-CM | POA: Diagnosis not present

## 2016-03-21 DIAGNOSIS — R0789 Other chest pain: Secondary | ICD-10-CM | POA: Insufficient documentation

## 2016-03-21 DIAGNOSIS — Z7982 Long term (current) use of aspirin: Secondary | ICD-10-CM | POA: Diagnosis not present

## 2016-03-21 DIAGNOSIS — Z8673 Personal history of transient ischemic attack (TIA), and cerebral infarction without residual deficits: Secondary | ICD-10-CM | POA: Diagnosis not present

## 2016-03-21 DIAGNOSIS — Z792 Long term (current) use of antibiotics: Secondary | ICD-10-CM | POA: Insufficient documentation

## 2016-03-21 DIAGNOSIS — R339 Retention of urine, unspecified: Secondary | ICD-10-CM

## 2016-03-21 DIAGNOSIS — R072 Precordial pain: Secondary | ICD-10-CM | POA: Diagnosis present

## 2016-03-21 LAB — CBC
HCT: 44 % (ref 39.0–52.0)
Hemoglobin: 14.7 g/dL (ref 13.0–17.0)
MCH: 29.9 pg (ref 26.0–34.0)
MCHC: 33.4 g/dL (ref 30.0–36.0)
MCV: 89.6 fL (ref 78.0–100.0)
PLATELETS: 165 10*3/uL (ref 150–400)
RBC: 4.91 MIL/uL (ref 4.22–5.81)
RDW: 13.4 % (ref 11.5–15.5)
WBC: 8 10*3/uL (ref 4.0–10.5)

## 2016-03-21 LAB — BASIC METABOLIC PANEL
Anion gap: 8 (ref 5–15)
BUN: 21 mg/dL — AB (ref 6–20)
CALCIUM: 9.6 mg/dL (ref 8.9–10.3)
CO2: 23 mmol/L (ref 22–32)
CREATININE: 1.35 mg/dL — AB (ref 0.61–1.24)
Chloride: 109 mmol/L (ref 101–111)
GFR calc Af Amer: 57 mL/min — ABNORMAL LOW (ref >60)
GFR, EST NON AFRICAN AMERICAN: 49 mL/min — AB (ref >60)
GLUCOSE: 83 mg/dL (ref 65–99)
POTASSIUM: 3.5 mmol/L (ref 3.5–5.1)
SODIUM: 140 mmol/L (ref 135–145)

## 2016-03-21 LAB — URINALYSIS, ROUTINE W REFLEX MICROSCOPIC
BILIRUBIN URINE: NEGATIVE
Glucose, UA: NEGATIVE mg/dL
HGB URINE DIPSTICK: NEGATIVE
KETONES UR: 15 mg/dL — AB
LEUKOCYTES UA: NEGATIVE
NITRITE: NEGATIVE
Protein, ur: NEGATIVE mg/dL
Specific Gravity, Urine: 1.01 (ref 1.005–1.030)
pH: 8.5 — ABNORMAL HIGH (ref 5.0–8.0)

## 2016-03-21 LAB — I-STAT TROPONIN, ED: TROPONIN I, POC: 0.01 ng/mL (ref 0.00–0.08)

## 2016-03-21 MED ORDER — TAMSULOSIN HCL 0.4 MG PO CAPS: 0.4000 mg | ORAL_CAPSULE | Freq: Two times a day (BID) | ORAL | Status: AC

## 2016-03-21 NOTE — Discharge Instructions (Signed)
Chest Wall Pain Chest wall pain is pain in or around the bones and muscles of your chest. Sometimes, an injury causes this pain. Sometimes, the cause may not be known. This pain may take several weeks or longer to get better. HOME CARE Pay attention to any changes in your symptoms. Take these actions to help with your pain:  Rest as told by your doctor.  Avoid activities that cause pain. Try not to use your chest, belly (abdominal), or side muscles to lift heavy things.  If directed, apply ice to the painful area:  Put ice in a plastic bag.  Place a towel between your skin and the bag.  Leave the ice on for 20 minutes, 2-3 times per day.  Take over-the-counter and prescription medicines only as told by your doctor.  Do not use tobacco products, including cigarettes, chewing tobacco, and e-cigarettes. If you need help quitting, ask your doctor.  Keep all follow-up visits as told by your doctor. This is important. GET HELP IF:  You have a fever.  Your chest pain gets worse.  You have new symptoms. GET HELP RIGHT AWAY IF:  You feel sick to your stomach (nauseous) or you throw up (vomit).  You feel sweaty or light-headed.  You have a cough with phlegm (sputum) or you cough up blood.  You are short of breath.   This information is not intended to replace advice given to you by your health care provider. Make sure you discuss any questions you have with your health care provider.   Document Released: 03/11/2008 Document Revised: 06/14/2015 Document Reviewed: 12/19/2014 Elsevier Interactive Patient Education 2016 Elsevier Inc. Acute Urinary Retention, Male Acute urinary retention is the temporary inability to urinate. This is a common problem in older men. As men age their prostates become larger and block the flow of urine from the bladder. This is usually a problem that has come on gradually.  HOME CARE INSTRUCTIONS If you are sent home with a Foley catheter and a drainage  system, you will need to discuss the best course of action with your health care provider. While the catheter is in, maintain a good intake of fluids. Keep the drainage bag emptied and lower than your catheter. This is so that contaminated urine will not flow back into your bladder, which could lead to a urinary tract infection. There are two main types of drainage bags. One is a large bag that usually is used at night. It has a good capacity that will allow you to sleep through the night without having to empty it. The second type is called a leg bag. It has a smaller capacity, so it needs to be emptied more frequently. However, the main advantage is that it can be attached by a leg strap and can go underneath your clothing, allowing you the freedom to move about or leave your home. Only take over-the-counter or prescription medicines for pain, discomfort, or fever as directed by your health care provider.  SEEK MEDICAL CARE IF:  You develop a low-grade fever.  You experience spasms or leakage of urine with the spasms. SEEK IMMEDIATE MEDICAL CARE IF:   You develop chills or fever.  Your catheter stops draining urine.  Your catheter falls out.  You start to develop increased bleeding that does not respond to rest and increased fluid intake. MAKE SURE YOU:  Understand these instructions.  Will watch your condition.  Will get help right away if you are not doing well or get  worse.   This information is not intended to replace advice given to you by your health care provider. Make sure you discuss any questions you have with your health care provider.   Document Released: 12/30/2000 Document Revised: 02/07/2015 Document Reviewed: 03/04/2013 Elsevier Interactive Patient Education [employer] Elsevier Inc[employer]

## 2016-03-21 NOTE — ED Notes (Signed)
EMS - [facility] Dementia unit) for c/o of chest pain prior to EMS arrival that was substernal in origin.  Patient was initially anxious and SOB and going in an out of Triginomy and Sinus rhythm.  Hx of alzheimers, alert to self and birthday.  Given 324mg  Aspirin.  Denies chest pain at this time, stating "was my chest supposed to hurt".

## 2016-03-21 NOTE — ED Notes (Addendum)
[name] prior to patient coming back to facility.

## 2016-03-21 NOTE — ED Notes (Signed)
EDP at bedside  

## 2016-03-21 NOTE — ED Provider Notes (Signed)
CSN: [id number]     Arrival date & time 03/21/16  1137 History   First MD Initiated Contact with Patient 03/21/16 1143     Chief Complaint  Patient presents with  . Chest Pain      HPI  Expand All Collapse All   EMS - Patient coming from [facility] (Alzheimers/ Dementia unit) for c/o of chest pain prior to EMS arrival that was substernal in origin. Patient was initially anxious and SOB and going in an out of Triginomy and Sinus rhythm. Hx of alzheimers, alert to self and birthday. Given 324mg  Aspirin. Denies chest pain at this time, stating "was my chest supposed to hurt".        Past Medical History  Diagnosis Date  . Hypertension   . Stroke (HCC)   . Dementia   . Urinary retention 09/22/2014   Past Surgical History  Procedure Laterality Date  . Inguinal hernia repair N/A 06/20/2014    Procedure: LAPAROSCOPIC BILATERAL INGUINAL HERNIA REPAIR, LEFT FEMORAL AND INCARCERATED SUPRA UMBILICAL HERNIA REPAIR WITH MESH;  Surgeon: Karie Soda, MD;  Location: [facility];  Service: General;  Laterality: N/A;  . Insertion of mesh N/A 06/20/2014    Procedure: INSERTION OF MESH;  Surgeon: Karie Soda, MD;  Location: [facility];  Service: General;  Laterality: N/A;   Family History  Problem Relation Age of Onset  . Stroke Mother   . Hypertension Mother    Social History  Substance Use Topics  . Smoking status: Never Smoker   . Smokeless tobacco: Never Used  . Alcohol Use: No    Review of Systems  Unable to perform ROS: Dementia      Allergies  Review of patient's allergies indicates no known allergies.  Home Medications   Prior to Admission medications   Medication Sig Start Date End Date Taking? Authorizing Provider  acetaminophen (TYLENOL) 500 MG tablet Take 500 mg by mouth every 4 (four) hours as needed for mild pain, moderate pain, fever or headache.     Historical Provider, MD  alum & mag hydroxide-simeth (MAALOX PLUS) 400-400-40 MG/5ML suspension Take 30 mLs by mouth  every 6 (six) hours as needed for indigestion.    Historical Provider, MD  aluminum-magnesium hydroxide-simethicone (MAALOX) 200-200-20 MG/5ML SUSP Take 30 mLs by mouth every 6 (six) hours as needed (heartburn/indigestion). Standing order    Historical Provider, MD  aspirin 81 MG chewable tablet Chew 81 mg by mouth daily.    Historical Provider, MD  cephALEXin (KEFLEX) 500 MG capsule Take 1 capsule (500 mg total) by mouth 4 (four) times daily. Patient not taking: Reported on 03/04/2016 02/15/16   Lorre Nick, MD  cetirizine (ZYRTEC) 10 MG tablet Take 10 mg by mouth daily as needed (itching).    Historical Provider, MD  cholecalciferol (VITAMIN D) 1000 UNITS tablet Take 1,000 Units by mouth daily.    Historical Provider, MD  divalproex (DEPAKOTE) 500 MG DR tablet Take 500 mg by mouth 2 (two) times daily.    Historical Provider, MD  guaifenesin (ROBITUSSIN) 100 MG/5ML syrup Take 200 mg by mouth every 6 (six) hours as needed for cough.     Historical Provider, MD  loperamide (IMODIUM) 2 MG capsule Take 2 mg by mouth as needed for diarrhea or loose stools (with each loose stool (Do not exceed 8 doses in 24 hours).).     Historical Provider, MD  LORazepam (ATIVAN) 0.5 MG tablet Take 0.25 mg by mouth 3 (three) times daily.     Historical  Provider, MD  LORazepam (ATIVAN) 0.5 MG tablet Take 0.5 mg by mouth every 8 (eight) hours as needed for anxiety.    Historical Provider, MD  magnesium hydroxide (MILK OF MAGNESIA) 400 MG/5ML suspension Take 30 mLs by mouth at bedtime as needed for mild constipation or moderate constipation. Reported on 01/02/2016    Historical Provider, MD  Melatonin 3 MG TABS Take 3 mg by mouth at bedtime.    Historical Provider, MD  mirtazapine (REMERON) 30 MG tablet Take 30 mg by mouth at bedtime.    Historical Provider, MD  NAMENDA XR 14 MG CP24 24 hr capsule Take 14 mg by mouth daily. 01/30/16   Historical Provider, MD  neomycin-bacitracin-polymyxin (NEOSPORIN) [phone] ointment Apply  1 application topically daily as needed (for skin tears, abrasions, or minor irritation).     Historical Provider, MD  Nutritional Supplements (GLUCERNA SHAKE PO) Take 1 each by mouth 3 (three) times daily. Mighty Shakes    Historical Provider, MD  QUEtiapine (SEROQUEL) 25 MG tablet Take 25 mg by mouth 2 (two) times daily.    Historical Provider, MD  tamsulosin (FLOMAX) 0.4 MG CAPS capsule Take 1 capsule (0.4 mg total) by mouth daily. 09/27/14   Brittainy M Simmons, PA-C   BP 146/85 mmHg  Pulse 71  Temp(Src) 98.3 F (36.8 C) (Oral)  Resp 22  SpO2 100% Physical Exam  Constitutional: He appears well-developed and well-nourished. No distress.  HENT:  Head: Normocephalic and atraumatic.  Eyes: Pupils are equal, round, and reactive to light.  Neck: Normal range of motion.  Cardiovascular: Normal rate and intact distal pulses.   Pulmonary/Chest: No respiratory distress.    Pain is reproducible with palpation where indicated  Abdominal: Normal appearance. He exhibits no distension.  Musculoskeletal: Normal range of motion.  Neurological: He is alert. No cranial nerve deficit.  Skin: Skin is warm and dry. No rash noted.  Psychiatric: He has a normal mood and affect. His behavior is normal.  Nursing note and vitals reviewed.   ED Course  Procedures (including critical care time) Labs Review Labs Reviewed  BASIC METABOLIC PANEL - Abnormal; Notable for the following:    BUN 21 (*)    Creatinine, Ser 1.35 (*)    GFR calc non Af Amer 49 (*)    GFR calc Af Amer 57 (*)    All other components within normal limits  CBC  I-STAT TROPOININ, ED    Imaging Review Dg Chest 2 View  03/21/2016  CLINICAL DATA:  Chest pain, wheezing, confusion EXAM: CHEST  2 VIEW COMPARISON:  03/04/2016 FINDINGS: Cardiomediastinal silhouette is stable. No acute infiltrate or pleural effusion. No pulmonary edema. Mild degenerative changes mid and lower thoracic spine. IMPRESSION: No active cardiopulmonary disease.  Electronically Signed   By: Natasha Mead M.D.   On: 03/21/2016 12:38   I have personally reviewed and evaluated these images and lab results as part of my medical decision-making.   EKG Interpretation   Date/Time:  Thursday March 21 2016 11:46:01 EDT Ventricular Rate:  62 PR Interval:  215 QRS Duration: 83 QT Interval:  416 QTC Calculation: 422 R Axis:   62 Text Interpretation:  Sinus rhythm Borderline prolonged PR interval  Probable anteroseptal infarct, old No significant change since last  tracing Confirmed by Marieanne Marxen  MD, Shivaay Stormont [phone]) on 03/21/2016 12:35:31 PM     After treatment in the ED the patient feels back to baseline and wants to go home. MDM   Final diagnoses:  Chest wall pain  Nelva Nayobert Anastasha Ortez, MD 03/21/16 1330

## 2016-06-05 ENCOUNTER — Emergency Department (HOSPITAL_COMMUNITY)
Admission: EM | Admit: 2016-06-05 | Discharge: 2016-06-05 | Disposition: A | Discharge status: Home / Self Care | Payer: Medicare Other | Attending: Emergency Medicine | Admitting: Emergency Medicine

## 2016-06-05 ENCOUNTER — Emergency Department (HOSPITAL_COMMUNITY): Payer: Medicare Other

## 2016-06-05 ENCOUNTER — Encounter (HOSPITAL_COMMUNITY): Payer: Self-pay | Admitting: Emergency Medicine

## 2016-06-05 DIAGNOSIS — M7989 Other specified soft tissue disorders: Secondary | ICD-10-CM | POA: Insufficient documentation

## 2016-06-05 DIAGNOSIS — I129 Hypertensive chronic kidney disease with stage 1 through stage 4 chronic kidney disease, or unspecified chronic kidney disease: Secondary | ICD-10-CM | POA: Insufficient documentation

## 2016-06-05 DIAGNOSIS — Z7982 Long term (current) use of aspirin: Secondary | ICD-10-CM | POA: Insufficient documentation

## 2016-06-05 DIAGNOSIS — W19XXXA Unspecified fall, initial encounter: Secondary | ICD-10-CM | POA: Insufficient documentation

## 2016-06-05 DIAGNOSIS — M25522 Pain in left elbow: Secondary | ICD-10-CM | POA: Diagnosis present

## 2016-06-05 DIAGNOSIS — F0390 Unspecified dementia without behavioral disturbance: Secondary | ICD-10-CM | POA: Insufficient documentation

## 2016-06-05 DIAGNOSIS — Y9389 Activity, other specified: Secondary | ICD-10-CM | POA: Insufficient documentation

## 2016-06-05 DIAGNOSIS — N183 Chronic kidney disease, stage 3 (moderate): Secondary | ICD-10-CM | POA: Insufficient documentation

## 2016-06-05 DIAGNOSIS — Y929 Unspecified place or not applicable: Secondary | ICD-10-CM | POA: Insufficient documentation

## 2016-06-05 DIAGNOSIS — Z79899 Other long term (current) drug therapy: Secondary | ICD-10-CM | POA: Insufficient documentation

## 2016-06-05 DIAGNOSIS — Y999 Unspecified external cause status: Secondary | ICD-10-CM | POA: Diagnosis not present

## 2016-06-05 MED ORDER — IBUPROFEN 200 MG PO TABS
400.0000 mg | ORAL_TABLET | Freq: Once | ORAL | Status: AC
  Administered 2016-06-05: 400 mg via ORAL
  Filled 2016-06-05: qty 2

## 2016-06-05 MED ORDER — OXYCODONE-ACETAMINOPHEN 5-325 MG PO TABS
1.0000 | ORAL_TABLET | Freq: Once | ORAL | Status: AC
  Administered 2016-06-05: 1 via ORAL
  Filled 2016-06-05: qty 1

## 2016-06-05 NOTE — ED Triage Notes (Signed)
Per EMS. [facility]. Hx of dementia, oriented per norm. Pt complains of L arm pain this am. Pt denies any falls, but roommate (who also has dementia) said pt fell last night and then got back up independently. Pain with movement and touching L arm.

## 2016-06-05 NOTE — ED Provider Notes (Signed)
[facility]-EMERGENCY DEPT Provider Note   CSN: [id number] Arrival date & time: 06/05/16  0735     History   Chief Complaint Chief Complaint  Patient presents with  . Arm Pain    HPI Jacob York is a 80 y.o. male.  Patient presents to the emergency department from the nursing facility with complaints of left elbow pain.  Patient does not remember a fall but per the patient's roommate the patient had a fall in the middle the night.  The patient has dementia and therefore cannot provide much additional history.  He reports his left elbow hurts and there is evidence of swelling on the medial epicondyle of the left elbow.  He denies neck pain.  He denies hip pain.  Nursing facility reports baseline mental status and no recent illness   The history is provided by the patient, medical records, the EMS personnel and the nursing home. History limited by: Dementia.    Past Medical History:  Diagnosis Date  . Dementia   . Hypertension   . Stroke (HCC)   . Urinary retention 09/22/2014    Patient Active Problem List   Diagnosis Date Noted  . Agitation 12/02/2015  . UTI (lower urinary tract infection) 03/03/2015  . Pre-syncope 10/15/2014  . H/O urinary retention 09/22/2014  . Bigeminy 09/21/2014  . Syncope 09/21/2014  . Bradycardia 09/21/2014  . Chronic renal insufficiency, stage III (moderate) 09/21/2014  . Ventral hernia 06/20/2014  . Bilateral inguinal hernia (BIH) L>>R 05/24/2014  . Incarcerated ventral hernia - 4cm supraumbilical 05/24/2014  . Diastasis recti 05/24/2014  . Memory loss 05/24/2014  . Dementia with behavioral disturbance   . Hypertension     Past Surgical History:  Procedure Laterality Date  . INGUINAL HERNIA REPAIR N/A 06/20/2014   Procedure: LAPAROSCOPIC BILATERAL INGUINAL HERNIA REPAIR, LEFT FEMORAL AND INCARCERATED SUPRA UMBILICAL HERNIA REPAIR WITH MESH;  Surgeon: Karie Soda, MD;  Location: [facility];  Service: General;  Laterality: N/A;  . INSERTION OF  MESH N/A 06/20/2014   Procedure: INSERTION OF MESH;  Surgeon: Karie Soda, MD;  Location: [facility] OR[facility];  Service: General;  Laterality: N/A;       Home Medications    Prior to Admission medications   Medication Sig Start Date End Date Taking? Authorizing Provider  acetaminophen (TYLENOL) 500 MG tablet Take 500 mg by mouth every 4 (four) hours as needed for mild pain, moderate pain, fever or headache.     Historical Provider, MD  alum & mag hydroxide-simeth (MAALOX PLUS) 400-400-40 MG/5ML suspension Take 30 mLs by mouth every 6 (six) hours as needed for indigestion.    Historical Provider, MD  aluminum-magnesium hydroxide-simethicone (MAALOX) 200-200-20 MG/5ML SUSP Take 30 mLs by mouth every 6 (six) hours as needed (heartburn/indigestion). Standing order    Historical Provider, MD  aspirin 81 MG chewable tablet Chew 81 mg by mouth daily.    Historical Provider, MD  cephALEXin (KEFLEX) 500 MG capsule Take 1 capsule (500 mg total) by mouth 4 (four) times daily. Patient not taking: Reported on 03/04/2016 02/15/16   Lorre Nick, MD  cetirizine (ZYRTEC) 10 MG tablet Take 10 mg by mouth daily as needed (itching).    Historical Provider, MD  cholecalciferol (VITAMIN D) 1000 UNITS tablet Take 1,000 Units by mouth daily.    Historical Provider, MD  divalproex (DEPAKOTE) 500 MG DR tablet Take 500 mg by mouth 2 (two) times daily.    Historical Provider, MD  guaifenesin (ROBITUSSIN) 100 MG/5ML syrup Take 200 mg by mouth  every 6 (six) hours as needed for cough.     Historical Provider, MD  loperamide (IMODIUM) 2 MG capsule Take 2 mg by mouth as needed for diarrhea or loose stools (with each loose stool (Do not exceed 8 doses in 24 hours).).     Historical Provider, MD  LORazepam (ATIVAN) 0.5 MG tablet Take 0.25 mg by mouth 3 (three) times daily.     Historical Provider, MD  LORazepam (ATIVAN) 0.5 MG tablet Take 0.5 mg by mouth every 8 (eight) hours as needed for anxiety.    Historical Provider, MD  magnesium  hydroxide (MILK OF MAGNESIA) 400 MG/5ML suspension Take 30 mLs by mouth at bedtime as needed for mild constipation or moderate constipation. Reported on 01/02/2016    Historical Provider, MD  Melatonin 3 MG TABS Take 3 mg by mouth at bedtime.    Historical Provider, MD  mirtazapine (REMERON) 30 MG tablet Take 30 mg by mouth at bedtime.    Historical Provider, MD  NAMENDA XR 14 MG CP24 24 hr capsule Take 14 mg by mouth daily. 01/30/16   Historical Provider, MD  neomycin-bacitracin-polymyxin (NEOSPORIN) [phone] ointment Apply 1 application topically daily as needed (for skin tears, abrasions, or minor irritation).     Historical Provider, MD  Nutritional Supplements (GLUCERNA SHAKE PO) Take 1 each by mouth 3 (three) times daily. Mighty Shakes    Historical Provider, MD  QUEtiapine (SEROQUEL) 25 MG tablet Take 25 mg by mouth 2 (two) times daily.    Historical Provider, MD  tamsulosin (FLOMAX) 0.4 MG CAPS capsule Take 1 capsule (0.4 mg total) by mouth 2 (two) times daily. 03/21/16   [name], MD    Family History Family History  Problem Relation Age of Onset  . Stroke Mother   . Hypertension Mother     Social History Social History  Substance Use Topics  . Smoking status: Never Smoker  . Smokeless tobacco: Never Used  . Alcohol use No     Allergies   Review of patient's allergies indicates no known allergies.   Review of Systems Review of Systems  Unable to perform ROS: Dementia     Physical Exam Updated Vital Signs BP 123/71   Pulse 63   Temp 98 F (36.7 C) (Oral)   Resp 16   SpO2 99%   Physical Exam  Constitutional: He appears well-developed and well-nourished.  HENT:  Head: Normocephalic and atraumatic.  Eyes: EOM are normal.  Neck: Normal range of motion. Neck supple.  No C-spine tenderness  Cardiovascular: Normal rate and regular rhythm.   Pulmonary/Chest: Effort normal and breath sounds normal. No respiratory distress.  Abdominal: Soft. He exhibits no  distension. There is no tenderness.  Musculoskeletal:  Bonita Quin range of motion of left elbow secondary to swelling.  There is tenderness and swelling of the left elbow medial condyle.  Normal left radial pulse.  Normal grip strength left hand.  Full range of motion of left wrist and left shoulder.  No abnormalities noted of the left clavicle.  Full range of motion of bilateral hips, knees, ankles.  No thoracic or lumbar point tenderness  Neurological: He is alert.  Skin: Skin is warm and dry.  Psychiatric: He has a normal mood and affect. Judgment normal.  Nursing note and vitals reviewed.    ED Treatments / Results  Labs (all labs ordered are listed, but only abnormal results are displayed) Labs Reviewed - No data to display  EKG  EKG Interpretation None  Radiology Dg Elbow Complete Left  Result Date: 06/05/2016 CLINICAL DATA:  Elbow pain.  No reported injury .  Dementia. EXAM: LEFT ELBOW - COMPLETE 3+ VIEW COMPARISON:  No recent prior. FINDINGS: Diffuse severe degenerative changes with loose bodies noted. No clearcut fracture noted. No dislocation. If symptoms persist follow-up imaging in 7-10 days suggested. IMPRESSION: Severe degenerative changes left elbow with loose bodies. No definite fracture identified. If symptoms persist follow-up imaging in 7-10 days suggested . Electronically Signed   By: Maisie Fushomas  Register   On: 06/05/2016 08:25    Procedures Procedures (including critical care time)  Medications Ordered in ED Medications  oxyCODONE-acetaminophen (PERCOCET/ROXICET) 5-325 MG per tablet 1 tablet (not administered)  ibuprofen (ADVIL,MOTRIN) tablet 400 mg (not administered)     Initial Impression / Assessment and Plan / ED Course  I have reviewed the triage vital signs and the nursing notes.  Pertinent labs & imaging results that were available during my care of the patient were reviewed by me and considered in my medical decision making (see chart for  details).  Clinical Course    No definite fracture identified.  Patient be placed in a sling for comfort.  Home with Tylenol for pain.  He will need to see the orthopedic surgeon for follow-up if his pain continues.  I think putting him in a posterior splint, given his advanced age and dementia, would cause more issues and therefore I have elected to stay with just a sling.  Final Clinical Impressions(s) / ED Diagnoses   Final diagnoses:  Fall, initial encounter  Left elbow pain    New Prescriptions New Prescriptions   No medications on file     Azalia BilisKevin Fairy Ashlock, MD 06/05/16 [phone]

## 2016-06-05 NOTE — ED Notes (Signed)
Bed: [id number] Expected date:  Expected time:  Means of arrival:  Comments: EMS- 80yo M, L arm pain/Hx of dementia

## 2016-06-05 NOTE — ED Notes (Signed)
MD at bedside. 

## 2016-06-16 ENCOUNTER — Emergency Department (HOSPITAL_COMMUNITY)
Admission: EM | Admit: 2016-06-16 | Discharge: 2016-06-17 | Disposition: A | Discharge status: Home / Self Care | Payer: Medicare Other | Attending: Emergency Medicine | Admitting: Emergency Medicine

## 2016-06-16 ENCOUNTER — Emergency Department (HOSPITAL_COMMUNITY): Payer: Medicare Other

## 2016-06-16 ENCOUNTER — Encounter (HOSPITAL_COMMUNITY): Payer: Self-pay | Admitting: Emergency Medicine

## 2016-06-16 DIAGNOSIS — Z7982 Long term (current) use of aspirin: Secondary | ICD-10-CM | POA: Diagnosis not present

## 2016-06-16 DIAGNOSIS — Y999 Unspecified external cause status: Secondary | ICD-10-CM | POA: Insufficient documentation

## 2016-06-16 DIAGNOSIS — N183 Chronic kidney disease, stage 3 (moderate): Secondary | ICD-10-CM | POA: Insufficient documentation

## 2016-06-16 DIAGNOSIS — Z043 Encounter for examination and observation following other accident: Secondary | ICD-10-CM | POA: Insufficient documentation

## 2016-06-16 DIAGNOSIS — W19XXXA Unspecified fall, initial encounter: Secondary | ICD-10-CM

## 2016-06-16 DIAGNOSIS — Z79899 Other long term (current) drug therapy: Secondary | ICD-10-CM | POA: Insufficient documentation

## 2016-06-16 DIAGNOSIS — Y9289 Other specified places as the place of occurrence of the external cause: Secondary | ICD-10-CM | POA: Insufficient documentation

## 2016-06-16 DIAGNOSIS — Y939 Activity, unspecified: Secondary | ICD-10-CM | POA: Insufficient documentation

## 2016-06-16 DIAGNOSIS — F0390 Unspecified dementia without behavioral disturbance: Secondary | ICD-10-CM | POA: Diagnosis not present

## 2016-06-16 DIAGNOSIS — I129 Hypertensive chronic kidney disease with stage 1 through stage 4 chronic kidney disease, or unspecified chronic kidney disease: Secondary | ICD-10-CM | POA: Diagnosis not present

## 2016-06-16 DIAGNOSIS — W01190A Fall on same level from slipping, tripping and stumbling with subsequent striking against furniture, initial encounter: Secondary | ICD-10-CM | POA: Diagnosis not present

## 2016-06-16 NOTE — ED Provider Notes (Signed)
[facility]-EMERGENCY DEPT Provider Note   CSN: [id number] Arrival date & time: 06/16/16  2228  By signing my name below, I, Alyssa Grove, attest that this documentation has been prepared under the direction and in the presence of Shon Baton, MD. Electronically Signed: Alyssa Grove, ED Scribe. 06/16/16. 11:42 PM.   History   Chief Complaint Chief Complaint  Patient presents with  . Fall   The history is provided by the patient and a caregiver. No language interpreter was used.    LEVEL 5 CAVEAT DUE TO DEMENTIA  HPI Comments: Jacob York is a 80 y.o. male with PMHx of Dementia, HTN, and Stroke who presents to the Emergency Department s/p a fall at assisted living facility earlier tonight. He states he is not currently experiencing any pain. He is unaware of what happened today. Per triage note, pt is from [facility]. He was observed in the activity room where he stumbled and fell, striking his head on the end table. Pt did not experience LOC. He is presently at Neurological baseline per facility staff. He does not currently take blood thinners.  Past Medical History:  Diagnosis Date  . Dementia   . Hypertension   . Stroke (HCC)   . Urinary retention 09/22/2014    Patient Active Problem List   Diagnosis Date Noted  . Agitation 12/02/2015  . UTI (lower urinary tract infection) 03/03/2015  . Pre-syncope 10/15/2014  . H/O urinary retention 09/22/2014  . Bigeminy 09/21/2014  . Syncope 09/21/2014  . Bradycardia 09/21/2014  . Chronic renal insufficiency, stage III (moderate) 09/21/2014  . Ventral hernia 06/20/2014  . Bilateral inguinal hernia (BIH) L>>R 05/24/2014  . Incarcerated ventral hernia - 4cm supraumbilical 05/24/2014  . Diastasis recti 05/24/2014  . Memory loss 05/24/2014  . Dementia with behavioral disturbance   . Hypertension     Past Surgical History:  Procedure Laterality Date  . INGUINAL HERNIA REPAIR N/A 06/20/2014   Procedure: LAPAROSCOPIC BILATERAL  INGUINAL HERNIA REPAIR, LEFT FEMORAL AND INCARCERATED SUPRA UMBILICAL HERNIA REPAIR WITH MESH;  Surgeon: Karie Soda, MD;  Location: [facility];  Service: General;  Laterality: N/A;  . INSERTION OF MESH N/A 06/20/2014   Procedure: INSERTION OF MESH;  Surgeon: Karie Soda, MD;  Location: [facility] OR[facility];  Service: General;  Laterality: N/A;       Home Medications    Prior to Admission medications   Medication Sig Start Date End Date Taking? Authorizing Provider  acetaminophen (TYLENOL) 500 MG tablet Take 500 mg by mouth every 4 (four) hours as needed for mild pain, moderate pain, fever or headache.    Yes Historical Provider, MD  aspirin 81 MG chewable tablet Chew 81 mg by mouth daily.   Yes Historical Provider, MD  cetirizine (ZYRTEC) 10 MG tablet Take 10 mg by mouth daily as needed (itching).   Yes Historical Provider, MD  cholecalciferol (VITAMIN D) 1000 UNITS tablet Take 1,000 Units by mouth daily.   Yes Historical Provider, MD  divalproex (DEPAKOTE) 500 MG DR tablet Take 500 mg by mouth 2 (two) times daily.   Yes Historical Provider, MD  guaifenesin (ROBITUSSIN) 100 MG/5ML syrup Take 200 mg by mouth every 6 (six) hours as needed for cough.    Yes Historical Provider, MD  loperamide (IMODIUM) 2 MG capsule Take 2 mg by mouth as needed for diarrhea or loose stools (with each loose stool (Do not exceed 8 doses in 24 hours).).    Yes Historical Provider, MD  LORazepam (ATIVAN) 0.5 MG tablet  Take 0.25 mg by mouth 3 (three) times daily.    Yes Historical Provider, MD  magnesium hydroxide (MILK OF MAGNESIA) 400 MG/5ML suspension Take 30 mLs by mouth at bedtime as needed for mild constipation or moderate constipation. Reported on 01/02/2016   Yes Historical Provider, MD  Melatonin 3 MG TABS Take 3 mg by mouth at bedtime.   Yes Historical Provider, MD  mirtazapine (REMERON) 30 MG tablet Take 30 mg by mouth at bedtime.   Yes Historical Provider, MD  NAMENDA XR 14 MG CP24 24 hr capsule Take 14 mg by mouth daily.  01/30/16  Yes Historical Provider, MD  neomycin-bacitracin-polymyxin (NEOSPORIN) [phone] ointment Apply 1 application topically daily as needed (for skin tears, abrasions, or minor irritation).    Yes Historical Provider, MD  Nutritional Supplements (GLUCERNA SHAKE PO) Take 1 each by mouth 3 (three) times daily. Mighty Shakes   Yes Historical Provider, MD  QUEtiapine (SEROQUEL) 25 MG tablet Take 25 mg by mouth 2 (two) times daily.   Yes Historical Provider, MD  tamsulosin (FLOMAX) 0.4 MG CAPS capsule Take 1 capsule (0.4 mg total) by mouth 2 (two) times daily. 03/21/16  Yes [name], MD  cephALEXin (KEFLEX) 500 MG capsule Take 1 capsule (500 mg total) by mouth 4 (four) times daily. Patient not taking: Reported on 03/04/2016 02/15/16   Lorre Nick, MD    Family History Family History  Problem Relation Age of Onset  . Stroke Mother   . Hypertension Mother     Social History Social History  Substance Use Topics  . Smoking status: Never Smoker  . Smokeless tobacco: Never Used  . Alcohol use No     Allergies   Review of patient's allergies indicates no known allergies.   Review of Systems Review of Systems  Unable to perform ROS: Dementia  Neurological: Negative for syncope.  All other systems reviewed and are negative.  Physical Exam Updated Vital Signs BP 134/71   Pulse (!) 59   Temp 98.9 F (37.2 C)   Resp 16   SpO2 100%   Physical Exam  Constitutional:  Elderly, lying in bed, fidgety  HENT:  Head: Normocephalic and atraumatic.  Right Ear: External ear normal.  Left Ear: External ear normal.  Nose: Nose normal.  Mouth/Throat: Oropharynx is clear and moist.  Eyes: Pupils are equal, round, and reactive to light.  Neck: Normal range of motion. Neck supple.  Cardiovascular: Normal rate, regular rhythm and normal heart sounds.   No murmur heard. Pulmonary/Chest: Effort normal and breath sounds normal. No respiratory distress. He has no wheezes.  Abdominal:  Soft. There is no tenderness.  Musculoskeletal: He exhibits no edema.  Normal range of motion of bilateral hips and knees, no obvious deformities  Neurological: He is alert.  Oriented only to self  Skin: Skin is warm and dry.  No obvious abrasions or contusions  Psychiatric:  Mumbles to himself, perseverates  Nursing note and vitals reviewed.    ED Treatments / Results  DIAGNOSTIC STUDIES: Oxygen Saturation is 100% on RA, normal by my interpretation.    Labs (all labs ordered are listed, but only abnormal results are displayed) Labs Reviewed  URINALYSIS, ROUTINE W REFLEX MICROSCOPIC (NOT AT [facility]) - Abnormal; Notable for the following:       Result Value   pH 8.5 (*)    All other components within normal limits    EKG  EKG Interpretation None       Radiology Ct Head Wo Contrast  Result Date: 06/17/2016 CLINICAL DATA:  Witnessed fall striking head on table. No loss of consciousness. EXAM: CT HEAD WITHOUT CONTRAST CT CERVICAL SPINE WITHOUT CONTRAST TECHNIQUE: Multidetector CT imaging of the head and cervical spine was performed following the standard protocol without intravenous contrast. Multiplanar CT image reconstructions of the cervical spine were also generated. COMPARISON:  Head CT 12/08/2015. Head and cervical spine CT 07/31/2015 FINDINGS: CT HEAD FINDINGS Brain: Stable atrophy, chronic small vessel ischemia, and encephalomalacia in the right frontal lobe. No evidence of acute infarction, hemorrhage, hydrocephalus, extra-axial collection or mass lesion/mass effect. Vascular: No hyperdense vessel or unexpected calcification. Atherosclerosis of skullbase vasculature. Skull: No acute fracture or focal lesion. Right frontal bone thinning is chronic and unchanged, possible postsurgical sequela. Sinuses/Orbits: No acute finding. CT CERVICAL SPINE FINDINGS Alignment: Minimal anterolisthesis of C5 on C6 stable. Previous minimal anterolisthesis of C4 on C5 has diminished. No acute  subluxation. Skull base and vertebrae: Stable partial bony fusion of C6-C7 vertebral bodies. No acute fracture. No vertebral body compression deformity. Degenerative change in the atlantodental articulation. Stable facet arthropathy throughout. Soft tissues and spinal canal: No prevertebral fluid or swelling. No visible canal hematoma. Disc levels:  Stable from prior. Upper chest: Negative. IMPRESSION: 1. No acute intracranial abnormality. Stable atrophy and chronic ischemia. 2. Stable degenerative change in the cervical spine without acute fracture or subluxation. Electronically Signed   By: Rubye OaksMelanie  Ehinger M.D.   On: 06/17/2016 02:39   Ct Cervical Spine Wo Contrast  Result Date: 06/17/2016 CLINICAL DATA:  Witnessed fall striking head on table. No loss of consciousness. EXAM: CT HEAD WITHOUT CONTRAST CT CERVICAL SPINE WITHOUT CONTRAST TECHNIQUE: Multidetector CT imaging of the head and cervical spine was performed following the standard protocol without intravenous contrast. Multiplanar CT image reconstructions of the cervical spine were also generated. COMPARISON:  Head CT 12/08/2015. Head and cervical spine CT 07/31/2015 FINDINGS: CT HEAD FINDINGS Brain: Stable atrophy, chronic small vessel ischemia, and encephalomalacia in the right frontal lobe. No evidence of acute infarction, hemorrhage, hydrocephalus, extra-axial collection or mass lesion/mass effect. Vascular: No hyperdense vessel or unexpected calcification. Atherosclerosis of skullbase vasculature. Skull: No acute fracture or focal lesion. Right frontal bone thinning is chronic and unchanged, possible postsurgical sequela. Sinuses/Orbits: No acute finding. CT CERVICAL SPINE FINDINGS Alignment: Minimal anterolisthesis of C5 on C6 stable. Previous minimal anterolisthesis of C4 on C5 has diminished. No acute subluxation. Skull base and vertebrae: Stable partial bony fusion of C6-C7 vertebral bodies. No acute fracture. No vertebral body compression  deformity. Degenerative change in the atlantodental articulation. Stable facet arthropathy throughout. Soft tissues and spinal canal: No prevertebral fluid or swelling. No visible canal hematoma. Disc levels:  Stable from prior. Upper chest: Negative. IMPRESSION: 1. No acute intracranial abnormality. Stable atrophy and chronic ischemia. 2. Stable degenerative change in the cervical spine without acute fracture or subluxation. Electronically Signed   By: Rubye OaksMelanie  Ehinger M.D.   On: 06/17/2016 02:39    Procedures Procedures (including critical care time)  Medications Ordered in ED Medications - No data to display   Initial Impression / Assessment and Plan / ED Course  I have reviewed the triage vital signs and the nursing notes.  Pertinent labs & imaging results that were available during my care of the patient were reviewed by me and considered in my medical decision making (see chart for details).  Clinical Course    Patient presents after a witnessed fall.  Hx of dementia.  Limited history.  No obvious injurys.  CT  head/neck reassuring.  UA clean.  Patient at baseline and ambulatory.  After history, exam, and medical workup I feel the patient has been appropriately medically screened and is safe for discharge home. Pertinent diagnoses were discussed with the patient. Patient was given return precautions.   Final Clinical Impressions(s) / ED Diagnoses   Final diagnoses:  Fall, initial encounter    New Prescriptions New Prescriptions   No medications on file   I personally performed the services described in this documentation, which was scribed in my presence. The recorded information has been reviewed and is accurate.     Shon Baton, MD 06/17/16 [phone]

## 2016-06-16 NOTE — ED Triage Notes (Addendum)
[facility]  He was in activity room when he stumbled and fell (observed) striking head on end table.  Pt is able to communicate though he does have Hx Dementia.  Negative LOC.  Pt states that he has no pain.  Pt is at neuro baseline presently per facility staff.  Pt ambulates at baseline.  Negative blood thinners.

## 2016-06-17 LAB — URINALYSIS, ROUTINE W REFLEX MICROSCOPIC
BILIRUBIN URINE: NEGATIVE
GLUCOSE, UA: NEGATIVE mg/dL
Hgb urine dipstick: NEGATIVE
KETONES UR: NEGATIVE mg/dL
Leukocytes, UA: NEGATIVE
NITRITE: NEGATIVE
PH: 8.5 — AB (ref 5.0–8.0)
PROTEIN: NEGATIVE mg/dL
Specific Gravity, Urine: 1.013 (ref 1.005–1.030)

## 2016-06-17 NOTE — Discharge Instructions (Signed)
Patient was seen today following a fall. His workup is reassuring.

## 2016-06-17 NOTE — ED Notes (Signed)
[facility], left a message.

## 2016-06-27 ENCOUNTER — Emergency Department (HOSPITAL_COMMUNITY)
Admission: EM | Admit: 2016-06-27 | Discharge: 2016-06-27 | Disposition: A | Discharge status: Home / Self Care | Payer: Medicare Other | Attending: Emergency Medicine | Admitting: Emergency Medicine

## 2016-06-27 ENCOUNTER — Emergency Department (HOSPITAL_COMMUNITY): Payer: Medicare Other

## 2016-06-27 ENCOUNTER — Encounter (HOSPITAL_COMMUNITY): Payer: Self-pay | Admitting: Emergency Medicine

## 2016-06-27 DIAGNOSIS — Z7982 Long term (current) use of aspirin: Secondary | ICD-10-CM | POA: Diagnosis not present

## 2016-06-27 DIAGNOSIS — N183 Chronic kidney disease, stage 3 (moderate): Secondary | ICD-10-CM | POA: Diagnosis not present

## 2016-06-27 DIAGNOSIS — I129 Hypertensive chronic kidney disease with stage 1 through stage 4 chronic kidney disease, or unspecified chronic kidney disease: Secondary | ICD-10-CM | POA: Diagnosis not present

## 2016-06-27 DIAGNOSIS — Z8673 Personal history of transient ischemic attack (TIA), and cerebral infarction without residual deficits: Secondary | ICD-10-CM | POA: Insufficient documentation

## 2016-06-27 DIAGNOSIS — R531 Weakness: Secondary | ICD-10-CM | POA: Insufficient documentation

## 2016-06-27 DIAGNOSIS — R791 Abnormal coagulation profile: Secondary | ICD-10-CM | POA: Insufficient documentation

## 2016-06-27 DIAGNOSIS — R410 Disorientation, unspecified: Secondary | ICD-10-CM | POA: Insufficient documentation

## 2016-06-27 LAB — CBC
HCT: 44.1 % (ref 39.0–52.0)
HCT: 43.3 % (ref 39.0–52.0)
Hemoglobin: 15.1 g/dL (ref 13.0–17.0)
Hemoglobin: 15.4 g/dL (ref 13.0–17.0)
MCH: 30.9 pg (ref 26.0–34.0)
MCH: 31.2 pg (ref 26.0–34.0)
MCHC: 34.2 g/dL (ref 30.0–36.0)
MCHC: 35.6 g/dL (ref 30.0–36.0)
MCV: 90.2 fL (ref 78.0–100.0)
MCV: 87.8 fL (ref 78.0–100.0)
PLATELETS: 179 10*3/uL (ref 150–400)
Platelets: 183 10*3/uL (ref 150–400)
RBC: 4.89 MIL/uL (ref 4.22–5.81)
RBC: 4.93 MIL/uL (ref 4.22–5.81)
RDW: 13.1 % (ref 11.5–15.5)
RDW: 13.3 % (ref 11.5–15.5)
WBC: 8.5 10*3/uL (ref 4.0–10.5)
WBC: 8.4 10*3/uL (ref 4.0–10.5)

## 2016-06-27 LAB — APTT: APTT: 28 s (ref 24–36)

## 2016-06-27 LAB — URINALYSIS, ROUTINE W REFLEX MICROSCOPIC
Bilirubin Urine: NEGATIVE
GLUCOSE, UA: NEGATIVE mg/dL
HGB URINE DIPSTICK: NEGATIVE
Ketones, ur: NEGATIVE mg/dL
Leukocytes, UA: NEGATIVE
Nitrite: NEGATIVE
PH: 8 (ref 5.0–8.0)
Protein, ur: NEGATIVE mg/dL
SPECIFIC GRAVITY, URINE: 1.015 (ref 1.005–1.030)

## 2016-06-27 LAB — PROTIME-INR
INR: 1.04
Prothrombin Time: 13.6 seconds (ref 11.4–15.2)

## 2016-06-27 LAB — COMPREHENSIVE METABOLIC PANEL
ALT: 13 U/L — ABNORMAL LOW (ref 17–63)
ANION GAP: 12 (ref 5–15)
AST: 22 U/L (ref 15–41)
Albumin: 4.1 g/dL (ref 3.5–5.0)
Alkaline Phosphatase: 52 U/L (ref 38–126)
BUN: 24 mg/dL — AB (ref 6–20)
CALCIUM: 9.1 mg/dL (ref 8.9–10.3)
CHLORIDE: 111 mmol/L (ref 101–111)
CO2: 18 mmol/L — ABNORMAL LOW (ref 22–32)
CREATININE: 1.23 mg/dL (ref 0.61–1.24)
GFR calc Af Amer: >60 mL/min (ref >60)
GFR, EST NON AFRICAN AMERICAN: 54 mL/min — AB (ref >60)
GLUCOSE: 88 mg/dL (ref 65–99)
POTASSIUM: 3.6 mmol/L (ref 3.5–5.1)
Sodium: 141 mmol/L (ref 135–145)
Total Bilirubin: 1.1 mg/dL (ref 0.3–1.2)
Total Protein: 6.8 g/dL (ref 6.5–8.1)

## 2016-06-27 LAB — BASIC METABOLIC PANEL
Anion gap: 13 (ref 5–15)
BUN: 23 mg/dL — AB (ref 6–20)
CO2: 18 mmol/L — ABNORMAL LOW (ref 22–32)
CREATININE: 1.27 mg/dL — AB (ref 0.61–1.24)
Calcium: 9.2 mg/dL (ref 8.9–10.3)
Chloride: 111 mmol/L (ref 101–111)
GFR calc Af Amer: >60 mL/min (ref >60)
GFR, EST NON AFRICAN AMERICAN: 52 mL/min — AB (ref >60)
Glucose, Bld: 94 mg/dL (ref 65–99)
Potassium: 3.6 mmol/L (ref 3.5–5.1)
SODIUM: 142 mmol/L (ref 135–145)

## 2016-06-27 LAB — DIFFERENTIAL
BASOS ABS: 0 10*3/uL (ref 0.0–0.1)
BASOS PCT: 0 %
EOS ABS: 0.1 10*3/uL (ref 0.0–0.7)
Eosinophils Relative: 1 %
Lymphocytes Relative: 19 %
Lymphs Abs: 1.6 10*3/uL (ref 0.7–4.0)
MONOS PCT: 6 %
Monocytes Absolute: 0.5 10*3/uL (ref 0.1–1.0)
NEUTROS PCT: 74 %
Neutro Abs: 6.2 10*3/uL (ref 1.7–7.7)

## 2016-06-27 LAB — I-STAT TROPONIN, ED: Troponin i, poc: 0 ng/mL (ref 0.00–0.08)

## 2016-06-27 LAB — CBG MONITORING, ED: GLUCOSE-CAPILLARY: 85 mg/dL (ref 65–99)

## 2016-06-27 MED ORDER — SODIUM CHLORIDE 0.9 % IV BOLUS (SEPSIS)
500.0000 mL | Freq: Once | INTRAVENOUS | Status: AC
  Administered 2016-06-27: 500 mL via INTRAVENOUS

## 2016-06-27 NOTE — ED Notes (Signed)
Called [facility] for transport.  

## 2016-06-27 NOTE — Discharge Instructions (Signed)
Follow-up with your primary care doctor. I recommend a repeat electrolyte panel in the next week. Return to the emergency room for fever, vomiting, worsening symptoms.

## 2016-06-27 NOTE — ED Provider Notes (Signed)
[facility]-EMERGENCY DEPT Provider Note   CSN: [id number] Arrival date & time: 06/27/16  1318     History   Chief Complaint Chief Complaint  Patient presents with  . Weakness    HPI Jacob York is a 80 y.o. male.  HPI Pt presented to the ED via EMS from [facility].  Pt was sent over for evaluation because of generalized weakness.  Pt has a history of dementia.  He  states he feels fine.  Pt denies any complaints.  He denies headache, chest pain, abdominal pain, nausea or vomiting.  No fevers or chills or coughing. Past Medical History:  Diagnosis Date  . Dementia   . Hypertension   . Stroke (HCC)   . Urinary retention 09/22/2014    Patient Active Problem List   Diagnosis Date Noted  . Agitation 12/02/2015  . UTI (lower urinary tract infection) 03/03/2015  . Pre-syncope 10/15/2014  . H/O urinary retention 09/22/2014  . Bigeminy 09/21/2014  . Syncope 09/21/2014  . Bradycardia 09/21/2014  . Chronic renal insufficiency, stage III (moderate) 09/21/2014  . Ventral hernia 06/20/2014  . Bilateral inguinal hernia (BIH) L>>R 05/24/2014  . Incarcerated ventral hernia - 4cm supraumbilical 05/24/2014  . Diastasis recti 05/24/2014  . Memory loss 05/24/2014  . Dementia with behavioral disturbance   . Hypertension     Past Surgical History:  Procedure Laterality Date  . INGUINAL HERNIA REPAIR N/A 06/20/2014   Procedure: LAPAROSCOPIC BILATERAL INGUINAL HERNIA REPAIR, LEFT FEMORAL AND INCARCERATED SUPRA UMBILICAL HERNIA REPAIR WITH MESH;  Surgeon: Karie Soda, MD;  Location: [facility] ORS;  Service: General;  Laterality: N/A;  . INSERTION OF MESH N/A 06/20/2014   Procedure: INSERTION OF MESH;  Surgeon: Karie Soda, MD;  Location: [facility] ORS;  Service: General;  Laterality: N/A;       Home Medications    Prior to Admission medications   Medication Sig Start Date End Date Taking? Authorizing Provider  acetaminophen (TYLENOL) 500 MG tablet Take 500 mg by mouth every 4 (four) hours as  needed for mild pain, moderate pain, fever or headache.    Yes Historical Provider, MD  alum & mag hydroxide-simeth (MINTOX) 200-200-20 MG/5ML suspension Take 30 mLs by mouth every 6 (six) hours as needed for indigestion or heartburn.   Yes Historical Provider, MD  aspirin 81 MG chewable tablet Chew 81 mg by mouth daily.   Yes Historical Provider, MD  cetirizine (ZYRTEC) 10 MG tablet Take 10 mg by mouth daily as needed (itching).   Yes Historical Provider, MD  cholecalciferol (VITAMIN D) 1000 UNITS tablet Take 1,000 Units by mouth daily.   Yes Historical Provider, MD  divalproex (DEPAKOTE) 500 MG DR tablet Take 500 mg by mouth 2 (two) times daily.   Yes Historical Provider, MD  guaifenesin (ROBITUSSIN) 100 MG/5ML syrup Take 200 mg by mouth every 6 (six) hours as needed for cough.    Yes Historical Provider, MD  loperamide (IMODIUM) 2 MG capsule Take 2 mg by mouth as needed for diarrhea or loose stools (with each loose stool (Do not exceed 8 doses in 24 hours).).    Yes Historical Provider, MD  LORazepam (ATIVAN) 0.5 MG tablet Take 0.25 mg by mouth 3 (three) times daily.    Yes Historical Provider, MD  LORazepam (ATIVAN) 0.5 MG tablet Take 0.5 mg by mouth every 8 (eight) hours as needed for anxiety.   Yes Historical Provider, MD  magnesium hydroxide (MILK OF MAGNESIA) 400 MG/5ML suspension Take 30 mLs by mouth at bedtime  as needed for mild constipation or moderate constipation. Reported on 01/02/2016   Yes Historical Provider, MD  Melatonin 3 MG TABS Take 3 mg by mouth at bedtime.   Yes Historical Provider, MD  mirtazapine (REMERON) 30 MG tablet Take 30 mg by mouth at bedtime.   Yes Historical Provider, MD  NAMENDA XR 14 MG CP24 24 hr capsule Take 14 mg by mouth daily. 01/30/16  Yes Historical Provider, MD  neomycin-bacitracin-polymyxin (NEOSPORIN) [phone] ointment Apply 1 application topically daily as needed (for skin tears, abrasions, or minor irritation).    Yes Historical Provider, MD    Nutritional Supplements (GLUCERNA SHAKE PO) Take 1 each by mouth 3 (three) times daily. Mighty Shakes   Yes Historical Provider, MD  QUEtiapine (SEROQUEL) 25 MG tablet Take 25 mg by mouth 2 (two) times daily.   Yes Historical Provider, MD  Skin Protectants, Misc. (MINERIN) CREA Apply 1 application topically daily as needed (dry skin).   Yes Historical Provider, MD  tamsulosin (FLOMAX) 0.4 MG CAPS capsule Take 1 capsule (0.4 mg total) by mouth 2 (two) times daily. 03/21/16  Yes [name], MD  cephALEXin (KEFLEX) 500 MG capsule Take 1 capsule (500 mg total) by mouth 4 (four) times daily. Patient not taking: Reported on 06/27/2016 02/15/16   Lorre Nick, MD    Family History Family History  Problem Relation Age of Onset  . Stroke Mother   . Hypertension Mother     Social History Social History  Substance Use Topics  . Smoking status: Never Smoker  . Smokeless tobacco: Never Used  . Alcohol use No     Allergies   Review of patient's allergies indicates no known allergies.   Review of Systems Review of Systems  All other systems reviewed and are negative.    Physical Exam Updated Vital Signs BP 161/87 (BP Location: Left Arm)   Pulse 68   Temp 97.9 F (36.6 C)   Resp 18   SpO2 100%   Physical Exam  Constitutional: He appears well-developed and well-nourished. No distress.  HENT:  Head: Normocephalic and atraumatic.  Right Ear: External ear normal.  Left Ear: External ear normal.  Eyes: Conjunctivae are normal. Right eye exhibits no discharge. Left eye exhibits no discharge. No scleral icterus.  Neck: Neck supple. No tracheal deviation present.  Cardiovascular: Normal rate, regular rhythm and intact distal pulses.   Pulmonary/Chest: Effort normal and breath sounds normal. No stridor. No respiratory distress. He has no wheezes. He has no rales.  Abdominal: Soft. Bowel sounds are normal. He exhibits no distension. There is no tenderness. There is no rebound and no  guarding.  Musculoskeletal: He exhibits no edema or tenderness.  Neurological: He is alert. He has normal strength. He is disoriented. No cranial nerve deficit (no facial droop, extraocular movements intact, no slurred speech) or sensory deficit. He exhibits normal muscle tone. He displays no seizure activity. Coordination normal.  Confused with neurological exam, able to move all extremities, when asked to squeeze my hand he will point to his left hand with his right hand and touch the tip of his fingers, when answering where his is right now he will nod yes and laugh  Skin: Skin is warm and dry. No rash noted.  Psychiatric: He has a normal mood and affect.  Nursing note and vitals reviewed.    ED Treatments / Results  Labs (all labs ordered are listed, but only abnormal results are displayed) Labs Reviewed  BASIC METABOLIC PANEL - Abnormal; Notable  for the following:       Result Value   CO2 18 (*)    BUN 23 (*)    Creatinine, Ser 1.27 (*)    GFR calc non Af Amer 52 (*)    All other components within normal limits  COMPREHENSIVE METABOLIC PANEL - Abnormal; Notable for the following:    CO2 18 (*)    BUN 24 (*)    ALT 13 (*)    GFR calc non Af Amer 54 (*)    All other components within normal limits  CBC  PROTIME-INR  APTT  CBC  DIFFERENTIAL  URINALYSIS, ROUTINE W REFLEX MICROSCOPIC (NOT AT [facility])  CBG MONITORING, ED  CBG MONITORING, ED  I-STAT TROPOININ, ED    EKG  EKG Interpretation  Date/Time:  Thursday June 27 2016 14:04:02 EDT Ventricular Rate:  84 PR Interval:    QRS Duration: 90 QT Interval:  435 QTC Calculation: 413 R Axis:   42 Text Interpretation:  Sinus rhythm borderline prolonged pr Ventricular bigeminy , new since last tracing Probable anteroseptal infarct, old Confirmed by Sherika Kubicki  MD-J, Tamitha Norell ([id number]) on 06/27/2016 3:33:01 PM       Radiology Ct Head Wo Contrast  Result Date: 06/27/2016 CLINICAL DATA:  Generalized weakness, confusion, history of  stroke EXAM: CT HEAD WITHOUT CONTRAST TECHNIQUE: Contiguous axial images were obtained from the base of the skull through the vertex without intravenous contrast. COMPARISON:  06/17/2016 FINDINGS: Brain: No intracranial hemorrhage, mass effect or midline shift. Stable cerebral atrophy. No acute cortical infarction. Stable old infarct and encephalomalacia in right frontal lobe. No mass lesion is noted on this unenhanced scan. Again noted periventricular chronic white matter disease. Vascular: Atherosclerotic calcifications of carotid siphon. Skull: Normal. Negative for fracture or focal lesion. Sinuses/Orbits: No acute finding. Other: None. IMPRESSION: No acute intracranial abnormality. Stable atrophy and chronic white matter disease. Stable old infarct and encephalomalacia in right frontal lobe. Electronically Signed   By: Natasha MeadLiviu  Pop M.D.   On: 06/27/2016 14:51   Dg Chest Port 1 View  Result Date: 06/27/2016 CLINICAL DATA:  Confusion EXAM: PORTABLE CHEST 1 VIEW COMPARISON:  03/21/2016 FINDINGS: The heart size and mediastinal contours are within normal limits. Both lungs are clear. The visualized skeletal structures are unremarkable. IMPRESSION: No active disease. Electronically Signed   By: Marlan Palauharles  Clark M.D.   On: 06/27/2016 16:01    Procedures Procedures (including critical care time)  Medications Ordered in ED Medications  sodium chloride 0.9 % bolus 500 mL (500 mLs Intravenous New Bag/Given 06/27/16 1740)  sodium chloride 0.9 % bolus 500 mL (500 mLs Intravenous New Bag/Given 06/27/16 1740)     Initial Impression / Assessment and Plan / ED Course  I have reviewed the triage vital signs and the nursing notes.  Pertinent labs & imaging results that were available during my care of the patient were reviewed by me and considered in my medical decision making (see chart for details).  Clinical Course  Value Comment By Time  Sodium: 141 (Reviewed) Linwood DibblesJon Barrett Goldie, MD 09/21 1625   Bicarb decreased.   Normal anioin gap.  Fluid bolus ordered.  Initial labs reassuring Linwood DibblesJon Shaheen Mende, MD 09/21 1627  APTT: 28 (Reviewed) Linwood DibblesJon Suheyla Mortellaro, MD 09/21 1654   Pt was able to stand and walk in the ED. Linwood DibblesJon Ceria Suminski, MD 09/21 1747    The patient's workup in the ED is reassuring. His electrolytes did show a decreased bicarbonate level. The patient was given an IV fluid bolus. This will  require routine follow-up. He was able to walk in the emergency room. I doubt acute stroke. There does not appear to be an acute infection. Patient will be discharged back to the nursing facility.  Final Clinical Impressions(s) / ED Diagnoses   Final diagnoses:  Confusion    New Prescriptions New Prescriptions   No medications on file     Linwood Dibbles, MD 06/27/16 1750

## 2016-06-27 NOTE — ED Triage Notes (Signed)
[facility],  Generalized weakness today per staff pt is not as active as normal.Hx dementia. oriented to baseline per staff. NAD.

## 2016-06-27 NOTE — ED Notes (Signed)
Bed: [id number] Expected date:  Expected time:  Means of arrival:  Comments: lethargy

## 2016-06-27 NOTE — ED Notes (Addendum)
Pt stood up without assistance , ambulated in room and asked to go to restroom. Pt  Pulled his IV . Unable to obtain sample , voided in brief.

## 2016-10-22 ENCOUNTER — Emergency Department (HOSPITAL_COMMUNITY)
Admission: EM | Admit: 2016-10-22 | Discharge: 2016-10-22 | Disposition: A | Discharge status: Home / Self Care | Payer: Medicare Other | Attending: Emergency Medicine | Admitting: Emergency Medicine

## 2016-10-22 ENCOUNTER — Emergency Department (HOSPITAL_COMMUNITY): Payer: Medicare Other

## 2016-10-22 ENCOUNTER — Encounter (HOSPITAL_COMMUNITY): Payer: Self-pay | Admitting: Emergency Medicine

## 2016-10-22 DIAGNOSIS — Y939 Activity, unspecified: Secondary | ICD-10-CM | POA: Diagnosis not present

## 2016-10-22 DIAGNOSIS — N183 Chronic kidney disease, stage 3 (moderate): Secondary | ICD-10-CM | POA: Insufficient documentation

## 2016-10-22 DIAGNOSIS — Z048 Encounter for examination and observation for other specified reasons: Secondary | ICD-10-CM | POA: Insufficient documentation

## 2016-10-22 DIAGNOSIS — Y929 Unspecified place or not applicable: Secondary | ICD-10-CM | POA: Diagnosis not present

## 2016-10-22 DIAGNOSIS — I129 Hypertensive chronic kidney disease with stage 1 through stage 4 chronic kidney disease, or unspecified chronic kidney disease: Secondary | ICD-10-CM | POA: Insufficient documentation

## 2016-10-22 DIAGNOSIS — Z79899 Other long term (current) drug therapy: Secondary | ICD-10-CM | POA: Diagnosis not present

## 2016-10-22 DIAGNOSIS — Y999 Unspecified external cause status: Secondary | ICD-10-CM | POA: Insufficient documentation

## 2016-10-22 DIAGNOSIS — W19XXXA Unspecified fall, initial encounter: Secondary | ICD-10-CM

## 2016-10-22 DIAGNOSIS — Z7982 Long term (current) use of aspirin: Secondary | ICD-10-CM | POA: Insufficient documentation

## 2016-10-22 DIAGNOSIS — R410 Disorientation, unspecified: Secondary | ICD-10-CM | POA: Diagnosis not present

## 2016-10-22 DIAGNOSIS — Z8673 Personal history of transient ischemic attack (TIA), and cerebral infarction without residual deficits: Secondary | ICD-10-CM | POA: Diagnosis not present

## 2016-10-22 DIAGNOSIS — W1830XA Fall on same level, unspecified, initial encounter: Secondary | ICD-10-CM | POA: Diagnosis not present

## 2016-10-22 LAB — CBC WITH DIFFERENTIAL/PLATELET
Basophils Absolute: 0 10*3/uL (ref 0.0–0.1)
Basophils Relative: 0 %
EOS ABS: 0.1 10*3/uL (ref 0.0–0.7)
EOS PCT: 1 %
HCT: 42.8 % (ref 39.0–52.0)
Hemoglobin: 14.6 g/dL (ref 13.0–17.0)
LYMPHS ABS: 1.2 10*3/uL (ref 0.7–4.0)
LYMPHS PCT: 16 %
MCH: 30.6 pg (ref 26.0–34.0)
MCHC: 34.1 g/dL (ref 30.0–36.0)
MCV: 89.7 fL (ref 78.0–100.0)
MONO ABS: 0.5 10*3/uL (ref 0.1–1.0)
MONOS PCT: 6 %
Neutro Abs: 5.9 10*3/uL (ref 1.7–7.7)
Neutrophils Relative %: 77 %
PLATELETS: 157 10*3/uL (ref 150–400)
RBC: 4.77 MIL/uL (ref 4.22–5.81)
RDW: 13.5 % (ref 11.5–15.5)
WBC: 7.8 10*3/uL (ref 4.0–10.5)

## 2016-10-22 LAB — BASIC METABOLIC PANEL
Anion gap: 8 (ref 5–15)
BUN: 31 mg/dL — AB (ref 6–20)
CHLORIDE: 109 mmol/L (ref 101–111)
CO2: 24 mmol/L (ref 22–32)
CREATININE: 1.14 mg/dL (ref 0.61–1.24)
Calcium: 8.8 mg/dL — ABNORMAL LOW (ref 8.9–10.3)
GFR calc Af Amer: >60 mL/min (ref >60)
GFR, EST NON AFRICAN AMERICAN: 60 mL/min — AB (ref >60)
GLUCOSE: 98 mg/dL (ref 65–99)
Potassium: 3.5 mmol/L (ref 3.5–5.1)
SODIUM: 141 mmol/L (ref 135–145)

## 2016-10-22 LAB — CBG MONITORING, ED: Glucose-Capillary: 98 mg/dL (ref 65–99)

## 2016-10-22 NOTE — ED Notes (Signed)
Bed: [facility] Expected date:  Expected time:  Means of arrival:  Comments: EMS-fall 

## 2016-10-22 NOTE — ED Triage Notes (Signed)
Per EMS[facility], staff reports unwitnessed fall, patient found in floor. Denies head injury and LOC. Patient has no complaints. Hx dementia. Oriented at baseline.

## 2016-10-22 NOTE — Discharge Instructions (Signed)
Follow up with your primary care doctor, monitor for fever, worsening symptoms

## 2016-10-22 NOTE — ED Notes (Signed)
[facility] called for transport.  

## 2016-10-22 NOTE — ED Notes (Signed)
Patient given crackers and water

## 2016-10-22 NOTE — ED Notes (Signed)
ED Provider at bedside. 

## 2016-10-23 NOTE — ED Provider Notes (Signed)
[facility]-EMERGENCY DEPT Provider Note   CSN: [id number] Arrival date & time: 10/22/16  1222     History   Chief Complaint Chief Complaint  Patient presents with  . Fall    HPI Jacob York is a 81 y.o. male.  HPI Level V caveat: Dementia.  Patient presented to the emergency room for evaluation of fall at nursing facility.  The patient was found on the floor in his room. There is no evidence of any external injury but the patient was unable to say why he was on the ground. Patient does have a history of dementia. He denies any complaints of any chest pain, shortness of breath, abdominal pain. No headaches. He denies any neck pain, numbness or weakness. Past Medical History:  Diagnosis Date  . Dementia   . Hypertension   . Stroke (HCC)   . Urinary retention 09/22/2014    Patient Active Problem List   Diagnosis Date Noted  . Agitation 12/02/2015  . UTI (lower urinary tract infection) 03/03/2015  . Pre-syncope 10/15/2014  . H/O urinary retention 09/22/2014  . Bigeminy 09/21/2014  . Syncope 09/21/2014  . Bradycardia 09/21/2014  . Chronic renal insufficiency, stage III (moderate) 09/21/2014  . Ventral hernia 06/20/2014  . Bilateral inguinal hernia (BIH) L>>R 05/24/2014  . Incarcerated ventral hernia - 4cm supraumbilical 05/24/2014  . Diastasis recti 05/24/2014  . Memory loss 05/24/2014  . Dementia with behavioral disturbance   . Hypertension     Past Surgical History:  Procedure Laterality Date  . INGUINAL HERNIA REPAIR N/A 06/20/2014   Procedure: LAPAROSCOPIC BILATERAL INGUINAL HERNIA REPAIR, LEFT FEMORAL AND INCARCERATED SUPRA UMBILICAL HERNIA REPAIR WITH MESH;  Surgeon: Karie Soda, MD;  Location: [facility];  Service: General;  Laterality: N/A;  . INSERTION OF MESH N/A 06/20/2014   Procedure: INSERTION OF MESH;  Surgeon: Karie Soda, MD;  Location: [facility];  Service: General;  Laterality: N/A;       Home Medications    Prior to Admission medications   Medication  Sig Start Date End Date Taking? Authorizing Provider  acetaminophen (TYLENOL) 500 MG tablet Take 500 mg by mouth every 4 (four) hours as needed for mild pain, moderate pain, fever or headache.     Historical Provider, MD  alum & mag hydroxide-simeth (MINTOX) 200-200-20 MG/5ML suspension Take 30 mLs by mouth every 6 (six) hours as needed for indigestion or heartburn.    Historical Provider, MD  aspirin 81 MG chewable tablet Chew 81 mg by mouth daily.    Historical Provider, MD  cephALEXin (KEFLEX) 500 MG capsule Take 1 capsule (500 mg total) by mouth 4 (four) times daily. Patient not taking: Reported on 06/27/2016 02/15/16   Lorre Nick, MD  cetirizine (ZYRTEC) 10 MG tablet Take 10 mg by mouth daily as needed (itching).    Historical Provider, MD  cholecalciferol (VITAMIN D) 1000 UNITS tablet Take 1,000 Units by mouth daily.    Historical Provider, MD  divalproex (DEPAKOTE) 500 MG DR tablet Take 500 mg by mouth 2 (two) times daily.    Historical Provider, MD  guaifenesin (ROBITUSSIN) 100 MG/5ML syrup Take 200 mg by mouth every 6 (six) hours as needed for cough.     Historical Provider, MD  loperamide (IMODIUM) 2 MG capsule Take 2 mg by mouth as needed for diarrhea or loose stools (with each loose stool (Do not exceed 8 doses in 24 hours).).     Historical Provider, MD  LORazepam (ATIVAN) 0.5 MG tablet Take 0.25 mg by mouth  3 (three) times daily.     Historical Provider, MD  LORazepam (ATIVAN) 0.5 MG tablet Take 0.5 mg by mouth every 8 (eight) hours as needed for anxiety.    Historical Provider, MD  magnesium hydroxide (MILK OF MAGNESIA) 400 MG/5ML suspension Take 30 mLs by mouth at bedtime as needed for mild constipation or moderate constipation. Reported on 01/02/2016    Historical Provider, MD  Melatonin 3 MG TABS Take 3 mg by mouth at bedtime.    Historical Provider, MD  mirtazapine (REMERON) 30 MG tablet Take 30 mg by mouth at bedtime.    Historical Provider, MD  NAMENDA XR 14 MG CP24 24 hr capsule  Take 14 mg by mouth daily. 01/30/16   Historical Provider, MD  neomycin-bacitracin-polymyxin (NEOSPORIN) [phone] ointment Apply 1 application topically daily as needed (for skin tears, abrasions, or minor irritation).     Historical Provider, MD  Nutritional Supplements (GLUCERNA SHAKE PO) Take 1 each by mouth 3 (three) times daily. Mighty Shakes    Historical Provider, MD  QUEtiapine (SEROQUEL) 25 MG tablet Take 25 mg by mouth 2 (two) times daily.    Historical Provider, MD  Skin Protectants, Misc. (MINERIN) CREA Apply 1 application topically daily as needed (dry skin).    Historical Provider, MD  tamsulosin (FLOMAX) 0.4 MG CAPS capsule Take 1 capsule (0.4 mg total) by mouth 2 (two) times daily. 03/21/16   [name], MD    Family History Family History  Problem Relation Age of Onset  . Stroke Mother   . Hypertension Mother     Social History Social History  Substance Use Topics  . Smoking status: Never Smoker  . Smokeless tobacco: Never Used  . Alcohol use No     Allergies   Patient has no known allergies.   Review of Systems Review of Systems  All other systems reviewed and are negative.    Physical Exam Updated Vital Signs BP 134/79 (BP Location: Left Arm)   Pulse 62   Temp 98.1 F (36.7 C) (Oral)   Resp 18   SpO2 99%   Physical Exam  Constitutional: He appears well-developed and well-nourished. No distress.  HENT:  Head: Normocephalic and atraumatic.  Right Ear: External ear normal.  Left Ear: External ear normal.  Eyes: Conjunctivae are normal. Right eye exhibits no discharge. Left eye exhibits no discharge. No scleral icterus.  Neck: Neck supple. No tracheal deviation present.  Cardiovascular: Normal rate, regular rhythm and intact distal pulses.   Pulmonary/Chest: Effort normal and breath sounds normal. No stridor. No respiratory distress. He has no wheezes. He has no rales.  Abdominal: Soft. Bowel sounds are normal. He exhibits no distension. There is  no tenderness. There is no rebound and no guarding.  Musculoskeletal: He exhibits no edema or tenderness.       Right shoulder: He exhibits no tenderness, no bony tenderness and no swelling.       Left shoulder: He exhibits no tenderness, no bony tenderness and no swelling.       Right wrist: He exhibits no tenderness, no bony tenderness and no swelling.       Left wrist: He exhibits no tenderness, no bony tenderness and no swelling.       Right hip: He exhibits normal range of motion, no tenderness, no bony tenderness and no swelling.       Left hip: He exhibits normal range of motion, no tenderness and no bony tenderness.       Right ankle:  He exhibits no swelling. No tenderness.       Left ankle: He exhibits no swelling. No tenderness.       Cervical back: Normal. He exhibits no tenderness, no bony tenderness and no swelling.       Thoracic back: Normal. He exhibits no tenderness, no bony tenderness and no swelling.       Lumbar back: Normal. He exhibits no tenderness, no bony tenderness and no swelling.  Neurological: He is alert. He has normal strength. He is disoriented. No cranial nerve deficit (no facial droop, extraocular movements intact, no slurred speech) or sensory deficit. He exhibits normal muscle tone. He displays no seizure activity. Coordination normal. GCS eye subscore is 4. GCS verbal subscore is 4. GCS motor subscore is 6.  Skin: Skin is warm and dry. No rash noted.  Psychiatric: He has a normal mood and affect.  Nursing note and vitals reviewed.    ED Treatments / Results  Labs (all labs ordered are listed, but only abnormal results are displayed) Labs Reviewed  BASIC METABOLIC PANEL - Abnormal; Notable for the following:       Result Value   BUN 31 (*)    Calcium 8.8 (*)    GFR calc non Af Amer 60 (*)    All other components within normal limits  CBC WITH DIFFERENTIAL/PLATELET  CBG MONITORING, ED    EKG  EKG Interpretation  Date/Time:  Tuesday October 22 2016 14:11:41 EST Ventricular Rate:  55 PR Interval:  246 QRS Duration: 74 QT Interval:  426 QTC Calculation: 407 R Axis:   49 Text Interpretation:  Sinus bradycardia with sinus arrhythmia with 1st degree A-V block with occasional Premature ventricular complexes Otherwise normal ECG Confirmed by Rush Landmark MD, CHRISTOPHER [phone]) on 10/23/2016 1:11:43 PM       Radiology Ct Head Wo Contrast  Result Date: 10/22/2016 CLINICAL DATA:  Fall, unwitnessed. History of dementia. Initial encounter. EXAM: CT HEAD WITHOUT CONTRAST TECHNIQUE: Contiguous axial images were obtained from the base of the skull through the vertex without intravenous contrast. COMPARISON:  06/27/2016 FINDINGS: Brain: No evidence of acute infarction, hemorrhage, hydrocephalus, extra-axial collection or mass lesion/mass effect. Generalized moderate atrophy. Moderate area of remote infarct in the anterior right frontal lobe, both anterior and middle cerebral artery territory. Microvascular ischemic gliosis in the cerebral white matter that is mild. Vascular: No hyperdense vessel.  Atherosclerotic calcification. Skull: Negative for fracture. Chronic thinning of the right frontal bone. Sinuses/Orbits: No acute finding IMPRESSION: 1. No acute finding. 2. Generalized atrophy and remote right frontal infarct. Electronically Signed   By: Marnee Spring M.D.   On: 10/22/2016 13:52    Procedures Procedures (including critical care time)  Medications Ordered in ED Medications - No data to display   Initial Impression / Assessment and Plan / ED Course  I have reviewed the triage vital signs and the nursing notes.  Pertinent labs & imaging results that were available during my care of the patient were reviewed by me and considered in my medical decision making (see chart for details).  Clinical Course as of Oct 24 2327  Tue Oct 22, 2016  1402 EKGSinus bradycardia with sinus arrhythmia, rate 55First degree AV blockPremature ventricular  contractionsNo prior EKG for comparison  [JK]    Clinical Course User Index [JK] Linwood Dibbles, MD    Suspect mechanical fall.  No signs of injury.  Mental status is at baseline. ED eval reassuring.  there does not appear to be any evidence  of an acute emergency medical condition and the patient appears stable for discharge with appropriate outpatient follow up.  Final Clinical Impressions(s) / ED Diagnoses   Final diagnoses:  Fall, initial encounter    New Prescriptions Discharge Medication List as of 10/22/2016  3:51 PM       Linwood DibblesJon Ferris Tally, MD 10/23/16 2332

## 2016-11-07 ENCOUNTER — Emergency Department (HOSPITAL_COMMUNITY)
Admission: EM | Admit: 2016-11-07 | Discharge: 2016-11-08 | Disposition: A | Discharge status: Home / Self Care | Payer: Medicare Other | Attending: Emergency Medicine | Admitting: Emergency Medicine

## 2016-11-07 ENCOUNTER — Encounter (HOSPITAL_COMMUNITY): Payer: Self-pay | Admitting: Emergency Medicine

## 2016-11-07 ENCOUNTER — Emergency Department (HOSPITAL_COMMUNITY): Payer: Medicare Other

## 2016-11-07 DIAGNOSIS — I10 Essential (primary) hypertension: Secondary | ICD-10-CM | POA: Insufficient documentation

## 2016-11-07 DIAGNOSIS — J101 Influenza due to other identified influenza virus with other respiratory manifestations: Secondary | ICD-10-CM

## 2016-11-07 DIAGNOSIS — Z79899 Other long term (current) drug therapy: Secondary | ICD-10-CM | POA: Insufficient documentation

## 2016-11-07 DIAGNOSIS — Z8673 Personal history of transient ischemic attack (TIA), and cerebral infarction without residual deficits: Secondary | ICD-10-CM | POA: Insufficient documentation

## 2016-11-07 DIAGNOSIS — Z7982 Long term (current) use of aspirin: Secondary | ICD-10-CM | POA: Diagnosis not present

## 2016-11-07 DIAGNOSIS — J09X2 Influenza due to identified novel influenza A virus with other respiratory manifestations: Secondary | ICD-10-CM | POA: Insufficient documentation

## 2016-11-07 DIAGNOSIS — F0390 Unspecified dementia without behavioral disturbance: Secondary | ICD-10-CM | POA: Diagnosis present

## 2016-11-07 LAB — INFLUENZA PANEL BY PCR (TYPE A & B)
Influenza A By PCR: POSITIVE — AB
Influenza B By PCR: NEGATIVE

## 2016-11-07 LAB — CBC WITH DIFFERENTIAL/PLATELET
BASOS ABS: 0 10*3/uL (ref 0.0–0.1)
BASOS PCT: 0 %
Eosinophils Absolute: 0.1 10*3/uL (ref 0.0–0.7)
Eosinophils Relative: 1 %
HCT: 42.3 % (ref 39.0–52.0)
HEMOGLOBIN: 14.1 g/dL (ref 13.0–17.0)
Lymphocytes Relative: 16 %
Lymphs Abs: 1.3 10*3/uL (ref 0.7–4.0)
MCH: 30 pg (ref 26.0–34.0)
MCHC: 33.3 g/dL (ref 30.0–36.0)
MCV: 90 fL (ref 78.0–100.0)
Monocytes Absolute: 0.9 10*3/uL (ref 0.1–1.0)
Monocytes Relative: 11 %
NEUTROS ABS: 6.1 10*3/uL (ref 1.7–7.7)
NEUTROS PCT: 72 %
Platelets: 173 10*3/uL (ref 150–400)
RBC: 4.7 MIL/uL (ref 4.22–5.81)
RDW: 13.2 % (ref 11.5–15.5)
WBC: 8.4 10*3/uL (ref 4.0–10.5)

## 2016-11-07 LAB — TROPONIN I: Troponin I: <0.03 ng/mL (ref <0.03)

## 2016-11-07 LAB — COMPREHENSIVE METABOLIC PANEL
ALT: 10 U/L — ABNORMAL LOW (ref 17–63)
ANION GAP: 7 (ref 5–15)
AST: 15 U/L (ref 15–41)
Albumin: 3.8 g/dL (ref 3.5–5.0)
Alkaline Phosphatase: 60 U/L (ref 38–126)
BUN: 33 mg/dL — ABNORMAL HIGH (ref 6–20)
CALCIUM: 9 mg/dL (ref 8.9–10.3)
CO2: 28 mmol/L (ref 22–32)
Chloride: 115 mmol/L — ABNORMAL HIGH (ref 101–111)
Creatinine, Ser: 1.18 mg/dL (ref 0.61–1.24)
GFR calc non Af Amer: 57 mL/min — ABNORMAL LOW (ref >60)
Glucose, Bld: 123 mg/dL — ABNORMAL HIGH (ref 65–99)
Potassium: 3.5 mmol/L (ref 3.5–5.1)
SODIUM: 150 mmol/L — AB (ref 135–145)
Total Bilirubin: 0.4 mg/dL (ref 0.3–1.2)
Total Protein: 6.4 g/dL — ABNORMAL LOW (ref 6.5–8.1)

## 2016-11-07 MED ORDER — SODIUM CHLORIDE 0.9 % IV BOLUS (SEPSIS)
1000.0000 mL | Freq: Once | INTRAVENOUS | Status: AC
  Administered 2016-11-07: 1000 mL via INTRAVENOUS

## 2016-11-07 MED ORDER — OSELTAMIVIR PHOSPHATE 75 MG PO CAPS
75.0000 mg | ORAL_CAPSULE | Freq: Once | ORAL | Status: DC
  Filled 2016-11-07: qty 1

## 2016-11-07 MED ORDER — OSELTAMIVIR PHOSPHATE 75 MG PO CAPS: 75.0000 mg | ORAL_CAPSULE | Freq: Two times a day (BID) | ORAL | 0 refills | Status: DC

## 2016-11-07 NOTE — ED Triage Notes (Signed)
[facility] Per caregivers pt has been more lethargic/confused than usual x2 days ago. Pt also has a cough. Denies pain at this time, No facial grimacing or signs of discomfort. Pt hx of dementia.

## 2016-11-07 NOTE — Discharge Instructions (Signed)
Mr. Jacob York has a flu. He was also noted to be slightly dehydrated. Fluids given here along with 1st dose of tamiflu.  Monitor him closely. If there is increased weakness, inability to keep any fluids down or worsening respiratory status, have him return to the ER immediately.  To reduce infection onto other folks, try to isolate Mr. Jacob York, and folks should wear mask, gloves and clean their hands every time they encounter him.

## 2016-11-07 NOTE — ED Notes (Addendum)
[facility] [place] [facility] [place]. Unsuccessful. Pt fighting staff, pulled catheter out. Unable to obtain urine.

## 2016-11-07 NOTE — ED Notes (Signed)
Tamiflu must come from Pharmacy. Will administer upon arrival.

## 2016-11-07 NOTE — ED Notes (Signed)
Patient transported to X-ray 

## 2016-11-07 NOTE — ED Notes (Signed)
Bed: WA09 Expected date:  Expected time:  Means of arrival:  Comments: 81 yo lethargic headache

## 2016-11-07 NOTE — ED Provider Notes (Signed)
[facility] Provider Note   CSN: [id number] Arrival date & time: 11/07/16  1851     History   Chief Complaint Chief Complaint  Patient presents with  . Altered Mental Status    HPI Jacob York is a 81 y.o. male.  HPI LEVEL 5 CAVEAT FOR DEMENTIA.  Pt from [facility]. Per caregivers pt has been more weak than usual x2 days ago. Mental status is at baseline,pt is just weaker than usual. Pt also has a cough. I was informed that there was no fever. Pt denies pain at this time. Pt has hx of dementia, stroke, HTN.  Past Medical History:  Diagnosis Date  . Dementia   . Hypertension   . Stroke (HCC)   . Urinary retention 09/22/2014    Patient Active Problem List   Diagnosis Date Noted  . Agitation 12/02/2015  . UTI (lower urinary tract infection) 03/03/2015  . Pre-syncope 10/15/2014  . H/O urinary retention 09/22/2014  . Bigeminy 09/21/2014  . Syncope 09/21/2014  . Bradycardia 09/21/2014  . Chronic renal insufficiency, stage III (moderate) 09/21/2014  . Ventral hernia 06/20/2014  . Bilateral inguinal hernia (BIH) L>>R 05/24/2014  . Incarcerated ventral hernia - 4cm supraumbilical 05/24/2014  . Diastasis recti 05/24/2014  . Memory loss 05/24/2014  . Dementia with behavioral disturbance   . Hypertension     Past Surgical History:  Procedure Laterality Date  . INGUINAL HERNIA REPAIR N/A 06/20/2014   Procedure: LAPAROSCOPIC BILATERAL INGUINAL HERNIA REPAIR, LEFT FEMORAL AND INCARCERATED SUPRA UMBILICAL HERNIA REPAIR WITH MESH;  Surgeon: Karie Soda, MD;  Location: [facility];  Service: General;  Laterality: N/A;  . INSERTION OF MESH N/A 06/20/2014   Procedure: INSERTION OF MESH;  Surgeon: Karie Soda, MD;  Location: [facility];  Service: General;  Laterality: N/A;       Home Medications    Prior to Admission medications   Medication Sig Start Date End Date Taking? Authorizing Provider  acetaminophen (TYLENOL) 500 MG tablet Take 500 mg by mouth every 4  (four) hours as needed for mild pain, moderate pain, fever or headache.    Yes Historical Provider, MD  alum & mag hydroxide-simeth (MINTOX) 200-200-20 MG/5ML suspension Take 30 mLs by mouth every 6 (six) hours as needed for indigestion or heartburn.   Yes Historical Provider, MD  aspirin 81 MG chewable tablet Chew 81 mg by mouth daily.   Yes Historical Provider, MD  cetirizine (ZYRTEC) 10 MG tablet Take 10 mg by mouth daily as needed (itching).   Yes Historical Provider, MD  cholecalciferol (VITAMIN D) 1000 UNITS tablet Take 1,000 Units by mouth daily.   Yes Historical Provider, MD  divalproex (DEPAKOTE) 500 MG DR tablet Take 500 mg by mouth 2 (two) times daily.   Yes Historical Provider, MD  guaifenesin (ROBITUSSIN) 100 MG/5ML syrup Take 200 mg by mouth every 6 (six) hours as needed for cough.    Yes Historical Provider, MD  loperamide (IMODIUM) 2 MG capsule Take 2 mg by mouth as needed for diarrhea or loose stools (with each loose stool (Do not exceed 8 doses in 24 hours).).    Yes Historical Provider, MD  LORazepam (ATIVAN) 0.5 MG tablet Take 0.25 mg by mouth 3 (three) times daily.    Yes Historical Provider, MD  LORazepam (ATIVAN) 0.5 MG tablet Take 0.5 mg by mouth every 8 (eight) hours as needed for anxiety.   Yes Historical Provider, MD  magnesium hydroxide (MILK OF MAGNESIA) 400 MG/5ML suspension Take 30 mLs by  mouth at bedtime as needed for mild constipation or moderate constipation. Reported on 01/02/2016   Yes Historical Provider, MD  mirtazapine (REMERON) 30 MG tablet Take 30 mg by mouth at bedtime.   Yes Historical Provider, MD  NAMENDA XR 14 MG CP24 24 hr capsule Take 14 mg by mouth daily. 01/30/16  Yes Historical Provider, MD  neomycin-bacitracin-polymyxin (NEOSPORIN) [phone] ointment Apply 1 application topically daily as needed (for skin tears, abrasions, or minor irritation).    Yes Historical Provider, MD  QUEtiapine (SEROQUEL) 25 MG tablet Take 25 mg by mouth 2 (two) times daily.    Yes Historical Provider, MD  Skin Protectants, Misc. (MINERIN) CREA Apply 1 application topically daily as needed (dry skin).   Yes Historical Provider, MD  tamsulosin (FLOMAX) 0.4 MG CAPS capsule Take 1 capsule (0.4 mg total) by mouth 2 (two) times daily. 03/21/16  Yes Nelva Nayobert Beaton, MD  Nutritional Supplements (GLUCERNA SHAKE PO) Take 1 each by mouth 3 (three) times daily. Mighty Shakes    Historical Provider, MD    Family History Family History  Problem Relation Age of Onset  . Stroke Mother   . Hypertension Mother     Social History Social History  Substance Use Topics  . Smoking status: Never Smoker  . Smokeless tobacco: Never Used  . Alcohol use No     Allergies   Patient has no known allergies.   Review of Systems Review of Systems  Unable to perform ROS: Dementia     Physical Exam Updated Vital Signs BP 112/75 (BP Location: Right Arm)   Pulse 77   Temp 98 F (36.7 C) (Oral)   Resp 18   SpO2 95%   Physical Exam  Constitutional: He appears well-developed. No distress.  HENT:  Head: Normocephalic and atraumatic.  Eyes: EOM are normal. Pupils are equal, round, and reactive to light.  Neck: Normal range of motion. Neck supple.  Cardiovascular: Normal rate and regular rhythm.   Pulmonary/Chest: Effort normal and breath sounds normal. No respiratory distress. He has no wheezes. He has no rales.  Abdominal: Soft. Bowel sounds are normal. He exhibits no distension. There is no tenderness. There is no rebound and no guarding.  Neurological:  Oriented to self. Pt follows simple commands.  Skin: Skin is warm.  Nursing note and vitals reviewed.    ED Treatments / Results  Labs (all labs ordered are listed, but only abnormal results are displayed) Labs Reviewed  COMPREHENSIVE METABOLIC PANEL - Abnormal; Notable for the following:       Result Value   Sodium 150 (*)    Chloride 115 (*)    Glucose, Bld 123 (*)    BUN 33 (*)    Total Protein 6.4 (*)    ALT 10  (*)    GFR calc non Af Amer 57 (*)    All other components within normal limits  INFLUENZA PANEL BY PCR (TYPE A & B) - Abnormal; Notable for the following:    Influenza A By PCR POSITIVE (*)    All other components within normal limits  URINE CULTURE  CBC WITH DIFFERENTIAL/PLATELET  TROPONIN I  URINALYSIS, ROUTINE W REFLEX MICROSCOPIC    EKG  EKG Interpretation  Date/Time:  Thursday November 07 2016 19:04:27 EST Ventricular Rate:  69 PR Interval:    QRS Duration: 86 QT Interval:  393 QTC Calculation: 421 R Axis:   35 Text Interpretation:  Sinus rhythm Prolonged PR interval Low voltage, extremity and precordial leads Nonspecific T  abnormalities, lateral leads No acute changes Confirmed by Rhunette Croft, MD, Janey Genta [phone]) on 11/07/2016 8:02:51 PM       Radiology Dg Chest 2 View  Result Date: 11/07/2016 CLINICAL DATA:  Increased lethargy and confusion for the past 2 days. Cough. EXAM: CHEST  2 VIEW COMPARISON:  Chest x-rays dated 06/27/2016 in 03/2014 2017. FINDINGS: Heart size and mediastinal contours are stable. Atherosclerotic changes again noted at the aortic arch. Probable scarring and/or atelectasis at the left lung base. Lungs otherwise clear. No evidence of pneumonia. No pleural effusion or pneumothorax seen. No acute or suspicious osseous finding. IMPRESSION: No active cardiopulmonary disease. No evidence of pneumonia or pulmonary edema. Aortic atherosclerosis. Electronically Signed   By: Bary Richard M.D.   On: 11/07/2016 21:10    Procedures Procedures (including critical care time)  Medications Ordered in ED Medications  sodium chloride 0.9 % bolus 1,000 mL (1,000 mLs Intravenous New Bag/Given 11/07/16 2314)  oseltamivir (TAMIFLU) capsule 75 mg (not administered)     Initial Impression / Assessment and Plan / ED Course  I have reviewed the triage vital signs and the nursing notes.  Pertinent labs & imaging results that were available during my care of the patient were  reviewed by me and considered in my medical decision making (see chart for details).  Clinical Course as of Nov 08 10  Thu Nov 07, 2016  2358 Spoke with Ms. Foye Clock over at [facility]. Results from the ER workup discussed with her. Advised close monitoring and strict return precautions over the phone. Pt is stable for d/c.  [AN]    Clinical Course User Index [AN] [name], MD    PT comes in with cc of weakness from SNF. He is noted to be slightly hypernatremic, and dehydrated. As such, we will give him fluids in the ER. Pt's exam is non focal.  DDx: Sepsis syndrome ACS syndrome ICH / Stroke Infection - pneumonia/UTI/Cellulitis / influenza Dehydration Electrolyte abnormality Tox syndrome  Pt is noted to be moving all 4 extremities, and there is no signs of overt infection. Trops, CXR, EKG ordered. PT did have a wet sounding cough on my exam. CXR is clear, we will get flu swab ordered as well. VSS and WNL. We will monitor closely in the ER and hydrate for now.   Final Clinical Impressions(s) / ED Diagnoses   Final diagnoses:  Influenza A    New Prescriptions New Prescriptions   No medications on file     [name], MD 11/11/16 2155

## 2016-11-08 NOTE — ED Notes (Signed)
[facility] called for pt transport. 

## 2016-11-26 ENCOUNTER — Emergency Department (HOSPITAL_COMMUNITY)
Admission: EM | Admit: 2016-11-26 | Discharge: 2016-11-26 | Disposition: A | Discharge status: Home / Self Care | Payer: Medicare Other | Attending: Emergency Medicine | Admitting: Emergency Medicine

## 2016-11-26 ENCOUNTER — Emergency Department (HOSPITAL_COMMUNITY): Payer: Medicare Other

## 2016-11-26 DIAGNOSIS — I129 Hypertensive chronic kidney disease with stage 1 through stage 4 chronic kidney disease, or unspecified chronic kidney disease: Secondary | ICD-10-CM | POA: Diagnosis not present

## 2016-11-26 DIAGNOSIS — Z7982 Long term (current) use of aspirin: Secondary | ICD-10-CM | POA: Insufficient documentation

## 2016-11-26 DIAGNOSIS — N183 Chronic kidney disease, stage 3 (moderate): Secondary | ICD-10-CM | POA: Insufficient documentation

## 2016-11-26 DIAGNOSIS — E860 Dehydration: Secondary | ICD-10-CM | POA: Diagnosis not present

## 2016-11-26 DIAGNOSIS — R410 Disorientation, unspecified: Secondary | ICD-10-CM | POA: Insufficient documentation

## 2016-11-26 DIAGNOSIS — Z8673 Personal history of transient ischemic attack (TIA), and cerebral infarction without residual deficits: Secondary | ICD-10-CM | POA: Insufficient documentation

## 2016-11-26 DIAGNOSIS — R531 Weakness: Secondary | ICD-10-CM | POA: Diagnosis present

## 2016-11-26 DIAGNOSIS — R400 Somnolence: Secondary | ICD-10-CM

## 2016-11-26 LAB — BASIC METABOLIC PANEL
ANION GAP: 10 (ref 5–15)
BUN: 30 mg/dL — ABNORMAL HIGH (ref 6–20)
CHLORIDE: 112 mmol/L — AB (ref 101–111)
CO2: 24 mmol/L (ref 22–32)
Calcium: 8.9 mg/dL (ref 8.9–10.3)
Creatinine, Ser: 1.6 mg/dL — ABNORMAL HIGH (ref 0.61–1.24)
GFR calc Af Amer: 46 mL/min — ABNORMAL LOW (ref >60)
GFR, EST NON AFRICAN AMERICAN: 40 mL/min — AB (ref >60)
GLUCOSE: 94 mg/dL (ref 65–99)
POTASSIUM: 3 mmol/L — AB (ref 3.5–5.1)
SODIUM: 146 mmol/L — AB (ref 135–145)

## 2016-11-26 LAB — CBC
HCT: 42.9 % (ref 39.0–52.0)
HEMOGLOBIN: 14.4 g/dL (ref 13.0–17.0)
MCH: 30 pg (ref 26.0–34.0)
MCHC: 33.6 g/dL (ref 30.0–36.0)
MCV: 89.4 fL (ref 78.0–100.0)
Platelets: 146 10*3/uL — ABNORMAL LOW (ref 150–400)
RBC: 4.8 MIL/uL (ref 4.22–5.81)
RDW: 14.1 % (ref 11.5–15.5)
WBC: 6.1 10*3/uL (ref 4.0–10.5)

## 2016-11-26 LAB — CBG MONITORING, ED: Glucose-Capillary: 100 mg/dL — ABNORMAL HIGH (ref 65–99)

## 2016-11-26 MED ORDER — SODIUM CHLORIDE 0.9 % IV BOLUS (SEPSIS)
1000.0000 mL | Freq: Once | INTRAVENOUS | Status: AC
  Administered 2016-11-26: 1000 mL via INTRAVENOUS

## 2016-11-26 MED ORDER — HALOPERIDOL LACTATE 5 MG/ML IJ SOLN
2.0000 mg | Freq: Once | INTRAMUSCULAR | Status: AC
  Administered 2016-11-26: 2 mg via INTRAMUSCULAR
  Filled 2016-11-26: qty 1

## 2016-11-26 NOTE — ED Notes (Signed)
Patient was alert, and stable upon discharge. [facility]

## 2016-11-26 NOTE — ED Triage Notes (Signed)
[facility] he was at baseline this morning and is more sleepy today and did not take his meds at 2pm. Hx of dementia. Dx with flu 2 weeks ago. Alert to voice.

## 2016-11-26 NOTE — ED Provider Notes (Signed)
[facility]-EMERGENCY DEPT Provider Note   CSN: [id number] Arrival date & time: 11/26/16  1646     History   Chief Complaint Chief Complaint  Patient presents with  . Weakness    HPI Barnett HatterBruce E Kemppainen is a 81 y.o. male.  The history is provided by the nursing home and the EMS personnel.  Weakness  Primary symptoms comment: sleepiness today. This is a new problem. The current episode started 12 to 24 hours ago. The problem has been gradually improving. There was no focality noted. There has been no fever. Associated symptoms include confusion (at baseline dementia). Associated medical issues include dementia.    Past Medical History:  Diagnosis Date  . Dementia   . Hypertension   . Stroke (HCC)   . Urinary retention 09/22/2014    Patient Active Problem List   Diagnosis Date Noted  . Agitation 12/02/2015  . UTI (lower urinary tract infection) 03/03/2015  . Pre-syncope 10/15/2014  . H/O urinary retention 09/22/2014  . Bigeminy 09/21/2014  . Syncope 09/21/2014  . Bradycardia 09/21/2014  . Chronic renal insufficiency, stage III (moderate) 09/21/2014  . Ventral hernia 06/20/2014  . Bilateral inguinal hernia (BIH) L>>R 05/24/2014  . Incarcerated ventral hernia - 4cm supraumbilical 05/24/2014  . Diastasis recti 05/24/2014  . Memory loss 05/24/2014  . Dementia with behavioral disturbance   . Hypertension     Past Surgical History:  Procedure Laterality Date  . INGUINAL HERNIA REPAIR N/A 06/20/2014   Procedure: LAPAROSCOPIC BILATERAL INGUINAL HERNIA REPAIR, LEFT FEMORAL AND INCARCERATED SUPRA UMBILICAL HERNIA REPAIR WITH MESH;  Surgeon: Karie SodaSteven Gross, MD;  Location: [facility];  Service: General;  Laterality: N/A;  . INSERTION OF MESH N/A 06/20/2014   Procedure: INSERTION OF MESH;  Surgeon: Karie SodaSteven Gross, MD;  Location: [facility];  Service: General;  Laterality: N/A;       Home Medications    Prior to Admission medications   Medication Sig Start Date End Date Taking? Authorizing  Provider  acetaminophen (TYLENOL) 500 MG tablet Take 500 mg by mouth every 4 (four) hours as needed for mild pain, moderate pain, fever or headache.    Yes Historical Provider, MD  alum & mag hydroxide-simeth (MINTOX) 200-200-20 MG/5ML suspension Take 30 mLs by mouth every 6 (six) hours as needed for indigestion or heartburn.   Yes Historical Provider, MD  aspirin 81 MG chewable tablet Chew 81 mg by mouth daily.   Yes Historical Provider, MD  cetirizine (ZYRTEC) 10 MG tablet Take 10 mg by mouth daily as needed (itching).   Yes Historical Provider, MD  cholecalciferol (VITAMIN D) 1000 UNITS tablet Take 1,000 Units by mouth daily.   Yes Historical Provider, MD  divalproex (DEPAKOTE) 500 MG DR tablet Take 500 mg by mouth 2 (two) times daily.   Yes Historical Provider, MD  guaifenesin (ROBITUSSIN) 100 MG/5ML syrup Take 200 mg by mouth every 6 (six) hours as needed for cough.    Yes Historical Provider, MD  loperamide (IMODIUM) 2 MG capsule Take 2 mg by mouth as needed for diarrhea or loose stools (with each loose stool (Do not exceed 8 doses in 24 hours).).    Yes Historical Provider, MD  LORazepam (ATIVAN) 0.5 MG tablet Take 0.25 mg by mouth every 8 (eight) hours as needed for anxiety (agitation).    Yes Historical Provider, MD  LORazepam (ATIVAN) 0.5 MG tablet Take 0.5 mg by mouth 3 (three) times daily.    Yes Historical Provider, MD  magnesium hydroxide (MILK OF MAGNESIA) 400 MG/5ML  suspension Take 30 mLs by mouth at bedtime as needed for mild constipation or moderate constipation. Reported on 01/02/2016   Yes Historical Provider, MD  mirtazapine (REMERON) 30 MG tablet Take 30 mg by mouth at bedtime.   Yes Historical Provider, MD  NAMENDA XR 14 MG CP24 24 hr capsule Take 14 mg by mouth daily. 01/30/16  Yes Historical Provider, MD  neomycin-bacitracin-polymyxin (NEOSPORIN) [phone] ointment Apply 1 application topically daily as needed (for skin tears, abrasions, or minor irritation).    Yes Historical  Provider, MD  QUEtiapine (SEROQUEL) 25 MG tablet Take 25 mg by mouth 2 (two) times daily.   Yes Historical Provider, MD  Skin Protectants, Misc. (MINERIN) CREA Apply 1 application topically daily as needed (dry skin).   Yes Historical Provider, MD  tamsulosin (FLOMAX) 0.4 MG CAPS capsule Take 1 capsule (0.4 mg total) by mouth 2 (two) times daily. 03/21/16  Yes [name], MD  Nutritional Supplements (GLUCERNA SHAKE PO) Take 1 each by mouth 3 (three) times daily. Mighty Shakes    Historical Provider, MD  oseltamivir (TAMIFLU) 75 MG capsule Take 1 capsule (75 mg total) by mouth every 12 (twelve) hours. Patient not taking: Reported on 11/26/2016 11/07/16   [name], MD    Family History Family History  Problem Relation Age of Onset  . Stroke Mother   . Hypertension Mother     Social History Social History  Substance Use Topics  . Smoking status: Never Smoker  . Smokeless tobacco: Never Used  . Alcohol use No     Allergies   Patient has no known allergies.   Review of Systems Review of Systems  Neurological: Positive for weakness.  Psychiatric/Behavioral: Positive for confusion (at baseline dementia).  All other systems reviewed and are negative.    Physical Exam Updated Vital Signs BP 134/75   Pulse 76   Temp 98.4 F (36.9 C) (Oral)   Resp 17   SpO2 93%   Physical Exam  Constitutional: He appears well-developed and well-nourished. No distress.  HENT:  Head: Normocephalic and atraumatic.  Nose: Nose normal.  Eyes: Conjunctivae are normal.  Neck: Neck supple. No tracheal deviation present.  Cardiovascular: Normal rate and regular rhythm.   Pulmonary/Chest: Effort normal. No respiratory distress.  Abdominal: Soft. He exhibits no distension.  Neurological: He is alert. He is disoriented.  Skin: Skin is warm and dry.  Psychiatric: He has a normal mood and affect.     ED Treatments / Results  Labs (all labs ordered are listed, but only abnormal results are  displayed) Labs Reviewed  BASIC METABOLIC PANEL - Abnormal; Notable for the following:       Result Value   Sodium 146 (*)    Potassium 3.0 (*)    Chloride 112 (*)    BUN 30 (*)    Creatinine, Ser 1.60 (*)    GFR calc non Af Amer 40 (*)    GFR calc Af Amer 46 (*)    All other components within normal limits  CBC - Abnormal; Notable for the following:    Platelets 146 (*)    All other components within normal limits  CBG MONITORING, ED - Abnormal; Notable for the following:    Glucose-Capillary 100 (*)    All other components within normal limits    EKG  EKG Interpretation  Date/Time:  Tuesday November 26 2016 17:08:57 EST Ventricular Rate:  77 PR Interval:    QRS Duration: 83 QT Interval:  389 QTC Calculation: 441  R Axis:   -5 Text Interpretation:  Sinus rhythm Low voltage, extremity and precordial leads Baseline wander in lead(s) I No significant change since last tracing Confirmed by Aadvik Roker MD, Wojciech Willetts [phone]) on 11/26/2016 5:47:09 PM       Radiology Dg Chest 2 View  Result Date: 11/26/2016 CLINICAL DATA:  PT AMS.Pt presents from [facility] and very lethargic. Pt has SOB. Dx with flu 2 weeks ago. PT Hx: dementia, HTN EXAM: CHEST  2 VIEW COMPARISON:  11/07/2016 FINDINGS: The heart, mediastinum and hila are unremarkable. Lungs are mildly hyperexpanded but clear. No pleural effusion. No pneumothorax. Skeletal structures are intact. IMPRESSION: No active cardiopulmonary disease. Electronically Signed   By: Amie Portland M.D.   On: 11/26/2016 18:17    Procedures Procedures (including critical care time)  Medications Ordered in ED Medications  sodium chloride 0.9 % bolus 1,000 mL (1,000 mLs Intravenous New Bag/Given 11/26/16 1836)     Initial Impression / Assessment and Plan / ED Course  I have reviewed the triage vital signs and the nursing notes.  Pertinent labs & imaging results that were available during my care of the patient were reviewed by me and considered  in my medical decision making (see chart for details).     81 year old male presents with stated excessive daytime sleepiness from nursing facility. He is awake and alert on arrival and oriented to his baseline dementia. His labs are consistent with mild dehydration with slight increase of creatinine. He was given IV fluids to help compensate for this. No white blood cell count elevation, patient no longer sleepy and now combative with staff at his baseline. Unable to get a urine sample as patient urinated on the bed and had a dry in and out cath at this time suspicion is low for ongoing infection with lack of physiologic dysfunction. He was given a dose of IM Haldol because he became slightly agitated while interacting with staff prior to discharge which further demonstrates his sleepiness has resolved. Plan to follow up with PCP as needed and return precautions discussed for worsening or new concerning symptoms.   Final Clinical Impressions(s) / ED Diagnoses   Final diagnoses:  Dehydration  Daytime sleepiness    New Prescriptions New Prescriptions   No medications on file     Lyndal Pulley, MD 11/27/16 1354

## 2016-11-26 NOTE — ED Notes (Signed)
Pt refused to get blood pressure taken. He ripped the cuff off of his arm.

## 2016-11-26 NOTE — ED Notes (Signed)
Bed: WA03 Expected date:  Expected time:  Means of arrival:  Comments: 30M/increased weakness

## 2017-01-03 ENCOUNTER — Emergency Department (HOSPITAL_COMMUNITY): Payer: Medicare Other

## 2017-01-03 ENCOUNTER — Encounter (HOSPITAL_COMMUNITY): Payer: Self-pay | Admitting: Emergency Medicine

## 2017-01-03 ENCOUNTER — Emergency Department (HOSPITAL_COMMUNITY)
Admission: EM | Admit: 2017-01-03 | Discharge: 2017-01-03 | Disposition: A | Discharge status: Home / Self Care | Payer: Medicare Other | Attending: Emergency Medicine | Admitting: Emergency Medicine

## 2017-01-03 DIAGNOSIS — I129 Hypertensive chronic kidney disease with stage 1 through stage 4 chronic kidney disease, or unspecified chronic kidney disease: Secondary | ICD-10-CM | POA: Insufficient documentation

## 2017-01-03 DIAGNOSIS — Z8673 Personal history of transient ischemic attack (TIA), and cerebral infarction without residual deficits: Secondary | ICD-10-CM | POA: Insufficient documentation

## 2017-01-03 DIAGNOSIS — Z7982 Long term (current) use of aspirin: Secondary | ICD-10-CM | POA: Diagnosis not present

## 2017-01-03 DIAGNOSIS — Y929 Unspecified place or not applicable: Secondary | ICD-10-CM | POA: Diagnosis not present

## 2017-01-03 DIAGNOSIS — Y999 Unspecified external cause status: Secondary | ICD-10-CM | POA: Diagnosis not present

## 2017-01-03 DIAGNOSIS — Y939 Activity, unspecified: Secondary | ICD-10-CM | POA: Insufficient documentation

## 2017-01-03 DIAGNOSIS — W228XXA Striking against or struck by other objects, initial encounter: Secondary | ICD-10-CM | POA: Insufficient documentation

## 2017-01-03 DIAGNOSIS — S7001XA Contusion of right hip, initial encounter: Secondary | ICD-10-CM | POA: Diagnosis not present

## 2017-01-03 DIAGNOSIS — S79911A Unspecified injury of right hip, initial encounter: Secondary | ICD-10-CM | POA: Diagnosis present

## 2017-01-03 DIAGNOSIS — N183 Chronic kidney disease, stage 3 (moderate): Secondary | ICD-10-CM | POA: Diagnosis not present

## 2017-01-03 LAB — URINALYSIS, ROUTINE W REFLEX MICROSCOPIC
Bilirubin Urine: NEGATIVE
Glucose, UA: NEGATIVE mg/dL
Hgb urine dipstick: NEGATIVE
Ketones, ur: NEGATIVE mg/dL
Leukocytes, UA: NEGATIVE
Nitrite: NEGATIVE
Protein, ur: NEGATIVE mg/dL
Specific Gravity, Urine: 1.009 (ref 1.005–1.030)
pH: 5 (ref 5.0–8.0)

## 2017-01-03 NOTE — Discharge Instructions (Signed)
Jacob York has a bruise over the greater trochanter of his R hip. I do not see an abscess here or elsewhere. X-rays of his hip are unremarkable. We checked a urinalysis which was unremarkable as well.

## 2017-01-03 NOTE — ED Notes (Signed)
Bed: [facility]ALA Expected date:  Expected time:  Means of arrival:  Comments: NO BED 

## 2017-01-03 NOTE — ED Provider Notes (Signed)
[facility]-EMERGENCY DEPT Provider Note   CSN: [id number] Arrival date & time: 01/03/17  1254     History   Chief Complaint Chief Complaint  Patient presents with  . Abscess  . Hip Pain    HPI Jacob York is a 81 y.o. male.  HPI   81 year old male sent for evaluation of apparent right hip pain. Patient reports that he struck this area gets a desk. He is not a good historian otherwise. Apparently he is only oriented to himself at baseline. No reported trauma from facility.  Past Medical History:  Diagnosis Date  . Dementia   . Hypertension   . Stroke (HCC)   . Urinary retention 09/22/2014    Patient Active Problem List   Diagnosis Date Noted  . Agitation 12/02/2015  . UTI (lower urinary tract infection) 03/03/2015  . Pre-syncope 10/15/2014  . H/O urinary retention 09/22/2014  . Bigeminy 09/21/2014  . Syncope 09/21/2014  . Bradycardia 09/21/2014  . Chronic renal insufficiency, stage III (moderate) 09/21/2014  . Ventral hernia 06/20/2014  . Bilateral inguinal hernia (BIH) L>>R 05/24/2014  . Incarcerated ventral hernia - 4cm supraumbilical 05/24/2014  . Diastasis recti 05/24/2014  . Memory loss 05/24/2014  . Dementia with behavioral disturbance   . Hypertension     Past Surgical History:  Procedure Laterality Date  . INGUINAL HERNIA REPAIR N/A 06/20/2014   Procedure: LAPAROSCOPIC BILATERAL INGUINAL HERNIA REPAIR, LEFT FEMORAL AND INCARCERATED SUPRA UMBILICAL HERNIA REPAIR WITH MESH;  Surgeon: Jacob Soda, MD;  Location: [facility];  Service: General;  Laterality: N/A;  . INSERTION OF MESH N/A 06/20/2014   Procedure: INSERTION OF MESH;  Surgeon: Jacob Soda, MD;  Location: [facility];  Service: General;  Laterality: N/A;       Home Medications    Prior to Admission medications   Medication Sig Start Date End Date Taking? Authorizing Provider  acetaminophen (TYLENOL) 500 MG tablet Take 500 mg by mouth every 4 (four) hours as needed for mild pain, moderate pain, fever  or headache.    Yes Historical Provider, MD  alum & mag hydroxide-simeth (MINTOX) 200-200-20 MG/5ML suspension Take 30 mLs by mouth every 6 (six) hours as needed for indigestion or heartburn.   Yes Historical Provider, MD  aspirin 81 MG chewable tablet Chew 81 mg by mouth daily.   Yes Historical Provider, MD  cetirizine (ZYRTEC) 10 MG tablet Take 10 mg by mouth daily as needed (itching).   Yes Historical Provider, MD  cholecalciferol (VITAMIN D) 1000 UNITS tablet Take 1,000 Units by mouth daily.   Yes Historical Provider, MD  divalproex (DEPAKOTE) 250 MG DR tablet Take 250-500 mg by mouth 2 (two) times daily. 250 mg each morning & 500 mg each night   Yes Historical Provider, MD  guaifenesin (ROBITUSSIN) 100 MG/5ML syrup Take 200 mg by mouth every 6 (six) hours as needed for cough.    Yes Historical Provider, MD  loperamide (IMODIUM) 2 MG capsule Take 2 mg by mouth as needed for diarrhea or loose stools (with each loose stool (Do not exceed 8 doses in 24 hours).).    Yes Historical Provider, MD  LORazepam (ATIVAN) 0.5 MG tablet Take 0.25 mg by mouth every 8 (eight) hours as needed for anxiety (agitation).    Yes Historical Provider, MD  LORazepam (ATIVAN) 0.5 MG tablet Take 0.5 mg by mouth 2 (two) times daily.    Yes Historical Provider, MD  magnesium hydroxide (MILK OF MAGNESIA) 400 MG/5ML suspension Take 30 mLs by mouth at  bedtime as needed for mild constipation or moderate constipation. Reported on 01/02/2016   Yes Historical Provider, MD  mirtazapine (REMERON) 30 MG tablet Take 30 mg by mouth at bedtime.   Yes Historical Provider, MD  NAMENDA XR 14 MG CP24 24 hr capsule Take 14 mg by mouth daily. 01/30/16  Yes Historical Provider, MD  neomycin-bacitracin-polymyxin (NEOSPORIN) [phone] ointment Apply 1 application topically daily as needed (for skin tears, abrasions, or minor irritation).    Yes Historical Provider, MD  QUEtiapine (SEROQUEL) 25 MG tablet Take 25 mg by mouth 2 (two) times daily.   Yes  Historical Provider, MD  Skin Protectants, Misc. (MINERIN) CREA Apply 1 application topically daily as needed (dry skin).   Yes Historical Provider, MD  tamsulosin (FLOMAX) 0.4 MG CAPS capsule Take 1 capsule (0.4 mg total) by mouth 2 (two) times daily. 03/21/16  Yes [name], MD  oseltamivir (TAMIFLU) 75 MG capsule Take 1 capsule (75 mg total) by mouth every 12 (twelve) hours. Patient not taking: Reported on 11/26/2016 11/07/16   Jacob Kaplan, MD    Family History Family History  Problem Relation Age of Onset  . Stroke Mother   . Hypertension Mother     Social History Social History  Substance Use Topics  . Smoking status: Never Smoker  . Smokeless tobacco: Never Used  . Alcohol use No     Allergies   Patient has no known allergies.   Review of Systems Review of Systems  Level 5 caveat because of dementia.  Physical Exam Updated Vital Signs BP 139/69 (BP Location: Right Arm)   Pulse 65   Temp 98.6 F (37 C) (Oral)   Resp 18   SpO2 100%   Physical Exam  Constitutional: He appears well-developed and well-nourished. No distress.  HENT:  Head: Normocephalic and atraumatic.  Eyes: Conjunctivae are normal. Right eye exhibits no discharge. Left eye exhibits no discharge.  Neck: Neck supple.  Cardiovascular: Normal rate, regular rhythm and normal heart sounds.  Exam reveals no gallop and no friction rub.   No murmur heard. Pulmonary/Chest: Effort normal and breath sounds normal. No respiratory distress.  Abdominal: Soft. He exhibits no distension. There is no tenderness.  Musculoskeletal: He exhibits no edema or tenderness.       Right hip: He exhibits bony tenderness.       Legs: Neurological: He is alert.  Expressive aphasia. Moves all extremities. No focal motor deficits.  Skin: Skin is warm and dry.  Psychiatric: He has a normal mood and affect. His behavior is normal. Thought content normal.  Nursing note and vitals reviewed.    ED Treatments / Results    Labs (all labs ordered are listed, but only abnormal results are displayed) Labs Reviewed  URINALYSIS, ROUTINE W REFLEX MICROSCOPIC - Abnormal; Notable for the following:       Result Value   Color, Urine STRAW (*)    All other components within normal limits    EKG  EKG Interpretation None       Radiology Dg Hip Unilat W Or Wo Pelvis 2-3 Views Right  Result Date: 01/03/2017 CLINICAL DATA:  Right hip pain, no known injury EXAM: DG HIP (WITH OR WITHOUT PELVIS) 2-3V RIGHT COMPARISON:  None. FINDINGS: Four views of the right hip submitted. No acute fracture or subluxation. No periosteal reaction or bony erosion. Bilateral hip joints are symmetrical in appearance. Mild bilateral superior acetabular spurring. Mild degenerative changes pubic symphysis IMPRESSION: No acute fracture or subluxation.  Mild degenerative  changes. Electronically Signed   By: Natasha Mead M.D.   On: 01/03/2017 14:54    Procedures Procedures (including critical care time)  Medications Ordered in ED Medications - No data to display   Initial Impression / Assessment and Plan / ED Course  I have reviewed the triage vital signs and the nursing notes.  Pertinent labs & imaging results that were available during my care of the patient were reviewed by me and considered in my medical decision making (see chart for details).     81 year old male with right hip pain. Patient is confused and not a good historian. Apparently this is his baseline though. There is a mention of abscess on his hip in the triage note. He was fully exposed. I do not appreciate an abscess anywhere. He does have some ecchymosis just over R greater trochanter.  He points to this area as well as just distal to this along his thigh and says it hurts. He tells me he fell into a desk?  He seems uncomfortable. Noted history of inguinal and supraumbilical hernias.  Final Clinical Impressions(s) / ED Diagnoses   Final diagnoses:  Contusion of right  hip, initial encounter    New Prescriptions New Prescriptions   No medications on file     Raeford Razor, MD 01/06/17 1158

## 2017-01-03 NOTE — ED Triage Notes (Signed)
Patient is from [facility].  Facility states patient has an abscess on right hip/thigh. Patient hasn't been walking for over a month. He is alerted to baseline, A & O x1. Denies any recent falls, injuries, trauma, or sx in past month.  Denies fever, chilis, n/v.  BP: 136/72 HR: 62 R:18 O2: 99% room air  Temp: 98.1  CBG: 92

## 2017-01-03 NOTE — ED Notes (Signed)
Bed: WA08 Expected date: 01/03/17 Expected time: 12:53 PM Means of arrival: Ambulance Comments: Abscess on hip

## 2017-04-15 ENCOUNTER — Emergency Department (HOSPITAL_COMMUNITY): Payer: Medicare Other

## 2017-04-15 ENCOUNTER — Emergency Department (HOSPITAL_COMMUNITY)
Admission: EM | Admit: 2017-04-15 | Discharge: 2017-04-15 | Disposition: A | Discharge status: Home / Self Care | Payer: Medicare Other | Attending: Emergency Medicine | Admitting: Emergency Medicine

## 2017-04-15 ENCOUNTER — Encounter (HOSPITAL_COMMUNITY): Payer: Self-pay

## 2017-04-15 DIAGNOSIS — Z79899 Other long term (current) drug therapy: Secondary | ICD-10-CM | POA: Diagnosis not present

## 2017-04-15 DIAGNOSIS — F0391 Unspecified dementia with behavioral disturbance: Secondary | ICD-10-CM | POA: Insufficient documentation

## 2017-04-15 DIAGNOSIS — W19XXXA Unspecified fall, initial encounter: Secondary | ICD-10-CM | POA: Diagnosis not present

## 2017-04-15 DIAGNOSIS — Z043 Encounter for examination and observation following other accident: Secondary | ICD-10-CM | POA: Insufficient documentation

## 2017-04-15 DIAGNOSIS — R451 Restlessness and agitation: Secondary | ICD-10-CM | POA: Diagnosis present

## 2017-04-15 DIAGNOSIS — Z7982 Long term (current) use of aspirin: Secondary | ICD-10-CM | POA: Insufficient documentation

## 2017-04-15 DIAGNOSIS — I10 Essential (primary) hypertension: Secondary | ICD-10-CM | POA: Diagnosis not present

## 2017-04-15 LAB — URINALYSIS, ROUTINE W REFLEX MICROSCOPIC
Bacteria, UA: NONE SEEN
Bilirubin Urine: NEGATIVE
GLUCOSE, UA: NEGATIVE mg/dL
Hgb urine dipstick: NEGATIVE
Ketones, ur: NEGATIVE mg/dL
Nitrite: NEGATIVE
PROTEIN: NEGATIVE mg/dL
SQUAMOUS EPITHELIAL / LPF: NONE SEEN
Specific Gravity, Urine: 1.008 (ref 1.005–1.030)
pH: 9 — ABNORMAL HIGH (ref 5.0–8.0)

## 2017-04-15 LAB — VALPROIC ACID LEVEL: VALPROIC ACID LVL: 45 ug/mL — AB (ref 50.0–100.0)

## 2017-04-15 LAB — CBC WITH DIFFERENTIAL/PLATELET
Basophils Absolute: 0 10*3/uL (ref 0.0–0.1)
Basophils Relative: 1 %
EOS ABS: 0.1 10*3/uL (ref 0.0–0.7)
EOS PCT: 2 %
HCT: 42.9 % (ref 39.0–52.0)
HEMOGLOBIN: 14.9 g/dL (ref 13.0–17.0)
LYMPHS ABS: 1.7 10*3/uL (ref 0.7–4.0)
Lymphocytes Relative: 24 %
MCH: 30.6 pg (ref 26.0–34.0)
MCHC: 34.7 g/dL (ref 30.0–36.0)
MCV: 88.1 fL (ref 78.0–100.0)
MONOS PCT: 6 %
Monocytes Absolute: 0.4 10*3/uL (ref 0.1–1.0)
Neutro Abs: 4.8 10*3/uL (ref 1.7–7.7)
Neutrophils Relative %: 67 %
PLATELETS: 176 10*3/uL (ref 150–400)
RBC: 4.87 MIL/uL (ref 4.22–5.81)
RDW: 13 % (ref 11.5–15.5)
WBC: 7.1 10*3/uL (ref 4.0–10.5)

## 2017-04-15 LAB — COMPREHENSIVE METABOLIC PANEL
ALT: 9 U/L — AB (ref 17–63)
AST: 18 U/L (ref 15–41)
Albumin: 4 g/dL (ref 3.5–5.0)
Alkaline Phosphatase: 56 U/L (ref 38–126)
Anion gap: 9 (ref 5–15)
BUN: 17 mg/dL (ref 6–20)
CHLORIDE: 107 mmol/L (ref 101–111)
CO2: 25 mmol/L (ref 22–32)
CREATININE: 1.26 mg/dL — AB (ref 0.61–1.24)
Calcium: 9.4 mg/dL (ref 8.9–10.3)
GFR calc Af Amer: >60 mL/min (ref >60)
GFR calc non Af Amer: 53 mL/min — ABNORMAL LOW (ref >60)
Glucose, Bld: 95 mg/dL (ref 65–99)
Potassium: 3.8 mmol/L (ref 3.5–5.1)
SODIUM: 141 mmol/L (ref 135–145)
Total Bilirubin: 0.7 mg/dL (ref 0.3–1.2)
Total Protein: 6.6 g/dL (ref 6.5–8.1)

## 2017-04-15 MED ORDER — IBUPROFEN 100 MG/5ML PO SUSP: 400.0000 mg | Freq: Once | ORAL | Status: DC

## 2017-04-15 NOTE — ED Notes (Signed)
[facility] notified about transport.  

## 2017-04-15 NOTE — ED Notes (Signed)
Patient desat to 68% on the monitor. Per NT, patients lips were blue on assessment. 2L oxygen applied, patient saturations 100%.

## 2017-04-15 NOTE — ED Provider Notes (Signed)
[facility]-EMERGENCY DEPT Provider Note   CSN: [id number] Arrival date & time: 04/15/17  1142     History   Chief Complaint Chief Complaint  Patient presents with  . Fall    HPI Jacob York is a 81 y.o. male.  The history is provided by the patient and the EMS personnel. No language interpreter was used.  Fall    Jacob York is a 81 y.o. male who presents to the Emergency Department complaining of fall.  Level V caveat due to dementia.  He is a resident of a [address] and has been slightly more agitated than usual today. He had 2 falls that were witnessed at the facility. It is unclear if he hit is head or not. No loss of consciousness. He denies any complaints. The facility is concerned for possible urinary tract infection. Past Medical History:  Diagnosis Date  . Dementia   . Hypertension   . Stroke (HCC)   . Urinary retention 09/22/2014    Patient Active Problem List   Diagnosis Date Noted  . Agitation 12/02/2015  . UTI (lower urinary tract infection) 03/03/2015  . Pre-syncope 10/15/2014  . H/O urinary retention 09/22/2014  . Bigeminy 09/21/2014  . Syncope 09/21/2014  . Bradycardia 09/21/2014  . Chronic renal insufficiency, stage III (moderate) 09/21/2014  . Ventral hernia 06/20/2014  . Bilateral inguinal hernia (BIH) L>>R 05/24/2014  . Incarcerated ventral hernia - 4cm supraumbilical 05/24/2014  . Diastasis recti 05/24/2014  . Memory loss 05/24/2014  . Dementia with behavioral disturbance   . Hypertension     Past Surgical History:  Procedure Laterality Date  . INGUINAL HERNIA REPAIR N/A 06/20/2014   Procedure: LAPAROSCOPIC BILATERAL INGUINAL HERNIA REPAIR, LEFT FEMORAL AND INCARCERATED SUPRA UMBILICAL HERNIA REPAIR WITH MESH;  Surgeon: Karie Soda, MD;  Location: [facility];  Service: General;  Laterality: N/A;  . INSERTION OF MESH N/A 06/20/2014   Procedure: INSERTION OF MESH;  Surgeon: Karie Soda, MD;  Location: [facility];  Service: General;   Laterality: N/A;       Home Medications    Prior to Admission medications   Medication Sig Start Date End Date Taking? Authorizing Provider  acetaminophen (TYLENOL) 500 MG tablet Take 500 mg by mouth every 4 (four) hours as needed for mild pain, moderate pain, fever or headache.     [provider]  alum & mag hydroxide-simeth (MINTOX) 200-200-20 MG/5ML suspension Take 30 mLs by mouth every 6 (six) hours as needed for indigestion or heartburn.    [provider]  aspirin 81 MG chewable tablet Chew 81 mg by mouth daily.    [provider]  cetirizine (ZYRTEC) 10 MG tablet Take 10 mg by mouth daily as needed (itching).    [provider]  cholecalciferol (VITAMIN D) 1000 UNITS tablet Take 1,000 Units by mouth daily.    [provider]  divalproex (DEPAKOTE) 250 MG DR tablet Take 250-500 mg by mouth 2 (two) times daily. 250 mg each morning & 500 mg each night    [provider]  guaifenesin (ROBITUSSIN) 100 MG/5ML syrup Take 200 mg by mouth every 6 (six) hours as needed for cough.     [provider]  loperamide (IMODIUM) 2 MG capsule Take 2 mg by mouth as needed for diarrhea or loose stools (with each loose stool (Do not exceed 8 doses in 24 hours).).     [provider]  LORazepam (ATIVAN) 0.5 MG tablet Take 0.25 mg by mouth every 8 (  eight) hours as needed for anxiety (agitation).     [provider]  LORazepam (ATIVAN) 0.5 MG tablet Take 0.5 mg by mouth 2 (two) times daily.     [provider]  magnesium hydroxide (MILK OF MAGNESIA) 400 MG/5ML suspension Take 30 mLs by mouth at bedtime as needed for mild constipation or moderate constipation. Reported on 01/02/2016    [provider]  mirtazapine (REMERON) 30 MG tablet Take 30 mg by mouth at bedtime.    [provider]  NAMENDA XR 14 MG CP24 24 hr capsule Take 14 mg by mouth daily. 01/30/16   [provider]    neomycin-bacitracin-polymyxin (NEOSPORIN) [phone] ointment Apply 1 application topically daily as needed (for skin tears, abrasions, or minor irritation).     [provider]  oseltamivir (TAMIFLU) 75 MG capsule Take 1 capsule (75 mg total) by mouth every 12 (twelve) hours. Patient not taking: Reported on 11/26/2016 11/07/16   [name], MD  QUEtiapine (SEROQUEL) 25 MG tablet Take 25 mg by mouth 2 (two) times daily.    [provider]  Skin Protectants, Misc. (MINERIN) CREA Apply 1 application topically daily as needed (dry skin).    [provider]  tamsulosin (FLOMAX) 0.4 MG CAPS capsule Take 1 capsule (0.4 mg total) by mouth 2 (two) times daily. 03/21/16   [name], MD    Family History Family History  Problem Relation Age of Onset  . Stroke Mother   . Hypertension Mother     Social History Social History  Substance Use Topics  . Smoking status: Never Smoker  . Smokeless tobacco: Never Used  . Alcohol use No     Allergies   Patient has no known allergies.   Review of Systems Review of Systems  All other systems reviewed and are negative.    Physical Exam Updated Vital Signs BP (!) 145/79 (BP Location: Left Arm)   Pulse 66   Temp (!) 97.4 F (36.3 C) (Oral)   Resp 18   SpO2 100%   Physical Exam  Constitutional: He appears well-developed and well-nourished.  HENT:  Head: Normocephalic and atraumatic.  Cardiovascular: Normal rate and regular rhythm.   No murmur heard. Pulmonary/Chest: Effort normal and breath sounds normal. No respiratory distress.  Abdominal: Soft. There is no tenderness. There is no rebound and no guarding.  Musculoskeletal: He exhibits no edema or tenderness.  Neurological: He is alert.  5/5 strength in all four extremities with sensation to light touch intact in all four extremities.  Disoriented to time. Pleasantly confused  Skin: Skin is warm and dry.  Psychiatric: He has a normal mood and affect.  His behavior is normal.  Nursing note and vitals reviewed.    ED Treatments / Results  Labs (all labs ordered are listed, but only abnormal results are displayed) Labs Reviewed - No data to display  EKG  EKG Interpretation None       Radiology No results found.  Procedures Procedures (including critical care time)  Medications Ordered in ED Medications - No data to display   Initial Impression / Assessment and Plan / ED Course  I have reviewed the triage vital signs and the nursing notes.  Pertinent labs & imaging results that were available during my care of the patient were reviewed by me and considered in my medical decision making (see chart for details).     Patient with history of dementia here following fall 2 with mild increase in his agitation. UA  is not consistent with urinary tract infection, we will send cultures. Labs are at his baseline renal function. Patient without complaints in the department. No evidence of injury is related to fall. Plan to DC home with outpatient follow-up and return precautions.  Final Clinical Impressions(s) / ED Diagnoses   Final diagnoses:  Fall, initial encounter    New Prescriptions New Prescriptions   No medications on file     Tilden Fossaees, Tyshia Fenter, MD 04/15/17 1630

## 2017-04-15 NOTE — ED Notes (Signed)
Patient transported to CT 

## 2017-04-15 NOTE — ED Triage Notes (Signed)
Per EMS, patient came from [facility]. Pt has a history of dementia and combative outburts. Has been calm with EMS. Patient has fallen twice today, both witnessed, no LOC. No pain, here for evaluation. 130/80 HR 65, RR 16 98% RA. CBG=101.

## 2017-04-15 NOTE — ED Notes (Addendum)
Went to assist patient with trying to pbtain urine specimen. Patient brief and pants saturated in urine. Made DR Madilyn Hookees aware was given verbal order for in and out cath for urine specimen.

## 2017-04-15 NOTE — ED Notes (Signed)
Patient ambulated in hall with RN and NT assist.

## 2017-04-15 NOTE — ED Notes (Signed)
Bed: [id number] Expected date:  Expected time:  Means of arrival:  Comments: 81 yo m, fall, combative

## 2017-04-16 LAB — URINE CULTURE: CULTURE: NO GROWTH

## 2017-05-27 ENCOUNTER — Emergency Department (HOSPITAL_COMMUNITY): Payer: Medicare Other

## 2017-05-27 ENCOUNTER — Emergency Department (HOSPITAL_COMMUNITY)
Admission: EM | Admit: 2017-05-27 | Discharge: 2017-05-27 | Disposition: A | Discharge status: Home / Self Care | Payer: Medicare Other | Attending: Emergency Medicine | Admitting: Emergency Medicine

## 2017-05-27 ENCOUNTER — Encounter (HOSPITAL_COMMUNITY): Payer: Self-pay | Admitting: Emergency Medicine

## 2017-05-27 DIAGNOSIS — Z79899 Other long term (current) drug therapy: Secondary | ICD-10-CM | POA: Diagnosis not present

## 2017-05-27 DIAGNOSIS — Y92129 Unspecified place in nursing home as the place of occurrence of the external cause: Secondary | ICD-10-CM | POA: Diagnosis not present

## 2017-05-27 DIAGNOSIS — W1830XA Fall on same level, unspecified, initial encounter: Secondary | ICD-10-CM | POA: Diagnosis not present

## 2017-05-27 DIAGNOSIS — Z7982 Long term (current) use of aspirin: Secondary | ICD-10-CM | POA: Diagnosis not present

## 2017-05-27 DIAGNOSIS — Y999 Unspecified external cause status: Secondary | ICD-10-CM | POA: Diagnosis not present

## 2017-05-27 DIAGNOSIS — I10 Essential (primary) hypertension: Secondary | ICD-10-CM | POA: Insufficient documentation

## 2017-05-27 DIAGNOSIS — F0390 Unspecified dementia without behavioral disturbance: Secondary | ICD-10-CM | POA: Insufficient documentation

## 2017-05-27 DIAGNOSIS — R1031 Right lower quadrant pain: Secondary | ICD-10-CM | POA: Insufficient documentation

## 2017-05-27 DIAGNOSIS — Y939 Activity, unspecified: Secondary | ICD-10-CM | POA: Insufficient documentation

## 2017-05-27 DIAGNOSIS — W19XXXA Unspecified fall, initial encounter: Secondary | ICD-10-CM

## 2017-05-27 LAB — URINALYSIS, ROUTINE W REFLEX MICROSCOPIC
BILIRUBIN URINE: NEGATIVE
Glucose, UA: NEGATIVE mg/dL
HGB URINE DIPSTICK: NEGATIVE
KETONES UR: NEGATIVE mg/dL
Leukocytes, UA: NEGATIVE
NITRITE: NEGATIVE
PH: 8 (ref 5.0–8.0)
Protein, ur: NEGATIVE mg/dL
Specific Gravity, Urine: 1.008 (ref 1.005–1.030)

## 2017-05-27 NOTE — ED Notes (Signed)
[facility] called  

## 2017-05-27 NOTE — ED Provider Notes (Signed)
[facility]-EMERGENCY DEPT Provider Note   CSN: [id number] Arrival date & time: 05/27/17  0714     History   Chief Complaint Chief Complaint  Patient presents with  . Fall  . Flank Pain    HPI Jacob York is a 81 y.o. male.  81 year old male with history of dementiapresents complaining of bilateral lower back pain after fall. This was a witnessed mechanical fall which occurred yesterday. Visit time is had increasing ambulation. He has noted bilateral flank pain without radiation to his legs. Does not currently use any anticoagulants. There was no reported history of head trauma. He denies being short of breath. Pain is worse with movement better with remaining still. No known treatment use prior to arrival      Past Medical History:  Diagnosis Date  . Dementia   . Hypertension   . Stroke (HCC)   . Urinary retention 09/22/2014    Patient Active Problem List   Diagnosis Date Noted  . Agitation 12/02/2015  . UTI (lower urinary tract infection) 03/03/2015  . Pre-syncope 10/15/2014  . H/O urinary retention 09/22/2014  . Bigeminy 09/21/2014  . Syncope 09/21/2014  . Bradycardia 09/21/2014  . Chronic renal insufficiency, stage III (moderate) 09/21/2014  . Ventral hernia 06/20/2014  . Bilateral inguinal hernia (BIH) L>>R 05/24/2014  . Incarcerated ventral hernia - 4cm supraumbilical 05/24/2014  . Diastasis recti 05/24/2014  . Memory loss 05/24/2014  . Dementia with behavioral disturbance   . Hypertension     Past Surgical History:  Procedure Laterality Date  . INGUINAL HERNIA REPAIR N/A 06/20/2014   Procedure: LAPAROSCOPIC BILATERAL INGUINAL HERNIA REPAIR, LEFT FEMORAL AND INCARCERATED SUPRA UMBILICAL HERNIA REPAIR WITH MESH;  Surgeon: Karie Soda, MD;  Location: [facility];  Service: General;  Laterality: N/A;  . INSERTION OF MESH N/A 06/20/2014   Procedure: INSERTION OF MESH;  Surgeon: Karie Soda, MD;  Location: [facility];  Service: General;  Laterality: N/A;       Home  Medications    Prior to Admission medications   Medication Sig Start Date End Date Taking? Authorizing Provider  acetaminophen (TYLENOL) 500 MG tablet Take 500 mg by mouth every 4 (four) hours as needed for mild pain, moderate pain, fever or headache.     [provider]  alum & mag hydroxide-simeth (MINTOX) 200-200-20 MG/5ML suspension Take 30 mLs by mouth as needed for indigestion or heartburn.     [provider]  aspirin 81 MG chewable tablet Chew 81 mg by mouth daily with breakfast.     [provider]  cetirizine (ZYRTEC) 10 MG tablet Take 10 mg by mouth daily as needed (itching).    [provider]  cholecalciferol (VITAMIN D) 1000 UNITS tablet Take 1,000 Units by mouth daily with breakfast.     [provider]  divalproex (DEPAKOTE SPRINKLE) 125 MG capsule Take 250-500 mg by mouth 2 (two) times daily. Takes 250 mg in the morning and 500 mg at night. May mix in applesauce or pudding    [provider]  guaifenesin (ROBITUSSIN) 100 MG/5ML syrup Take 200 mg by mouth every 6 (six) hours as needed for cough.     [provider]  loperamide (IMODIUM) 2 MG capsule Take 2 mg by mouth as needed for diarrhea or loose stools.     [provider]  LORazepam (ATIVAN) 0.5 MG tablet Take 0.5 mg by mouth 2 (two) times daily. Takes at 1400 and 2000    [provider]  magnesium hydroxide (  MILK OF MAGNESIA) 400 MG/5ML suspension Take 30 mLs by mouth at bedtime as needed for moderate constipation. Reported on 01/02/2016    [provider]  mirtazapine (REMERON) 30 MG tablet Take 30 mg by mouth at bedtime.    [provider]  NAMENDA XR 14 MG CP24 24 hr capsule Take 14 mg by mouth daily with breakfast.  01/30/16   [provider]  neomycin-bacitracin-polymyxin (NEOSPORIN) [phone] ointment Apply 1 application topically daily as needed (for skin tears, abrasions, or minor irritation).     [provider]  QUEtiapine (SEROQUEL) 25 MG tablet Take 25 mg by mouth 2 (two) times daily.    [provider]  Skin Protectants, Misc. (MINERIN) CREA Apply 1 application topically daily as needed (dry skin).    [provider]  tamsulosin (FLOMAX) 0.4 MG CAPS capsule Take 1 capsule (0.4 mg total) by mouth 2 (two) times daily. 03/21/16   Jacob Nay, MD    Family History Family History  Problem Relation Age of Onset  . Stroke Mother   . Hypertension Mother     Social History Social History  Substance Use Topics  . Smoking status: Never Smoker  . Smokeless tobacco: Never Used  . Alcohol use No     Allergies   Patient has no known allergies.   Review of Systems Review of Systems  Unable to perform ROS: Dementia     Physical Exam Updated Vital Signs BP (!) 159/84 (BP Location: Left Arm)   Pulse (!) 57   Temp 98.2 F (36.8 C) (Oral)   Resp 18   SpO2 96%   Physical Exam  Constitutional: He is oriented to person, place, and time. He appears well-developed and well-nourished.  Non-toxic appearance. No distress.  HENT:  Head: Normocephalic and atraumatic.  Eyes: Pupils are equal, round, and reactive to light. Conjunctivae, EOM and lids are normal.  Neck: Normal range of motion. Neck supple. No tracheal deviation present. No thyroid mass present.  Cardiovascular: Normal rate, regular rhythm and normal heart sounds.  Exam reveals no gallop.   No murmur heard. Pulmonary/Chest: Effort normal and breath sounds normal. No stridor. No respiratory distress. He has no decreased breath sounds. He has no wheezes. He has no rhonchi. He has no rales.  Abdominal: Soft. Normal appearance and bowel sounds are normal. He exhibits no distension. There is no tenderness. There is no rebound and no CVA tenderness.  Musculoskeletal: Normal range of motion. He exhibits no edema or tenderness.       Back:  Neurological: He is alert and oriented to person, place, and time. He  has normal strength. No cranial nerve deficit or sensory deficit. GCS eye subscore is 4. GCS verbal subscore is 5. GCS motor subscore is 6.  Skin: Skin is warm and dry. No abrasion and no rash noted.  Psychiatric: He has a normal mood and affect. His speech is normal and behavior is normal.  Nursing note and vitals reviewed.    ED Treatments / Results  Labs (all labs ordered are listed, but only abnormal results are displayed) Labs Reviewed  URINALYSIS, ROUTINE W REFLEX MICROSCOPIC    EKG  EKG Interpretation None       Radiology No results found.  Procedures Procedures (including critical care time)  Medications Ordered in ED Medications - No data to display   Initial Impression / Assessment and Plan / ED Course  I have reviewed the triage vital signs and the nursing notes.  Pertinent  labs & imaging results that were available during my care of the patient were reviewed by me and considered in my medical decision making (see chart for details).    Lumbar spine series negative. Urinalysis negative.Patient stable for discharge  Final Clinical Impressions(s) / ED Diagnoses   Final diagnoses:  None    New Prescriptions New Prescriptions   No medications on file     Lorre Nick, MD 05/27/17 [phone]

## 2017-05-27 NOTE — ED Triage Notes (Signed)
Per EMS, pt from [facility] experienced a witnessed mechanical fall yesterday and has not left bed since. Right flank pain. No loss of consciousness or head injury.  Dementia with current mental status at baseline. No current use of anti-coagulant.

## 2017-05-27 NOTE — ED Notes (Signed)
Bed: TL57 Expected date:  Expected time:  Means of arrival:  Comments: 81 yo M/Fall

## 2017-11-16 ENCOUNTER — Encounter (HOSPITAL_COMMUNITY): Payer: Self-pay

## 2017-11-16 ENCOUNTER — Emergency Department (HOSPITAL_COMMUNITY): Payer: Medicare Other

## 2017-11-16 ENCOUNTER — Emergency Department (HOSPITAL_COMMUNITY)
Admission: EM | Admit: 2017-11-16 | Discharge: 2017-11-17 | Disposition: A | Discharge status: Home / Self Care | Payer: Medicare Other | Attending: Emergency Medicine | Admitting: Emergency Medicine

## 2017-11-16 DIAGNOSIS — Y999 Unspecified external cause status: Secondary | ICD-10-CM | POA: Insufficient documentation

## 2017-11-16 DIAGNOSIS — Z79899 Other long term (current) drug therapy: Secondary | ICD-10-CM | POA: Insufficient documentation

## 2017-11-16 DIAGNOSIS — Z8673 Personal history of transient ischemic attack (TIA), and cerebral infarction without residual deficits: Secondary | ICD-10-CM | POA: Diagnosis not present

## 2017-11-16 DIAGNOSIS — F0390 Unspecified dementia without behavioral disturbance: Secondary | ICD-10-CM | POA: Diagnosis not present

## 2017-11-16 DIAGNOSIS — S0083XA Contusion of other part of head, initial encounter: Secondary | ICD-10-CM | POA: Insufficient documentation

## 2017-11-16 DIAGNOSIS — S0990XA Unspecified injury of head, initial encounter: Secondary | ICD-10-CM

## 2017-11-16 DIAGNOSIS — N183 Chronic kidney disease, stage 3 (moderate): Secondary | ICD-10-CM | POA: Diagnosis not present

## 2017-11-16 DIAGNOSIS — Y92129 Unspecified place in nursing home as the place of occurrence of the external cause: Secondary | ICD-10-CM | POA: Diagnosis not present

## 2017-11-16 DIAGNOSIS — Y939 Activity, unspecified: Secondary | ICD-10-CM | POA: Diagnosis not present

## 2017-11-16 DIAGNOSIS — Z7982 Long term (current) use of aspirin: Secondary | ICD-10-CM | POA: Insufficient documentation

## 2017-11-16 DIAGNOSIS — W19XXXA Unspecified fall, initial encounter: Secondary | ICD-10-CM | POA: Diagnosis not present

## 2017-11-16 DIAGNOSIS — Z23 Encounter for immunization: Secondary | ICD-10-CM | POA: Insufficient documentation

## 2017-11-16 DIAGNOSIS — I129 Hypertensive chronic kidney disease with stage 1 through stage 4 chronic kidney disease, or unspecified chronic kidney disease: Secondary | ICD-10-CM | POA: Insufficient documentation

## 2017-11-16 MED ORDER — TETANUS-DIPHTH-ACELL PERTUSSIS 5-2.5-18.5 LF-MCG/0.5 IM SUSP
0.5000 mL | Freq: Once | INTRAMUSCULAR | Status: AC
  Administered 2017-11-17: 0.5 mL via INTRAMUSCULAR
  Filled 2017-11-16: qty 0.5

## 2017-11-16 NOTE — ED Notes (Signed)
Bed: [id number] Expected date:  Expected time:  Means of arrival:  Comments: 135m fall with lac

## 2017-11-16 NOTE — ED Triage Notes (Signed)
Patient BIB EMS for an unwitnessed fall [facility]. Patient has a laceration to forehead.

## 2017-11-16 NOTE — ED Provider Notes (Signed)
[facility]-EMERGENCY DEPT Provider Note   CSN: [id number] Arrival date & time: 11/16/17  2241     History   Chief Complaint No chief complaint on file.   HPI Jacob York is a 82 y.o. male.  Level 5 caveat for dementia.  Patient brought in from living facility after unwitnessed fall.  He does not remember what happened.  Is oriented to person only.  He sustained an abrasion and a laceration to his forehead.  No reported loss of consciousness.  Does not use any anticoagulation.  Denies any back pain but does have diffuse neck pain.  No focal weakness, numbness or tingling.  No chest pain or shortness of breath.   The history is provided by the patient and the EMS personnel. The history is limited by the condition of the patient.    Past Medical History:  Diagnosis Date  . Dementia   . Hypertension   . Stroke (HCC)   . Urinary retention 09/22/2014    Patient Active Problem List   Diagnosis Date Noted  . Agitation 12/02/2015  . UTI (lower urinary tract infection) 03/03/2015  . Pre-syncope 10/15/2014  . H/O urinary retention 09/22/2014  . Bigeminy 09/21/2014  . Syncope 09/21/2014  . Bradycardia 09/21/2014  . Chronic renal insufficiency, stage III (moderate) (HCC) 09/21/2014  . Ventral hernia 06/20/2014  . Bilateral inguinal hernia (BIH) L>>R 05/24/2014  . Incarcerated ventral hernia - 4cm supraumbilical 05/24/2014  . Diastasis recti 05/24/2014  . Memory loss 05/24/2014  . Dementia with behavioral disturbance   . Hypertension     Past Surgical History:  Procedure Laterality Date  . INGUINAL HERNIA REPAIR N/A 06/20/2014   Procedure: LAPAROSCOPIC BILATERAL INGUINAL HERNIA REPAIR, LEFT FEMORAL AND INCARCERATED SUPRA UMBILICAL HERNIA REPAIR WITH MESH;  Surgeon: Karie Soda, MD;  Location: [facility]S;  Service: General;  Laterality: N/A;  . INSERTION OF MESH N/A 06/20/2014   Procedure: INSERTION OF MESH;  Surgeon: Karie Soda, MD;  Location: [facility] ORS;   Service: General;  Laterality: N/A;       Home Medications    Prior to Admission medications   Medication Sig Start Date End Date Taking? Authorizing Provider  acetaminophen (TYLENOL) 500 MG tablet Take 500 mg by mouth every 4 (four) hours as needed for mild pain, moderate pain, fever or headache.     [provider]  alum & mag hydroxide-simeth (MINTOX) 200-200-20 MG/5ML suspension Take 30 mLs by mouth as needed for indigestion or heartburn.     [provider]  aspirin 81 MG chewable tablet Chew 81 mg by mouth daily with breakfast.     [provider]  cetirizine (ZYRTEC) 10 MG tablet Take 10 mg by mouth daily as needed (itching).    [provider]  cholecalciferol (VITAMIN D) 1000 UNITS tablet Take 1,000 Units by mouth daily with breakfast.     [provider]  divalproex (DEPAKOTE SPRINKLE) 125 MG capsule Take 250-500 mg by mouth 2 (two) times daily. Takes 250 mg in the morning and 500 mg at night. May mix in applesauce or pudding    [provider]  guaifenesin (ROBITUSSIN) 100 MG/5ML syrup Take 200 mg by mouth every 6 (six) hours as needed for cough.     [provider]  loperamide (IMODIUM) 2 MG capsule Take 2 mg by mouth as needed for diarrhea or loose stools.     [provider]  LORazepam (ATIVAN) 0.5 MG tablet Take 0.5 mg by mouth 2 (  two) times daily. Takes at 1400 and 2000    [provider]  magnesium hydroxide (MILK OF MAGNESIA) 400 MG/5ML suspension Take 30 mLs by mouth at bedtime as needed for moderate constipation. Reported on 01/02/2016    [provider]  mirtazapine (REMERON) 30 MG tablet Take 30 mg by mouth at bedtime.    [provider]  NAMENDA XR 14 MG CP24 24 hr capsule Take 14 mg by mouth daily with breakfast.  01/30/16   [provider]  neomycin-bacitracin-polymyxin (NEOSPORIN) [phone] ointment Apply 1 application topically daily as needed (for skin tears,  abrasions, or minor irritation).     [provider]  QUEtiapine (SEROQUEL) 25 MG tablet Take 25 mg by mouth 2 (two) times daily.    [provider]  Skin Protectants, Misc. (MINERIN) CREA Apply 1 application topically daily as needed (dry skin).    [provider]  tamsulosin (FLOMAX) 0.4 MG CAPS capsule Take 1 capsule (0.4 mg total) by mouth 2 (two) times daily. 03/21/16   Jacob Nay, MD    Family History Family History  Problem Relation Age of Onset  . Stroke Mother   . Hypertension Mother     Social History Social History   Tobacco Use  . Smoking status: Never Smoker  . Smokeless tobacco: Never Used  Substance Use Topics  . Alcohol use: No  . Drug use: No     Allergies   Patient has no known allergies.   Review of Systems Review of Systems  Unable to perform ROS: Dementia     Physical Exam Updated Vital Signs BP (!) 151/83 (BP Location: Right Arm)   Pulse 76   Temp 98.3 F (36.8 C) (Oral)   Resp 20   SpO2 100%   Physical Exam  Constitutional: He appears well-developed and well-nourished. No distress.  Oriented x1  HENT:  Head: Normocephalic and atraumatic.  Mouth/Throat: Oropharynx is clear and moist. No oropharyngeal exudate.  Hematoma and abrasion to central forehead  Eyes: Conjunctivae and EOM are normal. Pupils are equal, round, and reactive to light.  Neck: Normal range of motion. Neck supple.  Diffuse paraspinal tenderness No midline tenderness  Cardiovascular: Normal rate, regular rhythm, normal heart sounds and intact distal pulses.  No murmur heard. Pulmonary/Chest: Effort normal and breath sounds normal. No respiratory distress.  Abdominal: Soft. There is no tenderness. There is no rebound and no guarding.  Pelvis is stable, full range of motion of hips without pain  Musculoskeletal: Normal range of motion. He exhibits no edema or tenderness.  Neurological: He is alert. No cranial nerve deficit. He exhibits  normal muscle tone. Coordination normal.  Oriented to person only strength throughout, cranial nerves II to XII intact  Skin: Skin is warm.  Psychiatric: He has a normal mood and affect. His behavior is normal.  Nursing note and vitals reviewed.    ED Treatments / Results  Labs (all labs ordered are listed, but only abnormal results are displayed) Labs Reviewed - No data to display  EKG  EKG Interpretation None       Radiology Dg Chest 2 View  Result Date: 11/16/2017 CLINICAL DATA:  Status post fall, with confusion. EXAM: CHEST  2 VIEW COMPARISON:  Chest radiograph performed 04/15/2017 FINDINGS: The lungs are well-aerated and clear. There is no evidence of focal opacification, pleural effusion or pneumothorax. The heart is normal in size; the mediastinal contour is within normal limits. No acute osseous abnormalities are seen. IMPRESSION: No acute cardiopulmonary  process seen. No displaced rib fractures identified. Electronically Signed   By: Roanna RaiderJeffery  Chang M.D.   On: 11/16/2017 23:41   Ct Head Wo Contrast  Result Date: 11/16/2017 CLINICAL DATA:  82 y/o M; unwitnessed fall tonight with laceration to forehead. History of dementia and multiple falls. EXAM: CT HEAD WITHOUT CONTRAST CT CERVICAL SPINE WITHOUT CONTRAST TECHNIQUE: Multidetector CT imaging of the head and cervical spine was performed following the standard protocol without intravenous contrast. Multiplanar CT image reconstructions of the cervical spine were also generated. COMPARISON:  04/15/2017 CT of the head and cervical spine. FINDINGS: CT HEAD FINDINGS Brain: No evidence of acute infarction, hemorrhage, hydrocephalus, extra-axial collection or mass lesion/mass effect. Stable small chronic infarctions within the right frontal lobe, chronic microvascular ischemic changes of the brain, and parenchymal volume loss. Vascular: Calcific atherosclerosis of carotid siphons. No hyperdense vessel. Skull: Left frontal scalp small  contusion. No calvarial fracture. Stable small calvarial defect of the right frontal bone. Stable mildly leftward displaced chronic fracture of nasal bones. Sinuses/Orbits: No acute finding. Other: None. CT CERVICAL SPINE FINDINGS Alignment: Stable grade 1 anterolisthesis at C4-5, C5-6 and C7-T1. Skull base and vertebrae: No acute fracture. No primary bone lesion or focal pathologic process. Soft tissues and spinal canal: No prevertebral fluid or swelling. No visible canal hematoma. Disc levels: Moderate cervical spondylosis with multilevel disc and facet degenerative changes greatest at the C6-7 level. No high-grade bony canal stenosis. Uncovertebral and facet hypertrophy encroaches on the neural foramen at the left C2-3, bilateral C3-4, bilateral C4-5, left C5-6, bilateral C6-7 levels. Upper chest: Negative. Other: Moderate calcific atherosclerosis of aortic arch and carotid bifurcations. IMPRESSION: 1. Left frontal scalp contusion.  No calvarial fracture. 2. No acute intracranial abnormality. 3. No acute fracture or dislocation of cervical spine. 4. Stable small chronic infarcts in right frontal lobe, chronic microvascular ischemic changes of the brain, and parenchymal volume loss. 5. Stable moderate cervical spondylosis. 6. Aortic and carotid bifurcation calcific atherosclerosis. Electronically Signed   By: Mitzi HansenLance  Furusawa-Stratton M.D.   On: 11/16/2017 23:58   Ct Cervical Spine Wo Contrast  Result Date: 11/16/2017 CLINICAL DATA:  82 y/o M; unwitnessed fall tonight with laceration to forehead. History of dementia and multiple falls. EXAM: CT HEAD WITHOUT CONTRAST CT CERVICAL SPINE WITHOUT CONTRAST TECHNIQUE: Multidetector CT imaging of the head and cervical spine was performed following the standard protocol without intravenous contrast. Multiplanar CT image reconstructions of the cervical spine were also generated. COMPARISON:  04/15/2017 CT of the head and cervical spine. FINDINGS: CT HEAD FINDINGS Brain:  No evidence of acute infarction, hemorrhage, hydrocephalus, extra-axial collection or mass lesion/mass effect. Stable small chronic infarctions within the right frontal lobe, chronic microvascular ischemic changes of the brain, and parenchymal volume loss. Vascular: Calcific atherosclerosis of carotid siphons. No hyperdense vessel. Skull: Left frontal scalp small contusion. No calvarial fracture. Stable small calvarial defect of the right frontal bone. Stable mildly leftward displaced chronic fracture of nasal bones. Sinuses/Orbits: No acute finding. Other: None. CT CERVICAL SPINE FINDINGS Alignment: Stable grade 1 anterolisthesis at C4-5, C5-6 and C7-T1. Skull base and vertebrae: No acute fracture. No primary bone lesion or focal pathologic process. Soft tissues and spinal canal: No prevertebral fluid or swelling. No visible canal hematoma. Disc levels: Moderate cervical spondylosis with multilevel disc and facet degenerative changes greatest at the C6-7 level. No high-grade bony canal stenosis. Uncovertebral and facet hypertrophy encroaches on the neural foramen at the left C2-3, bilateral C3-4, bilateral C4-5, left C5-6, bilateral C6-7 levels. Upper  chest: Negative. Other: Moderate calcific atherosclerosis of aortic arch and carotid bifurcations. IMPRESSION: 1. Left frontal scalp contusion.  No calvarial fracture. 2. No acute intracranial abnormality. 3. No acute fracture or dislocation of cervical spine. 4. Stable small chronic infarcts in right frontal lobe, chronic microvascular ischemic changes of the brain, and parenchymal volume loss. 5. Stable moderate cervical spondylosis. 6. Aortic and carotid bifurcation calcific atherosclerosis. Electronically Signed   By: Mitzi Hansen M.D.   On: 11/16/2017 23:58    Procedures Procedures (including critical care time)  Medications Ordered in ED Medications  Tdap (BOOSTRIX) injection 0.5 mL (not administered)     Initial Impression / Assessment  and Plan / ED Course  I have reviewed the triage vital signs and the nursing notes.  Pertinent labs & imaging results that were available during my care of the patient were reviewed by me and considered in my medical decision making (see chart for details).    Patient with dementia and unwitnessed fall.  Evidence of head trauma.  No focal neurological deficits  CT head and C-spine are negative for acute pathology.  Tetanus is updated.  Scalp wound cleaned and does not require sutures.  Patient appears to be at his baseline.  He is tolerating p.o. and ambulatory. Pelvis is stable. He appears stable to return to his facility.  Final Clinical Impressions(s) / ED Diagnoses   Final diagnoses:  Fall, initial encounter  Closed head injury, initial encounter    ED Discharge Orders    None       [place] Urieta, Jeannett Senior, MD 11/17/17 [phone]

## 2017-11-17 DIAGNOSIS — S0083XA Contusion of other part of head, initial encounter: Secondary | ICD-10-CM | POA: Diagnosis not present

## 2017-11-17 NOTE — ED Notes (Signed)
[facility] Expected date:  Expected time:  Means of arrival:  Comments: [id number] f n/v

## 2017-12-31 ENCOUNTER — Other Ambulatory Visit: Payer: Self-pay

## 2017-12-31 ENCOUNTER — Emergency Department (HOSPITAL_COMMUNITY)
Admission: EM | Admit: 2017-12-31 | Discharge: 2018-01-01 | Disposition: A | Discharge status: Skilled Nursing Facility | Payer: Medicare Other | Attending: Emergency Medicine | Admitting: Emergency Medicine

## 2017-12-31 ENCOUNTER — Emergency Department (HOSPITAL_COMMUNITY): Payer: Medicare Other

## 2017-12-31 ENCOUNTER — Encounter (HOSPITAL_COMMUNITY): Payer: Self-pay

## 2017-12-31 DIAGNOSIS — I129 Hypertensive chronic kidney disease with stage 1 through stage 4 chronic kidney disease, or unspecified chronic kidney disease: Secondary | ICD-10-CM | POA: Diagnosis not present

## 2017-12-31 DIAGNOSIS — R110 Nausea: Secondary | ICD-10-CM | POA: Diagnosis not present

## 2017-12-31 DIAGNOSIS — W01190A Fall on same level from slipping, tripping and stumbling with subsequent striking against furniture, initial encounter: Secondary | ICD-10-CM | POA: Insufficient documentation

## 2017-12-31 DIAGNOSIS — F0390 Unspecified dementia without behavioral disturbance: Secondary | ICD-10-CM | POA: Insufficient documentation

## 2017-12-31 DIAGNOSIS — Z8673 Personal history of transient ischemic attack (TIA), and cerebral infarction without residual deficits: Secondary | ICD-10-CM | POA: Diagnosis not present

## 2017-12-31 DIAGNOSIS — W19XXXA Unspecified fall, initial encounter: Secondary | ICD-10-CM

## 2017-12-31 DIAGNOSIS — R410 Disorientation, unspecified: Secondary | ICD-10-CM | POA: Diagnosis not present

## 2017-12-31 DIAGNOSIS — Y939 Activity, unspecified: Secondary | ICD-10-CM | POA: Diagnosis not present

## 2017-12-31 DIAGNOSIS — Z7982 Long term (current) use of aspirin: Secondary | ICD-10-CM | POA: Diagnosis not present

## 2017-12-31 DIAGNOSIS — Y998 Other external cause status: Secondary | ICD-10-CM | POA: Diagnosis not present

## 2017-12-31 DIAGNOSIS — S0990XA Unspecified injury of head, initial encounter: Secondary | ICD-10-CM | POA: Diagnosis not present

## 2017-12-31 DIAGNOSIS — N183 Chronic kidney disease, stage 3 (moderate): Secondary | ICD-10-CM | POA: Diagnosis not present

## 2017-12-31 DIAGNOSIS — Z79899 Other long term (current) drug therapy: Secondary | ICD-10-CM | POA: Diagnosis not present

## 2017-12-31 DIAGNOSIS — Y929 Unspecified place or not applicable: Secondary | ICD-10-CM | POA: Insufficient documentation

## 2017-12-31 NOTE — ED Notes (Signed)
[facility] Expected date:  Expected time:  Means of arrival:  Comments[facility]

## 2017-12-31 NOTE — ED Provider Notes (Signed)
[facility]-EMERGENCY DEPT Provider Note   CSN: [id number] Arrival date & time: 12/31/17  2019     History   Chief Complaint Chief Complaint  Patient presents with  . Fall  . Head Injury  . Nausea    HPI SYRE KNERR is a 82 y.o. male.  Level 5 caveat secondary to dementia.  Per EMS the patient was at his facility dayroom where he was noted to trip over his own feet and struck a chair with his head as he fell to the ground.  Reportedly the patient was complaining of nausea vomiting.  There was no reported LOC.  Here the patient does not recall the fall and denies any complaints.  The history is provided by the patient and the EMS personnel.  Fall  This is a new problem. The current episode started 1 to 2 hours ago. Pertinent negatives include no chest pain, no abdominal pain, no headaches and no shortness of breath. Nothing aggravates the symptoms. Nothing relieves the symptoms.    Past Medical History:  Diagnosis Date  . Dementia   . Hypertension   . Stroke (HCC)   . Urinary retention 09/22/2014    Patient Active Problem List   Diagnosis Date Noted  . Agitation 12/02/2015  . UTI (lower urinary tract infection) 03/03/2015  . Pre-syncope 10/15/2014  . H/O urinary retention 09/22/2014  . Bigeminy 09/21/2014  . Syncope 09/21/2014  . Bradycardia 09/21/2014  . Chronic renal insufficiency, stage III (moderate) (HCC) 09/21/2014  . Ventral hernia 06/20/2014  . Bilateral inguinal hernia (BIH) L>>R 05/24/2014  . Incarcerated ventral hernia - 4cm supraumbilical 05/24/2014  . Diastasis recti 05/24/2014  . Memory loss 05/24/2014  . Dementia with behavioral disturbance   . Hypertension     Past Surgical History:  Procedure Laterality Date  . INGUINAL HERNIA REPAIR N/A 06/20/2014   Procedure: LAPAROSCOPIC BILATERAL INGUINAL HERNIA REPAIR, LEFT FEMORAL AND INCARCERATED SUPRA UMBILICAL HERNIA REPAIR WITH MESH;  Surgeon: Karie Soda, MD;  Location: [facility];   Service: General;  Laterality: N/A;  . INSERTION OF MESH N/A 06/20/2014   Procedure: INSERTION OF MESH;  Surgeon: Karie Soda, MD;  Location: [facility];  Service: General;  Laterality: N/A;        Home Medications    Prior to Admission medications   Medication Sig Start Date End Date Taking? Authorizing Provider  acetaminophen (TYLENOL) 500 MG tablet Take 500 mg by mouth every 4 (four) hours as needed for mild pain, moderate pain, fever or headache.     [provider]  alum & mag hydroxide-simeth (MINTOX) 200-200-20 MG/5ML suspension Take 30 mLs by mouth as needed for indigestion or heartburn.     [provider]  aspirin 81 MG chewable tablet Chew 81 mg by mouth daily with breakfast.     [provider]  cetirizine (ZYRTEC) 10 MG tablet Take 10 mg by mouth daily as needed (itching).    [provider]  cholecalciferol (VITAMIN D) 1000 UNITS tablet Take 1,000 Units by mouth daily with breakfast.     [provider]  divalproex (DEPAKOTE SPRINKLE) 125 MG capsule Take 250-500 mg by mouth 2 (two) times daily. Takes 250 mg in the morning and 500 mg at night. May mix in applesauce or pudding    [provider]  guaifenesin (ROBITUSSIN) 100 MG/5ML syrup Take 200 mg by mouth every 6 (six) hours as needed for cough.     [provider]  loperamide (IMODIUM) 2  MG capsule Take 2 mg by mouth as needed for diarrhea or loose stools.     [provider]  LORazepam (ATIVAN) 0.5 MG tablet Take 0.5 mg by mouth 2 (two) times daily. Takes at 1400 and 2000    [provider]  magnesium hydroxide (MILK OF MAGNESIA) 400 MG/5ML suspension Take 30 mLs by mouth at bedtime as needed for moderate constipation. Reported on 01/02/2016    [provider]  mirtazapine (REMERON) 30 MG tablet Take 30 mg by mouth at bedtime.    [provider]  NAMENDA XR 14 MG CP24 24 hr capsule Take 14 mg by mouth daily with breakfast.  01/30/16    [provider]  neomycin-bacitracin-polymyxin (NEOSPORIN) [phone] ointment Apply 1 application topically daily as needed (for skin tears, abrasions, or minor irritation).     [provider]  QUEtiapine (SEROQUEL) 25 MG tablet Take 25 mg by mouth 2 (two) times daily.    [provider]  Skin Protectants, Misc. (MINERIN) CREA Apply 1 application topically daily as needed (dry skin).    [provider]  tamsulosin (FLOMAX) 0.4 MG CAPS capsule Take 1 capsule (0.4 mg total) by mouth 2 (two) times daily. 03/21/16   Nelva NayBeaton, Robert, MD    Family History Family History  Problem Relation Age of Onset  . Stroke Mother   . Hypertension Mother     Social History Social History   Tobacco Use  . Smoking status: Never Smoker  . Smokeless tobacco: Never Used  Substance Use Topics  . Alcohol use: No  . Drug use: No     Allergies   Patient has no known allergies.   Review of Systems Review of Systems  Unable to perform ROS: Dementia  Respiratory: Negative for shortness of breath.   Cardiovascular: Negative for chest pain.  Gastrointestinal: Negative for abdominal pain.  Neurological: Negative for headaches.     Physical Exam Updated Vital Signs BP (!) 153/86 (BP Location: Left Arm)   Pulse (!) 59   Temp 98 F (36.7 C) (Oral)   Resp 20   SpO2 100%   Physical Exam  Constitutional: He appears well-developed and well-nourished.  HENT:  Head: Normocephalic.  Right Ear: External ear normal.  Left Ear: External ear normal.  Nose: Nose normal.  Mouth/Throat: Oropharynx is clear and moist.  Patient is a prior Craney scar in his right frontotemporal area.  Also has a hematoma on his posterior occiput.  No active bleeding.  Eyes: Pupils are equal, round, and reactive to light. EOM are normal. No scleral icterus.  Neck: Normal range of motion. Neck supple. No tracheal deviation present.  Cardiovascular: Normal rate, regular rhythm, normal heart  sounds and intact distal pulses.  Pulmonary/Chest: Effort normal and breath sounds normal. He has no rales.  Abdominal: Soft. He exhibits no mass. There is no tenderness. There is no guarding.  Musculoskeletal: Normal range of motion. He exhibits no tenderness or deformity.  Neurological: He is alert. He has normal strength. He is disoriented (Oriented to self only). No cranial nerve deficit or sensory deficit. GCS eye subscore is 4. GCS verbal subscore is 5. GCS motor subscore is 6.  Skin: Skin is warm and dry. Capillary refill takes less than 2 seconds.  Psychiatric: He has a normal mood and affect.     ED Treatments / Results  Labs (all labs ordered are listed, but only abnormal results are displayed) Labs Reviewed - No data to display  EKG None  Radiology Ct Head Wo Contrast  Result Date: 12/31/2017 CLINICAL DATA:  Trip and fall. Nausea. RIGHT parietal scalp hematoma. History of stroke, hypertension, dementia. EXAM: CT HEAD WITHOUT CONTRAST CT CERVICAL SPINE WITHOUT CONTRAST TECHNIQUE: Multidetector CT imaging of the head and cervical spine was performed following the standard protocol without intravenous contrast. Multiplanar CT image reconstructions of the cervical spine were also generated. COMPARISON:  CT HEAD and cervical spine November 16, 2017. FINDINGS: CT HEAD FINDINGS BRAIN: No intraparenchymal hemorrhage, mass effect nor midline shift. Moderate parenchymal brain volume loss. No hydrocephalus. Patchy to confluent supratentorial white matter hypodensities compatible with moderate chronic small vessel ischemic disease. Multifocal RIGHT frontal lobe encephalomalacia. Old lacunar infarcts. Mild asymmetrically smaller LEFT cerebral peduncle most compatible with wallerian degeneration. No acute large vascular territory infarcts. No abnormal extra-axial fluid collections. Basal cisterns are patent. VASCULAR: Moderate calcific atherosclerosis of the carotid siphons. SKULL: No skull  fracture. No significant scalp soft tissue swelling. SINUSES/ORBITS: The mastoid air-cells and included paranasal sinuses are well-aerated.The included ocular globes and orbital contents are non-suspicious. OTHER: None. CT CERVICAL SPINE FINDINGS ALIGNMENT: Maintained lordosis. Vertebral bodies in alignment. SKULL BASE AND VERTEBRAE: Cervical vertebral bodies and posterior elements are intact. Severe C6-7 disc height loss with auto interbody arthrodesis. Moderate C3-4 degenerative disc. C1-2 articulation maintained with severe atlantodental osteoarthrosis. Multilevel moderate to severe facet arthropathy. No destructive bony lesions. SOFT TISSUES AND SPINAL CANAL: Nonacute. Mild carotid bifurcation atherosclerosis. DISC LEVELS: No significant osseous canal stenosis. Moderate LEFT C2-3, severe LEFT C3-4, moderate bilateral C4-5 and moderate to severe C6-7 neural foraminal narrowing. UPPER CHEST: Lung apices are clear. OTHER: None. IMPRESSION: CT HEAD: 1. No acute intracranial process. 2. Stable examination including old RIGHT frontal lobe infarcts and lacunar infarcts. CT CERVICAL SPINE: 1. No acute fracture or malalignment. 2. Stable degenerative change of the cervical spine. Electronically Signed   By: Awilda Metro M.D.   On: 12/31/2017 22:39   Ct Cervical Spine Wo Contrast  Result Date: 12/31/2017 CLINICAL DATA:  Trip and fall. Nausea. RIGHT parietal scalp hematoma. History of stroke, hypertension, dementia. EXAM: CT HEAD WITHOUT CONTRAST CT CERVICAL SPINE WITHOUT CONTRAST TECHNIQUE: Multidetector CT imaging of the head and cervical spine was performed following the standard protocol without intravenous contrast. Multiplanar CT image reconstructions of the cervical spine were also generated. COMPARISON:  CT HEAD and cervical spine November 16, 2017. FINDINGS: CT HEAD FINDINGS BRAIN: No intraparenchymal hemorrhage, mass effect nor midline shift. Moderate parenchymal brain volume loss. No hydrocephalus.  Patchy to confluent supratentorial white matter hypodensities compatible with moderate chronic small vessel ischemic disease. Multifocal RIGHT frontal lobe encephalomalacia. Old lacunar infarcts. Mild asymmetrically smaller LEFT cerebral peduncle most compatible with wallerian degeneration. No acute large vascular territory infarcts. No abnormal extra-axial fluid collections. Basal cisterns are patent. VASCULAR: Moderate calcific atherosclerosis of the carotid siphons. SKULL: No skull fracture. No significant scalp soft tissue swelling. SINUSES/ORBITS: The mastoid air-cells and included paranasal sinuses are well-aerated.The included ocular globes and orbital contents are non-suspicious. OTHER: None. CT CERVICAL SPINE FINDINGS ALIGNMENT: Maintained lordosis. Vertebral bodies in alignment. SKULL BASE AND VERTEBRAE: Cervical vertebral bodies and posterior elements are intact. Severe C6-7 disc height loss with auto interbody arthrodesis. Moderate C3-4 degenerative disc. C1-2 articulation maintained with severe atlantodental osteoarthrosis. Multilevel moderate to severe facet arthropathy. No destructive bony lesions. SOFT TISSUES AND SPINAL CANAL: Nonacute. Mild carotid bifurcation atherosclerosis. DISC LEVELS: No significant osseous canal stenosis. Moderate LEFT C2-3, severe LEFT C3-4, moderate bilateral C4-5 and moderate to severe C6-7 neural foraminal  narrowing. UPPER CHEST: Lung apices are clear. OTHER: None. IMPRESSION: CT HEAD: 1. No acute intracranial process. 2. Stable examination including old RIGHT frontal lobe infarcts and lacunar infarcts. CT CERVICAL SPINE: 1. No acute fracture or malalignment. 2. Stable degenerative change of the cervical spine. Electronically Signed   By: Awilda Metro M.D.   On: 12/31/2017 22:39    Procedures Procedures (including critical care time)  Medications Ordered in ED Medications - No data to display   Initial Impression / Assessment and Plan / ED Course  I have  reviewed the triage vital signs and the nursing notes.  Pertinent labs & imaging results that were available during my care of the patient were reviewed by me and considered in my medical decision making (see chart for details).  Clinical Course as of Jan 01 1154  Wed Dec 31, 2017  2248 Poor historian with reported fall and complaint of nausea per His caregivers.  Patient denies any complaints here but because of his dementia will like to CT his head and neck for possible bleed or fracture.   [MB]  2248 CT imaging of head is   [MB]  2248  significant for prior infarcts but nothing acute and CT C-spine is negative for fracture.   [MB]  2249 Will return to facility.   [MB]    Clinical Course User Index [MB] Terrilee Files, MD     Final Clinical Impressions(s) / ED Diagnoses   Final diagnoses:  Fall, initial encounter  Injury of head, initial encounter    ED Discharge Orders    None       Terrilee Files, MD 01/01/18 1156

## 2017-12-31 NOTE — Discharge Instructions (Addendum)
Your evaluated in the emergency department for head injury after a fall.  Your CAT scan did not show any acute abnormalities.  We recommend you continue your regular medications and follow-up with your doctor.  Please return if any worsening symptoms.

## 2017-12-31 NOTE — ED Notes (Signed)
Patient in ct 

## 2017-12-31 NOTE — ED Triage Notes (Signed)
[facility] of [facility]-patient in dayroom and tripped over his feet-patient hit chair during fall-has pain and hematoma to right parietal area-now patient complaining of nausea. BP 143/77 HR 58 RR18. [facility] states patient has no complaints-was ambulatory to his room and stretcher.

## 2018-01-18 ENCOUNTER — Emergency Department (HOSPITAL_COMMUNITY)
Admission: EM | Admit: 2018-01-18 | Discharge: 2018-01-18 | Disposition: A | Discharge status: Home / Self Care | Payer: Medicare Other | Attending: Emergency Medicine | Admitting: Emergency Medicine

## 2018-01-18 ENCOUNTER — Encounter (HOSPITAL_COMMUNITY): Payer: Self-pay | Admitting: *Deleted

## 2018-01-18 ENCOUNTER — Other Ambulatory Visit: Payer: Self-pay

## 2018-01-18 ENCOUNTER — Emergency Department (HOSPITAL_COMMUNITY): Payer: Medicare Other

## 2018-01-18 DIAGNOSIS — N183 Chronic kidney disease, stage 3 (moderate): Secondary | ICD-10-CM | POA: Diagnosis not present

## 2018-01-18 DIAGNOSIS — Z79899 Other long term (current) drug therapy: Secondary | ICD-10-CM | POA: Diagnosis not present

## 2018-01-18 DIAGNOSIS — I129 Hypertensive chronic kidney disease with stage 1 through stage 4 chronic kidney disease, or unspecified chronic kidney disease: Secondary | ICD-10-CM | POA: Insufficient documentation

## 2018-01-18 DIAGNOSIS — F0390 Unspecified dementia without behavioral disturbance: Secondary | ICD-10-CM | POA: Diagnosis not present

## 2018-01-18 DIAGNOSIS — Z7982 Long term (current) use of aspirin: Secondary | ICD-10-CM | POA: Insufficient documentation

## 2018-01-18 DIAGNOSIS — Z043 Encounter for examination and observation following other accident: Secondary | ICD-10-CM | POA: Insufficient documentation

## 2018-01-18 DIAGNOSIS — Z8673 Personal history of transient ischemic attack (TIA), and cerebral infarction without residual deficits: Secondary | ICD-10-CM | POA: Insufficient documentation

## 2018-01-18 DIAGNOSIS — W19XXXA Unspecified fall, initial encounter: Secondary | ICD-10-CM

## 2018-01-18 NOTE — ED Provider Notes (Signed)
[facility]-EMERGENCY DEPT Provider Note   CSN: [id number] Arrival date & time: 01/18/18  1526     History   Chief Complaint Chief Complaint  Patient presents with  . Fall    HPI Jacob York is a 82 y.o. male.  82 year old male who has history of dementia from nursing home who fell out of a wheelchair.  It was unwitnessed.  Some concern that he might have a head injury.  No known trauma was seen.  No active bleeding.  He has no acute complaints at this time.  Patient is currently on his baseline.     Past Medical History:  Diagnosis Date  . Dementia   . Hypertension   . Stroke (HCC)   . Urinary retention 09/22/2014    Patient Active Problem List   Diagnosis Date Noted  . Agitation 12/02/2015  . UTI (lower urinary tract infection) 03/03/2015  . Pre-syncope 10/15/2014  . H/O urinary retention 09/22/2014  . Bigeminy 09/21/2014  . Syncope 09/21/2014  . Bradycardia 09/21/2014  . Chronic renal insufficiency, stage III (moderate) (HCC) 09/21/2014  . Ventral hernia 06/20/2014  . Bilateral inguinal hernia (BIH) L>>R 05/24/2014  . Incarcerated ventral hernia - 4cm supraumbilical 05/24/2014  . Diastasis recti 05/24/2014  . Memory loss 05/24/2014  . Dementia with behavioral disturbance   . Hypertension     Past Surgical History:  Procedure Laterality Date  . INGUINAL HERNIA REPAIR N/A 06/20/2014   Procedure: LAPAROSCOPIC BILATERAL INGUINAL HERNIA REPAIR, LEFT FEMORAL AND INCARCERATED SUPRA UMBILICAL HERNIA REPAIR WITH MESH;  Surgeon: Karie Soda, MD;  Location: [facility];  Service: General;  Laterality: N/A;  . INSERTION OF MESH N/A 06/20/2014   Procedure: INSERTION OF MESH;  Surgeon: Karie Soda, MD;  Location: [facility];  Service: General;  Laterality: N/A;        Home Medications    Prior to Admission medications   Medication Sig Start Date End Date Taking? Authorizing Provider  acetaminophen (TYLENOL) 500 MG tablet Take 500 mg by mouth every  4 (four) hours as needed for mild pain, moderate pain, fever or headache.     [provider]  alum & mag hydroxide-simeth (MINTOX) 200-200-20 MG/5ML suspension Take 30 mLs by mouth as needed for indigestion or heartburn.     [provider]  aspirin 81 MG chewable tablet Chew 81 mg by mouth daily with breakfast.     [provider]  cetirizine (ZYRTEC) 10 MG tablet Take 10 mg by mouth daily as needed (itching).    [provider]  cholecalciferol (VITAMIN D) 1000 UNITS tablet Take 1,000 Units by mouth daily with breakfast.     [provider]  divalproex (DEPAKOTE SPRINKLE) 125 MG capsule Take 250-500 mg by mouth 2 (two) times daily. Takes 250 mg in the morning and 500 mg at night. May mix in applesauce or pudding    [provider]  guaifenesin (ROBITUSSIN) 100 MG/5ML syrup Take 200 mg by mouth every 6 (six) hours as needed for cough.     [provider]  loperamide (IMODIUM) 2 MG capsule Take 2 mg by mouth as needed for diarrhea or loose stools.     [provider]  LORazepam (ATIVAN) 0.5 MG tablet Take 0.5 mg by mouth 3 (three) times daily. Takes at 0800 , 1400 and 2000    [provider]  magnesium hydroxide (MILK OF MAGNESIA) 400 MG/5ML suspension Take 30 mLs by mouth at bedtime as needed for moderate constipation.  Reported on 01/02/2016    [provider]  mirtazapine (REMERON) 30 MG tablet Take 30 mg by mouth at bedtime.    [provider]  NAMENDA XR 14 MG CP24 24 hr capsule Take 14 mg by mouth daily with breakfast.  01/30/16   [provider]  neomycin-bacitracin-polymyxin (NEOSPORIN) [id number] ointment Apply 1 application topically daily as needed (for skin tears, abrasions, or minor irritation).     [provider]  QUEtiapine (SEROQUEL) 25 MG tablet Take 25 mg by mouth 2 (two) times daily.    [provider]  Skin Protectants, Misc. (MINERIN) CREA Apply 1 application  topically daily as needed (dry skin).    [provider]  tamsulosin (FLOMAX) 0.4 MG CAPS capsule Take 1 capsule (0.4 mg total) by mouth 2 (two) times daily. 03/21/16   [name], MD  traZODone (DESYREL) 50 MG tablet Take 50 mg by mouth at bedtime.    [provider]    Family History Family History  Problem Relation Age of Onset  . Stroke Mother   . Hypertension Mother     Social History Social History   Tobacco Use  . Smoking status: Never Smoker  . Smokeless tobacco: Never Used  Substance Use Topics  . Alcohol use: No  . Drug use: No     Allergies   Patient has no known allergies.   Review of Systems Review of Systems  Unable to perform ROS: Dementia     Physical Exam Updated Vital Signs BP (!) 143/81 (BP Location: Left Arm)   Pulse 72   Temp 97.9 F (36.6 C) (Oral)   Resp 16   SpO2 100%   Physical Exam  Constitutional: He appears well-developed and well-nourished.  Non-toxic appearance. No distress.  HENT:  Head: Normocephalic and atraumatic.  Eyes: Pupils are equal, round, and reactive to light. Conjunctivae, EOM and lids are normal.  Neck: Normal range of motion. Neck supple. No tracheal deviation present. No thyroid mass present.  Cardiovascular: Normal rate, regular rhythm and normal heart sounds. Exam reveals no gallop.  No murmur heard. Pulmonary/Chest: Effort normal and breath sounds normal. No stridor. No respiratory distress. He has no decreased breath sounds. He has no wheezes. He has no rhonchi. He has no rales.  Abdominal: Soft. Normal appearance and bowel sounds are normal. He exhibits no distension. There is no tenderness. There is no rebound and no CVA tenderness.  Musculoskeletal: Normal range of motion. He exhibits no edema or tenderness.  Neurological: He is alert. He displays atrophy. No cranial nerve deficit. GCS eye subscore is 4. GCS verbal subscore is 4. GCS motor subscore is 5.  Skin: Skin is warm and dry. No  abrasion and no rash noted.  Psychiatric: He is inattentive.  Nursing note and vitals reviewed.    ED Treatments / Results  Labs (all labs ordered are listed, but only abnormal results are displayed) Labs Reviewed - No data to display  EKG None  Radiology No results found.  Procedures Procedures (including critical care time)  Medications Ordered in ED Medications - No data to display   Initial Impression / Assessment and Plan / ED Course  I have reviewed the triage vital signs and the nursing notes.  Pertinent labs & imaging results that were available during my care of the patient were reviewed by me and considered in my medical decision making (see chart for details).     CT of head and neck without acute findings.  Patient  has no visible signs of trauma.  He is stable for discharge  Final Clinical Impressions(s) / ED Diagnoses   Final diagnoses:  None    ED Discharge Orders    None       Lorre NickAllen, Purcell Jungbluth, MD 01/18/18 1752

## 2018-01-18 NOTE — ED Notes (Signed)
Bed: WA07 Expected date:  Expected time:  Means of arrival:  Comments: Fall, hit head

## 2018-01-18 NOTE — ED Notes (Signed)
[facility] called for transport.  

## 2018-01-18 NOTE — ED Triage Notes (Signed)
[facility] bib EMS [facility]. Pt was last seen in a wheelchair and was then on the floor.  On EMS arrival, pt was back in his wheelchair.  Staff thinks he may have hit his head.  Pt a/o to person which is his baseline.  Pt reports generalized pain that is chronic.  Pt denies new pain.  Pt is not on blood thinners and EMS did not note any obvious injuries.

## 2018-04-19 ENCOUNTER — Encounter (HOSPITAL_COMMUNITY): Payer: Self-pay | Admitting: Emergency Medicine

## 2018-04-19 ENCOUNTER — Emergency Department (HOSPITAL_COMMUNITY)
Admission: EM | Admit: 2018-04-19 | Discharge: 2018-04-19 | Disposition: A | Discharge status: Skilled Nursing Facility | Payer: Medicare Other | Attending: Emergency Medicine | Admitting: Emergency Medicine

## 2018-04-19 ENCOUNTER — Other Ambulatory Visit: Payer: Self-pay

## 2018-04-19 DIAGNOSIS — Y92129 Unspecified place in nursing home as the place of occurrence of the external cause: Secondary | ICD-10-CM | POA: Diagnosis not present

## 2018-04-19 DIAGNOSIS — Z043 Encounter for examination and observation following other accident: Secondary | ICD-10-CM | POA: Diagnosis present

## 2018-04-19 DIAGNOSIS — Y939 Activity, unspecified: Secondary | ICD-10-CM | POA: Insufficient documentation

## 2018-04-19 DIAGNOSIS — Y999 Unspecified external cause status: Secondary | ICD-10-CM | POA: Diagnosis not present

## 2018-04-19 DIAGNOSIS — W1830XA Fall on same level, unspecified, initial encounter: Secondary | ICD-10-CM | POA: Insufficient documentation

## 2018-04-19 DIAGNOSIS — S0990XA Unspecified injury of head, initial encounter: Secondary | ICD-10-CM | POA: Diagnosis not present

## 2018-04-19 DIAGNOSIS — W19XXXA Unspecified fall, initial encounter: Secondary | ICD-10-CM

## 2018-04-19 HISTORY — DX: Dementia in other diseases classified elsewhere, unspecified severity, without behavioral disturbance, psychotic disturbance, mood disturbance, and anxiety: F02.80

## 2018-04-19 HISTORY — DX: Alzheimer's disease, unspecified: G30.9

## 2018-04-19 NOTE — ED Notes (Signed)
Bed: WA20 Expected date:  Expected time:  Means of arrival:  Comments[facility]

## 2018-04-19 NOTE — ED Notes (Signed)
[facility] called for transport.  

## 2018-04-19 NOTE — ED Triage Notes (Signed)
[facility]. Hx Alzheimer's.  Patient's only complaint is a slight headache from hitting his head on the door per staff but patient was not near a door. Patient states he hit his head on the floor. Patient is wheelchair bound per nursing home. Staff heard a crash and patient was found on the ground not near his wheel chair. Patient negative for thinners. No neck or back pain. No LOC.

## 2018-04-19 NOTE — ED Provider Notes (Signed)
[facility]-EMERGENCY DEPT Provider Note: Jacob Dell, MD, FACEP  CSN: [id number] MRN: [id number] ARRIVAL: 04/19/18 at 0314 ROOM: WA20/WA20   CHIEF COMPLAINT  Fall  Level 5 caveat: Dementia HISTORY OF PRESENT ILLNESS  04/19/18 3:23 AM Jacob York is a 82 y.o. male with a history of dementia who was sent from his living facility after an unwitnessed fall.  One report was that he hit his head on a door but another staff member stated the patient was not near a door.  Since he is wheelchair-bound he may have been attempting to ambulate and fell.  Patient states he hit the back of his head but he does not know what he hit it on.  He is complaining of mild throbbing pain.  No hematoma or other obvious injury was noted by staff or EMS.  He is awake and alert to his baseline.   Past Medical History:  Diagnosis Date  . Alzheimer's dementia   . Hypertension   . Stroke (HCC)   . Urinary retention 09/22/2014    Past Surgical History:  Procedure Laterality Date  . INGUINAL HERNIA REPAIR N/A 06/20/2014   Procedure: LAPAROSCOPIC BILATERAL INGUINAL HERNIA REPAIR, LEFT FEMORAL AND INCARCERATED SUPRA UMBILICAL HERNIA REPAIR WITH MESH;  Surgeon: Karie Soda, MD;  Location: [facility];  Service: General;  Laterality: N/A;  . INSERTION OF MESH N/A 06/20/2014   Procedure: INSERTION OF MESH;  Surgeon: Karie Soda, MD;  Location: [facility];  Service: General;  Laterality: N/A;    Family History  Problem Relation Age of Onset  . Stroke Mother   . Hypertension Mother     Social History   Tobacco Use  . Smoking status: Never Smoker  . Smokeless tobacco: Never Used  Substance Use Topics  . Alcohol use: No  . Drug use: No    Prior to Admission medications   Medication Sig Start Date End Date Taking? Authorizing Provider  acetaminophen (TYLENOL) 500 MG tablet Take 500 mg by mouth every 4 (four) hours as needed for mild pain, moderate pain, fever or headache.     [provider]  alum & mag  hydroxide-simeth (MINTOX) 200-200-20 MG/5ML suspension Take 30 mLs by mouth as needed for indigestion or heartburn.     [provider]  aspirin 81 MG chewable tablet Chew 81 mg by mouth daily with breakfast.     [provider]  cetirizine (ZYRTEC) 10 MG tablet Take 10 mg by mouth daily as needed (itching).    [provider]  cholecalciferol (VITAMIN D) 1000 UNITS tablet Take 1,000 Units by mouth daily with breakfast.     [provider]  divalproex (DEPAKOTE SPRINKLE) 125 MG capsule Take 250-500 mg by mouth 2 (two) times daily. Takes 250 mg in the morning and 500 mg at night. May mix in applesauce or pudding    [provider]  guaifenesin (ROBITUSSIN) 100 MG/5ML syrup Take 200 mg by mouth every 6 (six) hours as needed for cough.     [provider]  loperamide (IMODIUM) 2 MG capsule Take 2 mg by mouth as needed for diarrhea or loose stools.     [provider]  LORazepam (ATIVAN) 0.5 MG tablet Take 0.5 mg by mouth 3 (three) times daily. Takes at 0800 , 1400 and 2000    [provider]  magnesium hydroxide (MILK OF MAGNESIA) 400 MG/5ML suspension Take 30 mLs by mouth at bedtime as needed for moderate constipation. Reported on 01/02/2016  [provider]  mirtazapine (REMERON) 30 MG tablet Take 30 mg by mouth at bedtime.    [provider]  NAMENDA XR 14 MG CP24 24 hr capsule Take 14 mg by mouth daily with breakfast.  01/30/16   [provider]  neomycin-bacitracin-polymyxin (NEOSPORIN) [phone] ointment Apply 1 application topically daily as needed (for skin tears, abrasions, or minor irritation).     [provider]  QUEtiapine (SEROQUEL) 25 MG tablet Take 25 mg by mouth 2 (two) times daily.    [provider]  Skin Protectants, Misc. (MINERIN) CREA Apply 1 application topically daily as needed (dry skin).    [provider]  tamsulosin (FLOMAX) 0.4 MG CAPS capsule Take 1  capsule (0.4 mg total) by mouth 2 (two) times daily. 03/21/16   Nelva NayBeaton, Robert, MD  traZODone (DESYREL) 50 MG tablet Take 50 mg by mouth at bedtime.    [provider]    Allergies Patient has no known allergies.   REVIEW OF SYSTEMS  Negative except as noted here or in the History of Present Illness.   PHYSICAL EXAMINATION  Initial Vital Signs Blood pressure 138/78, pulse 76, temperature 97.9 F (36.6 C), resp. rate 16, SpO2 98 %.  Examination General: Well-developed, well-nourished male in no acute distress; appearance consistent with age of record HENT: normocephalic; no scalp hematoma seen or palpated Eyes: pupils equal, round and reactive to light; extraocular muscles grossly intact Neck: supple; nontender Heart: regular rate and rhythm Lungs: clear to auscultation bilaterally Abdomen: soft; nondistended; nontender; bowel sounds present Extremities: No deformity; no tenderness on passive range of motion; pulses normal Neurologic: Awake, alert and oriented x 2; motor function intact in all extremities and symmetric; no facial droop Skin: Warm and dry Psychiatric: Normal mood and affect   RESULTS  Summary of this visit's results, reviewed by myself:   EKG Interpretation  Date/Time:    Ventricular Rate:    PR Interval:    QRS Duration:   QT Interval:    QTC Calculation:   R Axis:     Text Interpretation:        Laboratory Studies: No results found for this or any previous visit (from the past 24 hour(s)). Imaging Studies: No results found.  ED COURSE and MDM  Nursing notes and initial vitals signs, including pulse oximetry, reviewed.  Vitals:   04/19/18 0316 04/19/18 0339  BP:  138/78  Pulse:  76  Resp:  16  Temp:  97.9 F (36.6 C)  SpO2: 98% 98%    PROCEDURES    ED DIAGNOSES     ICD-10-CM   1. Fall at nursing home, initial encounter W19.XXXA    Y92.129   2. Minor head injury, initial encounter S09.90XA        Jacob York, Jacob RuizJohn,  MD 04/19/18 0345

## 2018-04-30 ENCOUNTER — Emergency Department (HOSPITAL_COMMUNITY)
Admission: EM | Admit: 2018-04-30 | Discharge: 2018-04-30 | Disposition: A | Discharge status: Home / Self Care | Payer: Medicare Other | Attending: Emergency Medicine | Admitting: Emergency Medicine

## 2018-04-30 ENCOUNTER — Encounter (HOSPITAL_COMMUNITY): Payer: Self-pay

## 2018-04-30 ENCOUNTER — Other Ambulatory Visit: Payer: Self-pay

## 2018-04-30 DIAGNOSIS — R4689 Other symptoms and signs involving appearance and behavior: Secondary | ICD-10-CM | POA: Insufficient documentation

## 2018-04-30 DIAGNOSIS — G309 Alzheimer's disease, unspecified: Secondary | ICD-10-CM | POA: Insufficient documentation

## 2018-04-30 DIAGNOSIS — I10 Essential (primary) hypertension: Secondary | ICD-10-CM | POA: Insufficient documentation

## 2018-04-30 DIAGNOSIS — Z8673 Personal history of transient ischemic attack (TIA), and cerebral infarction without residual deficits: Secondary | ICD-10-CM | POA: Diagnosis not present

## 2018-04-30 DIAGNOSIS — Z79899 Other long term (current) drug therapy: Secondary | ICD-10-CM | POA: Diagnosis not present

## 2018-04-30 DIAGNOSIS — Z7982 Long term (current) use of aspirin: Secondary | ICD-10-CM | POA: Diagnosis not present

## 2018-04-30 LAB — CBC WITH DIFFERENTIAL/PLATELET
BASOS PCT: 0 %
Basophils Absolute: 0 10*3/uL (ref 0.0–0.1)
EOS ABS: 0.1 10*3/uL (ref 0.0–0.7)
Eosinophils Relative: 1 %
HCT: 47.9 % (ref 39.0–52.0)
Hemoglobin: 16.3 g/dL (ref 13.0–17.0)
Lymphocytes Relative: 9 %
Lymphs Abs: 0.9 10*3/uL (ref 0.7–4.0)
MCH: 30.9 pg (ref 26.0–34.0)
MCHC: 34 g/dL (ref 30.0–36.0)
MCV: 90.9 fL (ref 78.0–100.0)
MONOS PCT: 5 %
Monocytes Absolute: 0.5 10*3/uL (ref 0.1–1.0)
NEUTROS PCT: 85 %
Neutro Abs: 8.1 10*3/uL — ABNORMAL HIGH (ref 1.7–7.7)
PLATELETS: 182 10*3/uL (ref 150–400)
RBC: 5.27 MIL/uL (ref 4.22–5.81)
RDW: 13.8 % (ref 11.5–15.5)
WBC: 9.6 10*3/uL (ref 4.0–10.5)

## 2018-04-30 LAB — BASIC METABOLIC PANEL
Anion gap: 11 (ref 5–15)
BUN: 18 mg/dL (ref 8–23)
CALCIUM: 9.6 mg/dL (ref 8.9–10.3)
CO2: 27 mmol/L (ref 22–32)
CREATININE: 1.3 mg/dL — AB (ref 0.61–1.24)
Chloride: 104 mmol/L (ref 98–111)
GFR calc non Af Amer: 51 mL/min — ABNORMAL LOW (ref >60)
GFR, EST AFRICAN AMERICAN: 59 mL/min — AB (ref >60)
Glucose, Bld: 113 mg/dL — ABNORMAL HIGH (ref 70–99)
Potassium: 3.6 mmol/L (ref 3.5–5.1)
SODIUM: 142 mmol/L (ref 135–145)

## 2018-04-30 LAB — URINALYSIS, ROUTINE W REFLEX MICROSCOPIC
BILIRUBIN URINE: NEGATIVE
Glucose, UA: NEGATIVE mg/dL
Hgb urine dipstick: NEGATIVE
KETONES UR: NEGATIVE mg/dL
Leukocytes, UA: NEGATIVE
NITRITE: NEGATIVE
PH: 9 — AB (ref 5.0–8.0)
Protein, ur: NEGATIVE mg/dL
Specific Gravity, Urine: 1.01 (ref 1.005–1.030)

## 2018-04-30 MED ORDER — LORAZEPAM 0.5 MG PO TABS: 0.5000 mg | ORAL_TABLET | Freq: Four times a day (QID) | ORAL | Status: DC | PRN

## 2018-04-30 MED ORDER — LORAZEPAM 0.5 MG PO TABS: ORAL_TABLET | ORAL | 0 refills | Status: DC

## 2018-04-30 NOTE — ED Notes (Signed)
[facility]
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
## 2018-04-30 NOTE — Discharge Instructions (Addendum)
For follow-up with your doctor if needed

## 2018-04-30 NOTE — ED Notes (Signed)
Tried to call facility x3 times. Phone will not ring. Unable to give report. Looked multiple places and all the numbers were the same. Would not go through.

## 2018-04-30 NOTE — ED Notes (Signed)
Pt continues to require redirection back to bed and room

## 2018-05-01 NOTE — ED Provider Notes (Signed)
[facility]-EMERGENCY DEPT Provider Note   CSN: [id number] Arrival date & time: 04/30/18  1117     History   Chief Complaint Chief Complaint  Patient presents with  . Aggressive Behavior    HPI Jacob York is a 82 y.o. male.  Patient is brought to the emergency department because he is been combative.  Patient has a history of severe dementia.  The history is provided by the nursing home. No language interpreter was used.  Altered Mental Status   This is a recurrent problem. The current episode started 3 to 5 hours ago. The problem has been resolved. Associated symptoms include confusion. Risk factors: Dementia. His past medical history does not include seizures.    Past Medical History:  Diagnosis Date  . Alzheimer's dementia   . Hypertension   . Stroke (HCC)   . Urinary retention 09/22/2014    Patient Active Problem List   Diagnosis Date Noted  . Agitation 12/02/2015  . UTI (lower urinary tract infection) 03/03/2015  . Pre-syncope 10/15/2014  . H/O urinary retention 09/22/2014  . Bigeminy 09/21/2014  . Syncope 09/21/2014  . Bradycardia 09/21/2014  . Chronic renal insufficiency, stage III (moderate) (HCC) 09/21/2014  . Ventral hernia 06/20/2014  . Bilateral inguinal hernia (BIH) L>>R 05/24/2014  . Incarcerated ventral hernia - 4cm supraumbilical 05/24/2014  . Diastasis recti 05/24/2014  . Memory loss 05/24/2014  . Dementia with behavioral disturbance   . Hypertension     Past Surgical History:  Procedure Laterality Date  . INGUINAL HERNIA REPAIR N/A 06/20/2014   Procedure: LAPAROSCOPIC BILATERAL INGUINAL HERNIA REPAIR, LEFT FEMORAL AND INCARCERATED SUPRA UMBILICAL HERNIA REPAIR WITH MESH;  Surgeon: Karie SodaSteven Gross, MD;  Location: [facility];  Service: General;  Laterality: N/A;  . INSERTION OF MESH N/A 06/20/2014   Procedure: INSERTION OF MESH;  Surgeon: Karie SodaSteven Gross, MD;  Location: [facility];  Service: General;  Laterality: N/A;         Home Medications    Prior to Admission medications   Medication Sig Start Date End Date Taking? Authorizing Provider  aspirin 81 MG chewable tablet Chew 81 mg by mouth daily with breakfast.    Yes [provider]  cholecalciferol (VITAMIN D) 1000 UNITS tablet Take 1,000 Units by mouth daily with breakfast.    Yes [provider]  divalproex (DEPAKOTE SPRINKLE) 125 MG capsule Take 250-500 mg by mouth 2 (two) times daily. Takes 250 mg in the morning and 500 mg at night. May mix in applesauce or pudding   Yes [provider]  mirtazapine (REMERON) 30 MG tablet Take 30 mg by mouth at bedtime.   Yes [provider]  NAMENDA XR 14 MG CP24 24 hr capsule Take 14 mg by mouth daily with breakfast.  01/30/16  Yes [provider]  QUEtiapine (SEROQUEL) 25 MG tablet Take 75 mg by mouth at bedtime.    Yes [provider]  QUEtiapine (SEROQUEL) 50 MG tablet Take 50 mg by mouth daily.  04/07/18  Yes [provider]  tamsulosin (FLOMAX) 0.4 MG CAPS capsule Take 1 capsule (0.4 mg total) by mouth 2 (two) times daily. 03/21/16  Yes Nelva NayBeaton, Robert, MD  traZODone (DESYREL) 50 MG tablet Take 50 mg by mouth at bedtime.   Yes [provider]  acetaminophen (TYLENOL) 500 MG tablet Take 500 mg by mouth every 4 (four) hours as needed for mild pain, moderate pain, fever or headache.     [provider]  alum &  mag hydroxide-simeth (MINTOX) 200-200-20 MG/5ML suspension Take 30 mLs by mouth as needed for indigestion or heartburn.     [provider]  cetirizine (ZYRTEC) 10 MG tablet Take 10 mg by mouth daily as needed (itching).    [provider]  guaifenesin (ROBITUSSIN) 100 MG/5ML syrup Take 200 mg by mouth every 6 (six) hours as needed for cough.     [provider]  loperamide (IMODIUM) 2 MG capsule Take 2 mg by mouth as needed for diarrhea or loose stools.     [provider]  LORazepam (ATIVAN) 0.5  MG tablet Take 1 every 8 hours if needed for anxiety or aggressive behavior 04/30/18   Jacob Berkshire, MD  magnesium hydroxide (MILK OF MAGNESIA) 400 MG/5ML suspension Take 30 mLs by mouth at bedtime as needed for moderate constipation. Reported on 01/02/2016    [provider]  neomycin-bacitracin-polymyxin (NEOSPORIN) [phone] ointment Apply 1 application topically daily as needed (for skin tears, abrasions, or minor irritation).     [provider]  Skin Protectants, Misc. (MINERIN) CREA Apply 1 application topically daily as needed (dry skin).    [provider]    Family History Family History  Problem Relation Age of Onset  . Stroke Mother   . Hypertension Mother     Social History Social History   Tobacco Use  . Smoking status: Never Smoker  . Smokeless tobacco: Never Used  Substance Use Topics  . Alcohol use: No  . Drug use: No     Allergies   Patient has no known allergies.   Review of Systems Review of Systems  Unable to perform ROS: Dementia  Psychiatric/Behavioral: Positive for confusion.     Physical Exam Updated Vital Signs BP 124/78 (BP Location: Left Arm)   Pulse 87   Temp 98.4 F (36.9 C) (Oral)   Resp 17   SpO2 97%   Physical Exam  Constitutional: He appears well-developed.  HENT:  Head: Normocephalic.  Eyes: Conjunctivae and EOM are normal. No scleral icterus.  Neck: Neck supple. No thyromegaly present.  Cardiovascular: Normal rate and regular rhythm. Exam reveals no gallop and no friction rub.  No murmur heard. Pulmonary/Chest: No stridor. He has no wheezes. He has no rales. He exhibits no tenderness.  Abdominal: He exhibits no distension. There is no tenderness. There is no rebound.  Musculoskeletal: Normal range of motion. He exhibits no edema.  Lymphadenopathy:    He has no cervical adenopathy.  Neurological: He is alert. He exhibits normal muscle tone. Coordination normal.  Patient alert but only oriented to  person  Skin: No rash noted. No erythema.     ED Treatments / Results  Labs (all labs ordered are listed, but only abnormal results are displayed) Labs Reviewed  CBC WITH DIFFERENTIAL/PLATELET - Abnormal; Notable for the following components:      Result Value   Neutro Abs 8.1 (*)    All other components within normal limits  BASIC METABOLIC PANEL - Abnormal; Notable for the following components:   Glucose, Bld 113 (*)    Creatinine, Ser 1.30 (*)    GFR calc non Af Amer 51 (*)    GFR calc Af Amer 59 (*)    All other components within normal limits  URINALYSIS, ROUTINE W REFLEX MICROSCOPIC - Abnormal; Notable for the following components:   pH 9.0 (*)    All other components within normal limits    EKG None  Radiology No results found.  Procedures Procedures (including  critical care time)  Medications Ordered in ED Medications - No data to display   Initial Impression / Assessment and Plan / ED Course  I have reviewed the triage vital signs and the nursing notes.  Pertinent labs & imaging results that were available during my care of the patient were reviewed by me and considered in my medical decision making (see chart for details).     Patient's basic labs are unremarkable.  He was not combative in the emergency department.  He will follow-up with his primary care  Final Clinical Impressions(s) / ED Diagnoses   Final diagnoses:  Aggressive behavior    ED Discharge Orders        Ordered    LORazepam (ATIVAN) 0.5 MG tablet     04/30/18 1616       Jacob Berkshire, MD 05/01/18 [phone]

## 2018-12-24 ENCOUNTER — Emergency Department (HOSPITAL_COMMUNITY)
Admission: EM | Admit: 2018-12-24 | Discharge: 2018-12-24 | Disposition: A | Discharge status: Home / Self Care | Payer: Medicare Other | Attending: Emergency Medicine | Admitting: Emergency Medicine

## 2018-12-24 ENCOUNTER — Emergency Department (HOSPITAL_COMMUNITY): Payer: Medicare Other

## 2018-12-24 ENCOUNTER — Encounter (HOSPITAL_COMMUNITY): Payer: Self-pay | Admitting: *Deleted

## 2018-12-24 DIAGNOSIS — Z79899 Other long term (current) drug therapy: Secondary | ICD-10-CM | POA: Insufficient documentation

## 2018-12-24 DIAGNOSIS — Z8673 Personal history of transient ischemic attack (TIA), and cerebral infarction without residual deficits: Secondary | ICD-10-CM | POA: Insufficient documentation

## 2018-12-24 DIAGNOSIS — Y92122 Bedroom in nursing home as the place of occurrence of the external cause: Secondary | ICD-10-CM | POA: Diagnosis not present

## 2018-12-24 DIAGNOSIS — R51 Headache: Secondary | ICD-10-CM | POA: Insufficient documentation

## 2018-12-24 DIAGNOSIS — Z7982 Long term (current) use of aspirin: Secondary | ICD-10-CM | POA: Insufficient documentation

## 2018-12-24 DIAGNOSIS — F0391 Unspecified dementia with behavioral disturbance: Secondary | ICD-10-CM | POA: Insufficient documentation

## 2018-12-24 DIAGNOSIS — G309 Alzheimer's disease, unspecified: Secondary | ICD-10-CM | POA: Insufficient documentation

## 2018-12-24 DIAGNOSIS — W1830XA Fall on same level, unspecified, initial encounter: Secondary | ICD-10-CM | POA: Diagnosis not present

## 2018-12-24 DIAGNOSIS — Y999 Unspecified external cause status: Secondary | ICD-10-CM | POA: Diagnosis not present

## 2018-12-24 DIAGNOSIS — Y939 Activity, unspecified: Secondary | ICD-10-CM | POA: Diagnosis not present

## 2018-12-24 DIAGNOSIS — I10 Essential (primary) hypertension: Secondary | ICD-10-CM | POA: Diagnosis not present

## 2018-12-24 DIAGNOSIS — W19XXXA Unspecified fall, initial encounter: Secondary | ICD-10-CM

## 2018-12-24 NOTE — ED Triage Notes (Signed)
Per EMS, pt had unwitnessed fall at [facility]. Pt was on the floor beside his bed when staff found him. Pt complained of pain when the right side of his head was palpated. Pt was placed in c-collar. Pt has hx of dementia, he is at baseline mental status per staff, is alert to self.   BP 188/110 HR 78 RR 20 SpO2 99% on RA\ CBG 108

## 2018-12-24 NOTE — ED Notes (Signed)
Pt ambulated in hallway with 1 person assist. Pt did not appear to be in pain while ambulating.

## 2018-12-24 NOTE — ED Notes (Signed)
Report called to [facility], PTAR called.

## 2018-12-24 NOTE — Discharge Instructions (Addendum)
We have found no evidence of severe injury.

## 2018-12-24 NOTE — ED Provider Notes (Signed)
Tenderness.  Discharge home. [facility]-EMERGENCY DEPT Provider Note   CSN: [id number] Arrival date & time: 12/24/18  8841    History   Chief Complaint Chief Complaint  Patient presents with   Fall    HPI Jacob York is a 83 y.o. male.    Level 5 caveat due to dementia. HPI Patient has history of dementia.  Reportedly found on the floor next to his bed at [facility].  Had reportedly been complaining of right-sided head pain but for nursing complaint and nothing in for me complaints that hurts everywhere with palpation.  Not on anticoagulation. Past Medical History:  Diagnosis Date   Alzheimer's dementia (HCC)    Hypertension    Stroke [facility])    Urinary retention 09/22/2014    Patient Active Problem List   Diagnosis Date Noted   Agitation 12/02/2015   UTI (lower urinary tract infection) 03/03/2015   Pre-syncope 10/15/2014   H/O urinary retention 09/22/2014   Bigeminy 09/21/2014   Syncope 09/21/2014   Bradycardia 09/21/2014   Chronic renal insufficiency, stage III (moderate) (HCC) 09/21/2014   Ventral hernia 06/20/2014   Bilateral inguinal hernia (BIH) L>>R 05/24/2014   Incarcerated ventral hernia - 4cm supraumbilical 05/24/2014   Diastasis recti 05/24/2014   Memory loss 05/24/2014   Dementia with behavioral disturbance (HCC)    Hypertension     Past Surgical History:  Procedure Laterality Date   INGUINAL HERNIA REPAIR N/A 06/20/2014   Procedure: LAPAROSCOPIC BILATERAL INGUINAL HERNIA REPAIR, LEFT FEMORAL AND INCARCERATED SUPRA UMBILICAL HERNIA REPAIR WITH MESH;  Surgeon: Karie Soda, MD;  Location: [facility] ORS;  Service: General;  Laterality: N/A;   INSERTION OF MESH N/A 06/20/2014   Procedure: INSERTION OF MESH;  Surgeon: Karie Soda, MD;  Location: [facility]S;  Service: General;  Laterality: N/A;        Home Medications    Prior to Admission medications   Medication Sig Start Date End Date Taking? Authorizing  Provider  acetaminophen (TYLENOL) 500 MG tablet Take 500 mg by mouth every 4 (four) hours as needed for mild pain, moderate pain, fever or headache.     [provider]  alum & mag hydroxide-simeth (MINTOX) 200-200-20 MG/5ML suspension Take 30 mLs by mouth as needed for indigestion or heartburn.     [provider]  aspirin 81 MG chewable tablet Chew 81 mg by mouth daily with breakfast.     [provider]  cetirizine (ZYRTEC) 10 MG tablet Take 10 mg by mouth daily as needed (itching).    [provider]  cholecalciferol (VITAMIN D) 1000 UNITS tablet Take 1,000 Units by mouth daily with breakfast.     [provider]  divalproex (DEPAKOTE SPRINKLE) 125 MG capsule Take 250-500 mg by mouth 2 (two) times daily. Takes 250 mg in the morning and 500 mg at night. May mix in applesauce or pudding    [provider]  guaifenesin (ROBITUSSIN) 100 MG/5ML syrup Take 200 mg by mouth every 6 (six) hours as needed for cough.     [provider]  loperamide (IMODIUM) 2 MG capsule Take 2 mg by mouth as needed for diarrhea or loose stools.     [provider]  LORazepam (ATIVAN) 0.5 MG tablet Take 1 every 8 hours if needed for anxiety or aggressive behavior 04/30/18   [name], MD  magnesium hydroxide (MILK OF MAGNESIA) 400 MG/5ML suspension Take 30 mLs by mouth at bedtime as needed for moderate constipation. Reported on 01/02/2016  [provider]  mirtazapine (REMERON) 30 MG tablet Take 30 mg by mouth at bedtime.    [provider]  NAMENDA XR 14 MG CP24 24 hr capsule Take 14 mg by mouth daily with breakfast.  01/30/16   [provider]  neomycin-bacitracin-polymyxin (NEOSPORIN) [phone] ointment Apply 1 application topically daily as needed (for skin tears, abrasions, or minor irritation).     [provider]  QUEtiapine (SEROQUEL) 25 MG tablet Take 75 mg by mouth at bedtime.     [provider]   QUEtiapine (SEROQUEL) 50 MG tablet Take 50 mg by mouth daily.  04/07/18   [provider]  Skin Protectants, Misc. (MINERIN) CREA Apply 1 application topically daily as needed (dry skin).    [provider]  tamsulosin (FLOMAX) 0.4 MG CAPS capsule Take 1 capsule (0.4 mg total) by mouth 2 (two) times daily. 03/21/16   [name], MD  traZODone (DESYREL) 50 MG tablet Take 50 mg by mouth at bedtime.    [provider]    Family History Family History  Problem Relation Age of Onset   Stroke Mother    Hypertension Mother     Social History Social History   Tobacco Use   Smoking status: Never Smoker   Smokeless tobacco: Never Used  Substance Use Topics   Alcohol use: No   Drug use: No     Allergies   Patient has no known allergies.   Review of Systems Review of Systems  Unable to perform ROS: Dementia     Physical Exam Updated Vital Signs BP (!) 136/98 (BP Location: Right Arm)    Pulse 82    Temp 98 F (36.7 C) (Oral)    Resp 16    SpO2 98%   Physical Exam Vitals signs and nursing note reviewed.  HENT:     Head: Atraumatic.     Nose: Nose normal.     Mouth/Throat:     Mouth: Mucous membranes are moist.  Eyes:     Extraocular Movements: Extraocular movements intact.  Neck:     Musculoskeletal: Neck supple.     Comments: No deformity.  No step-off.  Patient states it hurts however with palpation everywhere on his body. Cardiovascular:     Rate and Rhythm: Regular rhythm.  Pulmonary:     Breath sounds: Normal breath sounds.  Abdominal:     Palpations: There is no mass.     Hernia: No hernia is present.  Musculoskeletal:        General: Tenderness present.     Comments: Tenderness over entire body.  Skin:    Capillary Refill: Capillary refill takes less than 2 seconds.  Neurological:     Mental Status: He is alert.     Comments: Patient with dementia.  Reportedly at baseline.      ED Treatments / Results  Labs (all  labs ordered are listed, but only abnormal results are displayed) Labs Reviewed - No data to display  EKG None  Radiology Ct Head Wo Contrast  Result Date: 12/24/2018 CLINICAL DATA:  Unwitnessed fall. Found on the floor. Right-sided head pain. Dementia patient. EXAM: CT HEAD WITHOUT CONTRAST CT CERVICAL SPINE WITHOUT CONTRAST TECHNIQUE: Multidetector CT imaging of the head and cervical spine was performed following the standard protocol without intravenous contrast. Multiplanar CT image reconstructions of the cervical spine were also generated. COMPARISON:  01/18/2018 FINDINGS: CT HEAD FINDINGS Brain: Chronic generalized atrophy. No focal abnormality affects the brainstem or cerebellum. Cerebral  hemispheres show an old right frontal cortical and subcortical infarction. No mass lesion, hemorrhage, hydrocephalus or extra-axial collection. Vascular: There is atherosclerotic calcification of the major vessels at the base of the brain. Skull: No skull fracture. Chronic right frontal bone thinning, presumably due to distant trauma or surgery. Sinuses/Orbits: Clear/normal Other: None CT CERVICAL SPINE FINDINGS Alignment: No traumatic malalignment. 2 mm degenerative anterolisthesis C5-6. Skull base and vertebrae: The study suffers from motion degradation, but there is no evidence acute traumatic injury. Soft tissues and spinal canal: Negative Disc levels: Chronic osteoarthritis of the C1-2 articulation. Chronic facet arthropathy from C2-3 through C5-6 and at C7-T1. Chronic degenerative spondylosis at C3-4, C4-5 and C5-6. Chronic fusion at C6-7. Upper chest: Negative Other: None IMPRESSION: Head CT: No acute or traumatic finding. Brain atrophy. Old right frontal encephalomalacia. Chronic thinning of the right frontal calvarium, probably due to old trauma or less likely surgery. Cervical spine CT: Chronic degenerative changes as above. No acute or traumatic finding. Electronically Signed   By: Paulina Fusi M.D.   On:  12/24/2018 08:17   Ct Cervical Spine Wo Contrast  Result Date: 12/24/2018 CLINICAL DATA:  Unwitnessed fall. Found on the floor. Right-sided head pain. Dementia patient. EXAM: CT HEAD WITHOUT CONTRAST CT CERVICAL SPINE WITHOUT CONTRAST TECHNIQUE: Multidetector CT imaging of the head and cervical spine was performed following the standard protocol without intravenous contrast. Multiplanar CT image reconstructions of the cervical spine were also generated. COMPARISON:  01/18/2018 FINDINGS: CT HEAD FINDINGS Brain: Chronic generalized atrophy. No focal abnormality affects the brainstem or cerebellum. Cerebral hemispheres show an old right frontal cortical and subcortical infarction. No mass lesion, hemorrhage, hydrocephalus or extra-axial collection. Vascular: There is atherosclerotic calcification of the major vessels at the base of the brain. Skull: No skull fracture. Chronic right frontal bone thinning, presumably due to distant trauma or surgery. Sinuses/Orbits: Clear/normal Other: None CT CERVICAL SPINE FINDINGS Alignment: No traumatic malalignment. 2 mm degenerative anterolisthesis C5-6. Skull base and vertebrae: The study suffers from motion degradation, but there is no evidence acute traumatic injury. Soft tissues and spinal canal: Negative Disc levels: Chronic osteoarthritis of the C1-2 articulation. Chronic facet arthropathy from C2-3 through C5-6 and at C7-T1. Chronic degenerative spondylosis at C3-4, C4-5 and C5-6. Chronic fusion at C6-7. Upper chest: Negative Other: None IMPRESSION: Head CT: No acute or traumatic finding. Brain atrophy. Old right frontal encephalomalacia. Chronic thinning of the right frontal calvarium, probably due to old trauma or less likely surgery. Cervical spine CT: Chronic degenerative changes as above. No acute or traumatic finding. Electronically Signed   By: Paulina Fusi M.D.   On: 12/24/2018 08:17    Procedures Procedures (including critical care time)  Medications  Ordered in ED Medications - No data to display   Initial Impression / Assessment and Plan / ED Course  I have reviewed the triage vital signs and the nursing notes.  Pertinent labs & imaging results that were available during my care of the patient were reviewed by me and considered in my medical decision making (see chart for details).        Patient with fall.  Imaging reassuring.  Doubt severe injury.  Able to ambulate without tenderness.  Final Clinical Impressions(s) / ED Diagnoses   Final diagnoses:  Fall, initial encounter    ED Discharge Orders    None       Benjiman Core, MD 12/24/18 [phone]

## 2018-12-24 NOTE — ED Notes (Signed)
Bed: [facility] Expected date:  Expected time:  Means of arrival:  Comments: EMS 82yo fall dementia

## 2019-05-03 ENCOUNTER — Emergency Department (HOSPITAL_COMMUNITY)
Admission: EM | Admit: 2019-05-03 | Discharge: 2019-05-04 | Disposition: A | Discharge status: Home / Self Care | Payer: Medicare Other | Attending: Emergency Medicine | Admitting: Emergency Medicine

## 2019-05-03 ENCOUNTER — Other Ambulatory Visit: Payer: Self-pay

## 2019-05-03 ENCOUNTER — Encounter (HOSPITAL_COMMUNITY): Payer: Self-pay | Admitting: Obstetrics and Gynecology

## 2019-05-03 ENCOUNTER — Emergency Department (HOSPITAL_COMMUNITY): Payer: Medicare Other

## 2019-05-03 DIAGNOSIS — S0181XA Laceration without foreign body of other part of head, initial encounter: Secondary | ICD-10-CM | POA: Diagnosis present

## 2019-05-03 DIAGNOSIS — G309 Alzheimer's disease, unspecified: Secondary | ICD-10-CM | POA: Diagnosis not present

## 2019-05-03 DIAGNOSIS — Y92129 Unspecified place in nursing home as the place of occurrence of the external cause: Secondary | ICD-10-CM | POA: Diagnosis not present

## 2019-05-03 DIAGNOSIS — Z8673 Personal history of transient ischemic attack (TIA), and cerebral infarction without residual deficits: Secondary | ICD-10-CM | POA: Insufficient documentation

## 2019-05-03 DIAGNOSIS — Y939 Activity, unspecified: Secondary | ICD-10-CM | POA: Diagnosis not present

## 2019-05-03 DIAGNOSIS — Z79899 Other long term (current) drug therapy: Secondary | ICD-10-CM | POA: Diagnosis not present

## 2019-05-03 DIAGNOSIS — F0280 Dementia in other diseases classified elsewhere without behavioral disturbance: Secondary | ICD-10-CM | POA: Insufficient documentation

## 2019-05-03 DIAGNOSIS — Z7982 Long term (current) use of aspirin: Secondary | ICD-10-CM | POA: Diagnosis not present

## 2019-05-03 DIAGNOSIS — W07XXXA Fall from chair, initial encounter: Secondary | ICD-10-CM | POA: Insufficient documentation

## 2019-05-03 DIAGNOSIS — I10 Essential (primary) hypertension: Secondary | ICD-10-CM | POA: Insufficient documentation

## 2019-05-03 DIAGNOSIS — W19XXXA Unspecified fall, initial encounter: Secondary | ICD-10-CM

## 2019-05-03 DIAGNOSIS — Y999 Unspecified external cause status: Secondary | ICD-10-CM | POA: Diagnosis not present

## 2019-05-03 HISTORY — DX: Transient cerebral ischemic attack, unspecified: G45.9

## 2019-05-03 NOTE — ED Notes (Signed)
[facility] contacted and paperwork printed  

## 2019-05-03 NOTE — ED Triage Notes (Signed)
Pt reportedly has dementia. Pt reportedly fell and hit his head with staff present. Pt was reportedly transitioning from the shower to the chair and slipped and hit his head.  Pt denies and LOC and is reportedly not on blood thinners.  VSS.

## 2019-05-03 NOTE — ED Provider Notes (Signed)
[facility] Provider Note   CSN: [id number] Arrival date & time: 05/03/19  1651    History   Chief Complaint Chief Complaint  Patient presents with   Head Laceration   Fall    HPI Jacob York is a 83 y.o. male.    Level 5 caveat due to dementia. HPI Patient with reported fall.  From nursing home.  Baseline dementia.  Small laceration on forehead.  Had fall from the chair.  No real complaints but has dementia.  Not on anticoagulation.  Pediatric cervical collar in place from EMS or nursing home. Past Medical History:  Diagnosis Date   Alzheimer's dementia (HCC)    Hypertension    Stroke [facility](HCC)    TIA (transient ischemic attack)    Urinary retention 09/22/2014    Patient Active Problem List   Diagnosis Date Noted   Agitation 12/02/2015   UTI (lower urinary tract infection) 03/03/2015   Pre-syncope 10/15/2014   H/O urinary retention 09/22/2014   Bigeminy 09/21/2014   Syncope 09/21/2014   Bradycardia 09/21/2014   Chronic renal insufficiency, stage III (moderate) (HCC) 09/21/2014   Ventral hernia 06/20/2014   Bilateral inguinal hernia (BIH) L>>R 05/24/2014   Incarcerated ventral hernia - 4cm supraumbilical 05/24/2014   Diastasis recti 05/24/2014   Memory loss 05/24/2014   Dementia with behavioral disturbance (HCC)    Hypertension     Past Surgical History:  Procedure Laterality Date   INGUINAL HERNIA REPAIR N/A 06/20/2014   Procedure: LAPAROSCOPIC BILATERAL INGUINAL HERNIA REPAIR, LEFT FEMORAL AND INCARCERATED SUPRA UMBILICAL HERNIA REPAIR WITH MESH;  Surgeon: Karie SodaSteven Gross, MD;  Location: [facility]S;  Service: General;  Laterality: N/A;   INSERTION OF MESH N/A 06/20/2014   Procedure: INSERTION OF MESH;  Surgeon: Karie SodaSteven Gross, MD;  Location: [facility];  Service: General;  Laterality: N/A;        Home Medications    Prior to Admission medications   Medication Sig Start Date End Date Taking? Authorizing  Provider  acetaminophen (TYLENOL) 500 MG tablet Take 500 mg by mouth every 4 (four) hours as needed for mild pain, moderate pain, fever or headache.     [provider]  alum & mag hydroxide-simeth (MINTOX) 200-200-20 MG/5ML suspension Take 30 mLs by mouth as needed for indigestion or heartburn.     [provider]  aspirin 81 MG chewable tablet Chew 81 mg by mouth daily with breakfast.     [provider]  cetirizine (ZYRTEC) 10 MG tablet Take 10 mg by mouth daily as needed (itching).    [provider]  cholecalciferol (VITAMIN D) 1000 UNITS tablet Take 1,000 Units by mouth daily with breakfast.     [provider]  divalproex (DEPAKOTE SPRINKLE) 125 MG capsule Take 250-500 mg by mouth 2 (two) times daily. Takes 250 mg in the morning and 500 mg at night. May mix in applesauce or pudding    [provider]  guaifenesin (ROBITUSSIN) 100 MG/5ML syrup Take 200 mg by mouth every 6 (six) hours as needed for cough.     [provider]  loperamide (IMODIUM) 2 MG capsule Take 2 mg by mouth as needed for diarrhea or loose stools.     [provider]  LORazepam (ATIVAN) 0.5 MG tablet Take 1 every 8 hours if needed for anxiety or aggressive behavior 04/30/18   Bethann BerkshireZammit, Joseph, MD  magnesium hydroxide (MILK OF MAGNESIA) 400 MG/5ML suspension Take 30 mLs by mouth at bedtime as needed for  moderate constipation. Reported on 01/02/2016    [provider]  mirtazapine (REMERON) 30 MG tablet Take 30 mg by mouth at bedtime.    [provider]  NAMENDA XR 14 MG CP24 24 hr capsule Take 14 mg by mouth daily with breakfast.  01/30/16   [provider]  neomycin-bacitracin-polymyxin (NEOSPORIN) [phone] ointment Apply 1 application topically daily as needed (for skin tears, abrasions, or minor irritation).     [provider]  QUEtiapine (SEROQUEL) 25 MG tablet Take 75 mg by mouth at bedtime.     [provider]   QUEtiapine (SEROQUEL) 50 MG tablet Take 50 mg by mouth daily.  04/07/18   [provider]  Skin Protectants, Misc. (MINERIN) CREA Apply 1 application topically daily as needed (dry skin).    [provider]  tamsulosin (FLOMAX) 0.4 MG CAPS capsule Take 1 capsule (0.4 mg total) by mouth 2 (two) times daily. 03/21/16   Nelva NayBeaton, Robert, MD  traZODone (DESYREL) 50 MG tablet Take 50 mg by mouth at bedtime.    [provider]    Family History Family History  Problem Relation Age of Onset   Stroke Mother    Hypertension Mother     Social History Social History   Tobacco Use   Smoking status: Never Smoker   Smokeless tobacco: Never Used  Substance Use Topics   Alcohol use: No   Drug use: No     Allergies   Patient has no known allergies.   Review of Systems Review of Systems  Unable to perform ROS: Dementia     Physical Exam Updated Vital Signs BP 115/78 (BP Location: Left Arm)    Pulse 90    Temp 97.8 F (36.6 C) (Oral)    Resp 15    SpO2 99%   Physical Exam Vitals signs and nursing note reviewed.  Constitutional:      Appearance: Normal appearance.  HENT:     Head:     Comments: Under 1 cm laceration on forehead.  Does not go all the way through the skin. Eyes:     Pupils: Pupils are equal, round, and reactive to light.  Neck:     Comments: Mild paraspinal tenderness. Cardiovascular:     Rate and Rhythm: Normal rate.  Pulmonary:     Effort: Pulmonary effort is normal.     Breath sounds: No wheezing, rhonchi or rales.  Abdominal:     Tenderness: There is no abdominal tenderness.  Musculoskeletal:        General: No tenderness or deformity.  Skin:    General: Skin is warm.  Neurological:     Mental Status: Mental status is at baseline.      ED Treatments / Results  Labs (all labs ordered are listed, but only abnormal results are displayed) Labs Reviewed - No data to display  EKG None  Radiology Ct Head Wo  Contrast  Result Date: 05/03/2019 CLINICAL DATA:  Fall.  Head injury.  Dementia. EXAM: CT HEAD WITHOUT CONTRAST CT CERVICAL SPINE WITHOUT CONTRAST TECHNIQUE: Multidetector CT imaging of the head and cervical spine was performed following the standard protocol without intravenous contrast. Multiplanar CT image reconstructions of the cervical spine were also generated. COMPARISON:  CT head 01/18/2018 FINDINGS: CT HEAD FINDINGS Brain: Negative moderate to severe atrophy. For hydrocephalus. Chronic encephalomalacia right frontal lobe with apparent prior surgery. Chronic microvascular ischemia in the white matter. Negative for acute infarct, hemorrhage, mass. No fluid collection or midline shift. Vascular:  Atherosclerotic calcification. Negative for hyperdense vessel Skull: Thinning of the right frontal bone presumably from prior surgery. No skull fracture Sinuses/Orbits: Negative Other: None CT CERVICAL SPINE FINDINGS Alignment: 3 mm anterolisthesis C4-5.  Mild anterolisthesis C5-6. Skull base and vertebrae: Negative for fracture Soft tissues and spinal canal: Atherosclerotic calcification. Negative for soft tissue mass Disc levels: Extensive facet degeneration diffusely. Multilevel disc degeneration in the cervical spine. Upper chest: Negative Other: None IMPRESSION: 1. No acute intracranial abnormality.  Atrophy and chronic ischemia 2. Negative for cervical spine fracture. Multilevel degenerative change throughout the cervical spine. Electronically Signed   By: Franchot Gallo M.D.   On: 05/03/2019 19:49   Ct Cervical Spine Wo Contrast  Result Date: 05/03/2019 CLINICAL DATA:  Fall.  Head injury.  Dementia. EXAM: CT HEAD WITHOUT CONTRAST CT CERVICAL SPINE WITHOUT CONTRAST TECHNIQUE: Multidetector CT imaging of the head and cervical spine was performed following the standard protocol without intravenous contrast. Multiplanar CT image reconstructions of the cervical spine were also generated. COMPARISON:  CT head  01/18/2018 FINDINGS: CT HEAD FINDINGS Brain: Negative moderate to severe atrophy. For hydrocephalus. Chronic encephalomalacia right frontal lobe with apparent prior surgery. Chronic microvascular ischemia in the white matter. Negative for acute infarct, hemorrhage, mass. No fluid collection or midline shift. Vascular: Atherosclerotic calcification. Negative for hyperdense vessel Skull: Thinning of the right frontal bone presumably from prior surgery. No skull fracture Sinuses/Orbits: Negative Other: None CT CERVICAL SPINE FINDINGS Alignment: 3 mm anterolisthesis C4-5.  Mild anterolisthesis C5-6. Skull base and vertebrae: Negative for fracture Soft tissues and spinal canal: Atherosclerotic calcification. Negative for soft tissue mass Disc levels: Extensive facet degeneration diffusely. Multilevel disc degeneration in the cervical spine. Upper chest: Negative Other: None IMPRESSION: 1. No acute intracranial abnormality.  Atrophy and chronic ischemia 2. Negative for cervical spine fracture. Multilevel degenerative change throughout the cervical spine. Electronically Signed   By: Franchot Gallo M.D.   On: 05/03/2019 19:49    Procedures Procedures (including critical care time)  Medications Ordered in ED Medications - No data to display   Initial Impression / Assessment and Plan / ED Course  I have reviewed the triage vital signs and the nursing notes.  Pertinent labs & imaging results that were available during my care of the patient were reviewed by me and considered in my medical decision making (see chart for details).        Patient with fall.  Recurrent.  Demented.  Imaging reassuring.  Laceration small does not need repair.  Discharge back to nursing home. no other apparent injury.  Final Clinical Impressions(s) / ED Diagnoses   Final diagnoses:  Fall, initial encounter    ED Discharge Orders    None       Davonna Belling, MD 05/03/19 2157

## 2019-07-12 ENCOUNTER — Encounter (HOSPITAL_COMMUNITY): Payer: Self-pay

## 2019-07-12 ENCOUNTER — Other Ambulatory Visit: Payer: Self-pay

## 2019-07-12 ENCOUNTER — Observation Stay (HOSPITAL_COMMUNITY)

## 2019-07-12 ENCOUNTER — Inpatient Hospital Stay (HOSPITAL_COMMUNITY)
Admission: EM | Admit: 2019-07-12 | Discharge: 2019-07-15 | DRG: 086 | Disposition: A | Discharge status: Hospice | Attending: Family Medicine | Admitting: Family Medicine

## 2019-07-12 ENCOUNTER — Emergency Department (HOSPITAL_COMMUNITY)

## 2019-07-12 DIAGNOSIS — Z20828 Contact with and (suspected) exposure to other viral communicable diseases: Secondary | ICD-10-CM | POA: Diagnosis present

## 2019-07-12 DIAGNOSIS — I609 Nontraumatic subarachnoid hemorrhage, unspecified: Secondary | ICD-10-CM

## 2019-07-12 DIAGNOSIS — F0280 Dementia in other diseases classified elsewhere without behavioral disturbance: Secondary | ICD-10-CM | POA: Diagnosis present

## 2019-07-12 DIAGNOSIS — S0181XA Laceration without foreign body of other part of head, initial encounter: Secondary | ICD-10-CM | POA: Diagnosis present

## 2019-07-12 DIAGNOSIS — Z823 Family history of stroke: Secondary | ICD-10-CM

## 2019-07-12 DIAGNOSIS — F0281 Dementia in other diseases classified elsewhere with behavioral disturbance: Secondary | ICD-10-CM | POA: Diagnosis present

## 2019-07-12 DIAGNOSIS — Z79899 Other long term (current) drug therapy: Secondary | ICD-10-CM

## 2019-07-12 DIAGNOSIS — S065XAA Traumatic subdural hemorrhage with loss of consciousness status unknown, initial encounter: Secondary | ICD-10-CM | POA: Insufficient documentation

## 2019-07-12 DIAGNOSIS — S12100A Unspecified displaced fracture of second cervical vertebra, initial encounter for closed fracture: Secondary | ICD-10-CM

## 2019-07-12 DIAGNOSIS — Z515 Encounter for palliative care: Secondary | ICD-10-CM | POA: Diagnosis present

## 2019-07-12 DIAGNOSIS — G309 Alzheimer's disease, unspecified: Secondary | ICD-10-CM | POA: Diagnosis present

## 2019-07-12 DIAGNOSIS — S12110A Anterior displaced Type II dens fracture, initial encounter for closed fracture: Secondary | ICD-10-CM | POA: Diagnosis present

## 2019-07-12 DIAGNOSIS — E876 Hypokalemia: Secondary | ICD-10-CM | POA: Diagnosis not present

## 2019-07-12 DIAGNOSIS — S12112A Nondisplaced Type II dens fracture, initial encounter for closed fracture: Secondary | ICD-10-CM | POA: Diagnosis not present

## 2019-07-12 DIAGNOSIS — Z7982 Long term (current) use of aspirin: Secondary | ICD-10-CM

## 2019-07-12 DIAGNOSIS — I10 Essential (primary) hypertension: Secondary | ICD-10-CM | POA: Diagnosis present

## 2019-07-12 DIAGNOSIS — R569 Unspecified convulsions: Secondary | ICD-10-CM | POA: Diagnosis present

## 2019-07-12 DIAGNOSIS — R627 Adult failure to thrive: Secondary | ICD-10-CM | POA: Diagnosis present

## 2019-07-12 DIAGNOSIS — Z8249 Family history of ischemic heart disease and other diseases of the circulatory system: Secondary | ICD-10-CM

## 2019-07-12 DIAGNOSIS — S0101XA Laceration without foreign body of scalp, initial encounter: Secondary | ICD-10-CM | POA: Diagnosis present

## 2019-07-12 DIAGNOSIS — S066X0A Traumatic subarachnoid hemorrhage without loss of consciousness, initial encounter: Principal | ICD-10-CM | POA: Diagnosis present

## 2019-07-12 DIAGNOSIS — Z66 Do not resuscitate: Secondary | ICD-10-CM | POA: Diagnosis present

## 2019-07-12 DIAGNOSIS — F0391 Unspecified dementia with behavioral disturbance: Secondary | ICD-10-CM | POA: Diagnosis present

## 2019-07-12 DIAGNOSIS — S065X9A Traumatic subdural hemorrhage with loss of consciousness of unspecified duration, initial encounter: Secondary | ICD-10-CM | POA: Insufficient documentation

## 2019-07-12 DIAGNOSIS — W050XXA Fall from non-moving wheelchair, initial encounter: Secondary | ICD-10-CM | POA: Diagnosis present

## 2019-07-12 DIAGNOSIS — Z8673 Personal history of transient ischemic attack (TIA), and cerebral infarction without residual deficits: Secondary | ICD-10-CM

## 2019-07-12 DIAGNOSIS — F03918 Unspecified dementia, unspecified severity, with other behavioral disturbance: Secondary | ICD-10-CM | POA: Diagnosis present

## 2019-07-12 LAB — CBC WITH DIFFERENTIAL/PLATELET
Abs Immature Granulocytes: 0.08 10*3/uL — ABNORMAL HIGH (ref 0.00–0.07)
Basophils Absolute: 0 10*3/uL (ref 0.0–0.1)
Basophils Relative: 1 %
Eosinophils Absolute: 0.1 10*3/uL (ref 0.0–0.5)
Eosinophils Relative: 2 %
HCT: 37.7 % — ABNORMAL LOW (ref 39.0–52.0)
Hemoglobin: 13.1 g/dL (ref 13.0–17.0)
Immature Granulocytes: 1 %
Lymphocytes Relative: 20 %
Lymphs Abs: 1.1 10*3/uL (ref 0.7–4.0)
MCH: 31.5 pg (ref 26.0–34.0)
MCHC: 34.7 g/dL (ref 30.0–36.0)
MCV: 90.6 fL (ref 80.0–100.0)
Monocytes Absolute: 0.6 10*3/uL (ref 0.1–1.0)
Monocytes Relative: 10 %
Neutro Abs: 3.7 10*3/uL (ref 1.7–7.7)
Neutrophils Relative %: 66 %
Platelets: 168 10*3/uL (ref 150–400)
RBC: 4.16 MIL/uL — ABNORMAL LOW (ref 4.22–5.81)
RDW: 14.1 % (ref 11.5–15.5)
WBC: 5.6 10*3/uL (ref 4.0–10.5)
nRBC: 0 % (ref 0.0–0.2)

## 2019-07-12 LAB — COMPREHENSIVE METABOLIC PANEL
ALT: 10 U/L (ref 0–44)
AST: 18 U/L (ref 15–41)
Albumin: 3.2 g/dL — ABNORMAL LOW (ref 3.5–5.0)
Alkaline Phosphatase: 59 U/L (ref 38–126)
Anion gap: 12 (ref 5–15)
BUN: 27 mg/dL — ABNORMAL HIGH (ref 8–23)
CO2: 20 mmol/L — ABNORMAL LOW (ref 22–32)
Calcium: 8.8 mg/dL — ABNORMAL LOW (ref 8.9–10.3)
Chloride: 108 mmol/L (ref 98–111)
Creatinine, Ser: 1.18 mg/dL (ref 0.61–1.24)
GFR calc Af Amer: >60 mL/min (ref >60)
GFR calc non Af Amer: 58 mL/min — ABNORMAL LOW (ref >60)
Glucose, Bld: 99 mg/dL (ref 70–99)
Potassium: 3.4 mmol/L — ABNORMAL LOW (ref 3.5–5.1)
Sodium: 140 mmol/L (ref 135–145)
Total Bilirubin: 0.9 mg/dL (ref 0.3–1.2)
Total Protein: 5.5 g/dL — ABNORMAL LOW (ref 6.5–8.1)

## 2019-07-12 LAB — VALPROIC ACID LEVEL: Valproic Acid Lvl: 43 ug/mL — ABNORMAL LOW (ref 50.0–100.0)

## 2019-07-12 MED ORDER — ACETAMINOPHEN 325 MG PO TABS: 650.0000 mg | ORAL_TABLET | Freq: Four times a day (QID) | ORAL | Status: DC | PRN

## 2019-07-12 MED ORDER — SODIUM CHLORIDE 0.9% FLUSH
3.0000 mL | Freq: Two times a day (BID) | INTRAVENOUS | Status: DC
  Administered 2019-07-13 – 2019-07-15 (×2): 3 mL via INTRAVENOUS

## 2019-07-12 MED ORDER — LIDOCAINE-EPINEPHRINE (PF) 2 %-1:200000 IJ SOLN
INTRAMUSCULAR | Status: AC
  Administered 2019-07-12: 10 mL via TRANSDERMAL
  Filled 2019-07-12: qty 20

## 2019-07-12 MED ORDER — LIDOCAINE-EPINEPHRINE (PF) 2 %-1:200000 IJ SOLN
INTRAMUSCULAR | Status: AC
  Administered 2019-07-12: 20 mL
  Filled 2019-07-12: qty 20

## 2019-07-12 MED ORDER — LEVETIRACETAM IN NACL 1000 MG/100ML IV SOLN
1000.0000 mg | Freq: Once | INTRAVENOUS | Status: AC
  Administered 2019-07-12: 1000 mg via INTRAVENOUS
  Filled 2019-07-12: qty 100

## 2019-07-12 MED ORDER — ONDANSETRON HCL 4 MG/2ML IJ SOLN: 4.0000 mg | Freq: Four times a day (QID) | INTRAMUSCULAR | Status: DC | PRN

## 2019-07-12 MED ORDER — ONDANSETRON HCL 4 MG PO TABS: 4.0000 mg | ORAL_TABLET | Freq: Four times a day (QID) | ORAL | Status: DC | PRN

## 2019-07-12 MED ORDER — ACETAMINOPHEN 650 MG RE SUPP: 650.0000 mg | Freq: Four times a day (QID) | RECTAL | Status: DC | PRN

## 2019-07-12 MED ORDER — LORAZEPAM 2 MG/ML IJ SOLN
1.0000 mg | Freq: Once | INTRAMUSCULAR | Status: AC
  Administered 2019-07-12: 1 mg via INTRAVENOUS
  Filled 2019-07-12: qty 1

## 2019-07-12 MED ORDER — LABETALOL HCL 5 MG/ML IV SOLN
10.0000 mg | INTRAVENOUS | Status: DC | PRN
  Administered 2019-07-13: 18:00:00 10 mg via INTRAVENOUS
  Filled 2019-07-12: qty 4

## 2019-07-12 MED ORDER — POTASSIUM CHLORIDE IN NACL 40-0.9 MEQ/L-% IV SOLN
INTRAVENOUS | Status: AC
  Administered 2019-07-12: 90 mL/h via INTRAVENOUS
  Filled 2019-07-12: qty 1000

## 2019-07-12 MED ORDER — LEVETIRACETAM IN NACL 500 MG/100ML IV SOLN
500.0000 mg | Freq: Two times a day (BID) | INTRAVENOUS | Status: DC
  Administered 2019-07-13 – 2019-07-15 (×5): 500 mg via INTRAVENOUS
  Filled 2019-07-12 (×5): qty 100

## 2019-07-12 MED ORDER — MORPHINE SULFATE (PF) 2 MG/ML IV SOLN
1.0000 mg | INTRAVENOUS | Status: DC | PRN
  Administered 2019-07-13: 2 mg via INTRAVENOUS
  Filled 2019-07-12: qty 1

## 2019-07-12 NOTE — ED Notes (Signed)
Patient cleaned and linens changed. Wound to left buttock and left hip noted (covered with Mepelex).

## 2019-07-12 NOTE — Consult Note (Signed)
NEURO HOSPITALIST CONSULT NOTE   Requestig physician: Dr. Sabra Heck  Reason for Consult: Concussive convulsion  History obtained from:  Chart     HPI:                                                                                                                                          Jacob York is an 84 y.o. male who presented from [facility] due to a fall that he had out of his wheelchair on Monday, striking his head. The fall impact was sufficient to result in a right frontal scalp hematoma and laceration. He was noted to have convulsions for a short period of time after his head hit the floor. The patient has Alzheimer's dementia and at baseline is confused.    CT head:  1. 9 mm focus of subarachnoid hemorrhage in the right sylvian fissure. 2. Acute nondisplaced type 2 dens fracture. 3. Small right frontal scalp hematoma and laceration.  Past Medical History:  Diagnosis Date  . Alzheimer's dementia ([place])   . Hypertension   . Stroke ([place])   . TIA (transient ischemic attack)   . Urinary retention 09/22/2014    Past Surgical History:  Procedure Laterality Date  . INGUINAL HERNIA REPAIR N/A 06/20/2014   Procedure: LAPAROSCOPIC BILATERAL INGUINAL HERNIA REPAIR, LEFT FEMORAL AND INCARCERATED SUPRA UMBILICAL HERNIA REPAIR WITH MESH;  Surgeon: Michael Boston, MD;  Location: [facility];  Service: General;  Laterality: N/A;  . INSERTION OF MESH N/A 06/20/2014   Procedure: INSERTION OF MESH;  Surgeon: Michael Boston, MD;  Location: [facility];  Service: General;  Laterality: N/A;    Family History  Problem Relation Age of Onset  . Stroke Mother   . Hypertension Mother               Social History:  reports that he has never smoked. He has never used smokeless tobacco. He reports that he does not drink alcohol or use drugs.  No Known Allergies  MEDICATIONS:                                                                                                                       No current facility-administered medications on file prior to encounter.    Current Outpatient  Medications on File Prior to Encounter  Medication Sig Dispense Refill  . alum & mag hydroxide-simeth (MINTOX) 200-200-20 MG/5ML suspension Take 30 mLs by mouth as needed for indigestion or heartburn.     Marland Kitchen. aspirin 81 MG chewable tablet Chew 81 mg by mouth daily with breakfast.     . cetirizine (ZYRTEC) 10 MG tablet Take 10 mg by mouth daily as needed (itching).    . cholecalciferol (VITAMIN D) 1000 UNITS tablet Take 1,000 Units by mouth daily with breakfast.     . divalproex (DEPAKOTE SPRINKLE) 125 MG capsule Take 250-500 mg by mouth daily. Takes 250 mg tablet by mouth in the morning and then take 500 mg tablet by mouth at night. May mix in applesauce or pudding    . LORazepam (ATIVAN) 0.5 MG tablet Take 0.5 mg by mouth daily.    Marland Kitchen. losartan (COZAAR) 25 MG tablet Take 25 mg by mouth daily.    . mirtazapine (REMERON) 30 MG tablet Take 30 mg by mouth at bedtime.    Marland Kitchen. QUEtiapine (SEROQUEL) 100 MG tablet Take 50-100 mg by mouth See admin instructions. Take 100mg  tablet by mouth at bedtime, then take 50mg  tablet by mouth in the morning    . tamsulosin (FLOMAX) 0.4 MG CAPS capsule Take 1 capsule (0.4 mg total) by mouth 2 (two) times daily. 30 capsule 5  . traZODone (DESYREL) 50 MG tablet Take 50 mg by mouth at bedtime.       ROS:                                                                                                                                       Unable to obtain due to AMS.    There were no vitals taken for this visit.   General Examination:                                                                                                       Physical Exam  HEENT-  Dressing in place for right forehead laceration. C-collar in place.  Lungs-Panting respirations with rate of 19. No grossly audible rhonchi.  Extremities- No edema  Neurological Examination Mental Status:  Awake and agitated. Will open eyes to voice but will not move eyes away from or towards visual stimuli. Nonverbal with no attempts to communicate. Does not follow any commands. Does not engage in purpuseful movements, but does intermittently demonstrate stereotyped hand-flailing and hand-wringing movements bilaterally.  Cranial Nerves: II: No blink to  threat. PERRL.   III,IV, VI: No ptosis. Eyes conjugate at the midline.  VII: Face grossly symmetric. Does not follow commands for assessment of grimace. VIII: Opens eyes to voice.  IX,X: Unable to assess XI: Head is midline XII: Does not protrude tongue to command Motor/Sensory: Moves upper and lower extremities equally to noxious - intermittent hand-wringing of upper extremities is noted.  Withdraws BLE semipurposefully to plantar stimulation with no asymmetry noted. Tends to keep legs in flexed position when at rest.  Deep Tendon Reflexes: 2+ and symmetric bilateral brachioradialis and biceps. 2+ patellae. Toes downgoing.  Cerebellar/Gait: Unable to assess   Lab Results: Basic Metabolic Panel: Recent Labs  Lab 07/12/19 1843  NA 140  K 3.4*  CL 108  CO2 20*  GLUCOSE 99  BUN 27*  CREATININE 1.18  CALCIUM 8.8*    CBC: Recent Labs  Lab 07/12/19 1843  WBC 5.6  NEUTROABS 3.7  HGB 13.1  HCT 37.7*  MCV 90.6  PLT 168    Cardiac Enzymes: No results for input(s): CKTOTAL, CKMB, CKMBINDEX, TROPONINI in the last 168 hours.  Lipid Panel: No results for input(s): CHOL, TRIG, HDL, CHOLHDL, VLDL, LDLCALC in the last 168 hours.  Imaging: Ct Head Wo Contrast  Result Date: 07/12/2019 CLINICAL DATA:  Larey Seat out of his wheelchair. EXAM: CT HEAD WITHOUT CONTRAST CT CERVICAL SPINE WITHOUT CONTRAST TECHNIQUE: Multidetector CT imaging of the head and cervical spine was performed following the standard protocol without intravenous contrast. Multiplanar CT image reconstructions of the cervical spine were also generated. COMPARISON:  CT head and  cervical spine dated May 03, 2019. FINDINGS: CT HEAD FINDINGS Brain: New 9 mm focus of subarachnoid hemorrhage in the right sylvian fissure. No evidence of acute infarction, hydrocephalus, extra-axial collection or mass lesion/mass effect. Chronic encephalomalacia right frontal lobe. Unchanged moderate to severe atrophy and mild chronic microvascular ischemic changes. Vascular: Calcified atherosclerosis at the skullbase. No hyperdense vessel. Skull: Negative for fracture or focal lesion. Unchanged thinning of the right frontal bone. Sinuses/Orbits: No acute finding. Other: Small right frontal scalp hematoma and laceration. CT CERVICAL SPINE FINDINGS Alignment: Unchanged 2 mm anterolisthesis at C4-C5 and C5-C6. No traumatic malalignment. Skull base and vertebrae: Acute nondisplaced fracture through the base of the dens (series 14, image 32). No additional fracture. Soft tissues and spinal canal: No prevertebral fluid or swelling. No visible canal hematoma. Disc levels: Prior C6-C7 ankylosis. Similar appearing multilevel degenerative disc disease and diffuse moderate facet arthropathy. Upper chest: Negative. Other: None. IMPRESSION: 1. 9 mm focus of subarachnoid hemorrhage in the right sylvian fissure. 2. Acute nondisplaced type 2 dens fracture. 3. Small right frontal scalp hematoma and laceration. Critical Value/emergent results were called by telephone at the time of interpretation on 07/12/2019 at 8:48 pm to providerBRIAN MILLER , who verbally acknowledged these results. Electronically Signed   By: Obie Dredge M.D.   On: 07/12/2019 20:52   Ct Cervical Spine Wo Contrast  Result Date: 07/12/2019 CLINICAL DATA:  Larey Seat out of his wheelchair. EXAM: CT HEAD WITHOUT CONTRAST CT CERVICAL SPINE WITHOUT CONTRAST TECHNIQUE: Multidetector CT imaging of the head and cervical spine was performed following the standard protocol without intravenous contrast. Multiplanar CT image reconstructions of the cervical spine were  also generated. COMPARISON:  CT head and cervical spine dated May 03, 2019. FINDINGS: CT HEAD FINDINGS Brain: New 9 mm focus of subarachnoid hemorrhage in the right sylvian fissure. No evidence of acute infarction, hydrocephalus, extra-axial collection or mass lesion/mass effect. Chronic encephalomalacia right frontal lobe.  Unchanged moderate to severe atrophy and mild chronic microvascular ischemic changes. Vascular: Calcified atherosclerosis at the skullbase. No hyperdense vessel. Skull: Negative for fracture or focal lesion. Unchanged thinning of the right frontal bone. Sinuses/Orbits: No acute finding. Other: Small right frontal scalp hematoma and laceration. CT CERVICAL SPINE FINDINGS Alignment: Unchanged 2 mm anterolisthesis at C4-C5 and C5-C6. No traumatic malalignment. Skull base and vertebrae: Acute nondisplaced fracture through the base of the dens (series 14, image 32). No additional fracture. Soft tissues and spinal canal: No prevertebral fluid or swelling. No visible canal hematoma. Disc levels: Prior C6-C7 ankylosis. Similar appearing multilevel degenerative disc disease and diffuse moderate facet arthropathy. Upper chest: Negative. Other: None. IMPRESSION: 1. 9 mm focus of subarachnoid hemorrhage in the right sylvian fissure. 2. Acute nondisplaced type 2 dens fracture. 3. Small right frontal scalp hematoma and laceration. Critical Value/emergent results were called by telephone at the time of interpretation on 07/12/2019 at 8:48 pm to providerBRIAN MILLER , who verbally acknowledged these results. Electronically Signed   By: Obie Dredge M.D.   On: 07/12/2019 20:52    Assessment: 83 year old male with seizure-like activity at SNF after falling and striking head. CT head reveals a small subarachnoid hemorrhage within the right Sylvian fissure 1. Presentation most consistent with concussive convulsion. He has no prior history of seizures.  2. Alzheimer dementia 3. Has been loaded with Keppra  1000 mg IV x 1.  4. CT head reveals a 9 mm focus of subarachnoid hemorrhage in the right Sylvian fissure.  5. CT neck reveals an acute nondisplaced type 2 dens fracture.  Recommendations: 1. EEG completed with results pending.   2. Continue Keppra at 500 mg IV BID.   3. Inpatient seizure precautions 4. Stop ASA  Electronically signed: Dr. Caryl Pina 07/12/2019, 9:04 PM

## 2019-07-12 NOTE — ED Triage Notes (Signed)
Pt came from [place] due to a fall that he had out of his w/c while at the facility. Pt was noted to have had convulsions for a short while after his head hit the floor. He obtained a small but deep laceration above his Rt eye that has been covered with gauze my EMS before arrival to ED. Pt is at baseline- confused, with a Hx of Alzheimer's. No family at bedside, C-collar on and aligned.

## 2019-07-12 NOTE — Progress Notes (Signed)
EEG complete - results pending 

## 2019-07-12 NOTE — H&P (Signed)
History and Physical    ILYAS York BJY:[id number] DOB: 09/16/1936 DOA: 07/12/2019  PCP: Ron Parker, MD   Patient coming from: SNF   Chief Complaint: Fall, seizure-like activity   HPI: Jacob York is a 83 y.o. male with medical history significant for history of CVA, hypertension, and Alzheimer dementia, presenting to the emergency department for evaluation of a fall and seizure like activity.  Patient is unable to contribute to the history due to his clinical condition with advanced dementia, reportedly fell out of his wheelchair, struck his head, and was noted to have generalized seizure-like activity that stopped after 2 or 3 minutes.  It was not clear if he began to have this seizure, which then caused him to fall out of his chair, or whether he fell and hit his head and then had the seizure.  ED Course: Upon arrival to the ED, patient is found to be mildly hypertensive.  Chemistry panel is notable for a potassium of 3.4.  CBC is unremarkable.  Noncontrast head CT is concerning for 9 mm subarachnoid hemorrhage in the right sylvian fissure, small right frontal scalp hematoma, and cervical spine CT reveals acute nondisplaced type II dens fracture.  Review of Systems:  Unable to complete ROS secondary to the patient's clinical condition.  Past Medical History:  Diagnosis Date   Alzheimer's dementia ([facility])    Hypertension    Stroke [facility])    TIA (transient ischemic attack)    Urinary retention 09/22/2014    Past Surgical History:  Procedure Laterality Date   INGUINAL HERNIA REPAIR N/A 06/20/2014   Procedure: LAPAROSCOPIC BILATERAL INGUINAL HERNIA REPAIR, LEFT FEMORAL AND INCARCERATED SUPRA UMBILICAL HERNIA REPAIR WITH MESH;  Surgeon: Karie Soda, MD;  Location: [facility];  Service: General;  Laterality: N/A;   INSERTION OF MESH N/A 06/20/2014   Procedure: INSERTION OF MESH;  Surgeon: Karie Soda, MD;  Location: [facility];  Service: General;  Laterality: N/A;     reports  that he has never smoked. He has never used smokeless tobacco. He reports that he does not drink alcohol or use drugs.  No Known Allergies  Family History  Problem Relation Age of Onset   Stroke Mother    Hypertension Mother      Prior to Admission medications   Medication Sig Start Date End Date Taking? Authorizing Provider  acetaminophen (TYLENOL) 500 MG tablet Take 500 mg by mouth every 4 (four) hours as needed for mild pain, moderate pain, fever or headache.     [provider]  alum & mag hydroxide-simeth (MINTOX) 200-200-20 MG/5ML suspension Take 30 mLs by mouth as needed for indigestion or heartburn.     [provider]  aspirin 81 MG chewable tablet Chew 81 mg by mouth daily with breakfast.     [provider]  cetirizine (ZYRTEC) 10 MG tablet Take 10 mg by mouth daily as needed (itching).    [provider]  cholecalciferol (VITAMIN D) 1000 UNITS tablet Take 1,000 Units by mouth daily with breakfast.     [provider]  divalproex (DEPAKOTE SPRINKLE) 125 MG capsule Take 250-500 mg by mouth 2 (two) times daily. Takes 250 mg in the morning and 500 mg at night. May mix in applesauce or pudding    [provider]  guaifenesin (ROBITUSSIN) 100 MG/5ML syrup Take 200 mg by mouth every 6 (six) hours as needed for cough.     [provider]  loperamide (IMODIUM) 2 MG capsule Take 2 mg  by mouth as needed for diarrhea or loose stools.     [provider]  LORazepam (ATIVAN) 0.5 MG tablet Take 1 every 8 hours if needed for anxiety or aggressive behavior 04/30/18   [name], MD  magnesium hydroxide (MILK OF MAGNESIA) 400 MG/5ML suspension Take 30 mLs by mouth at bedtime as needed for moderate constipation. Reported on 01/02/2016    [provider]  mirtazapine (REMERON) 30 MG tablet Take 30 mg by mouth at bedtime.    [provider]  NAMENDA XR 14 MG CP24 24 hr capsule Take 14 mg by mouth daily with  breakfast.  01/30/16   [provider]  neomycin-bacitracin-polymyxin (NEOSPORIN) [phone] ointment Apply 1 application topically daily as needed (for skin tears, abrasions, or minor irritation).     [provider]  QUEtiapine (SEROQUEL) 25 MG tablet Take 75 mg by mouth at bedtime.     [provider]  QUEtiapine (SEROQUEL) 50 MG tablet Take 50 mg by mouth daily.  04/07/18   [provider]  Skin Protectants, Misc. (MINERIN) CREA Apply 1 application topically daily as needed (dry skin).    [provider]  tamsulosin (FLOMAX) 0.4 MG CAPS capsule Take 1 capsule (0.4 mg total) by mouth 2 (two) times daily. 03/21/16   [name], MD  traZODone (DESYREL) 50 MG tablet Take 50 mg by mouth at bedtime.    [provider]    Physical Exam: There were no vitals filed for this visit.   Constitutional: NAD, calm  Eyes: PERTLA, lids and conjunctivae normal ENMT: Mucous membranes are moist. Posterior pharynx clear of any exudate or lesions.   Neck: cervical collar in place, no masses, no thyromegaly Respiratory:  no wheezing, no crackles. Normal respiratory effort. No accessory muscle use.  Cardiovascular: S1 & S2 heard, regular rate and rhythm.  No extremity edema.  Abdomen: No distension, no tenderness, soft. Bowel sounds active.  Musculoskeletal: no clubbing / cyanosis. No joint deformity upper and lower extremities.    Skin: Laceration to forehead. Warm, dry, well-perfused. Neurologic: No gross facial asymmetry. Sensation intact. Moving all extremities.  Psychiatric: Alert. No meaningful verbal responses. Follows commands. Calm.    Labs on Admission: I have personally reviewed following labs and imaging studies  CBC: Recent Labs  Lab 07/12/19 1843  WBC 5.6  NEUTROABS 3.7  HGB 13.1  HCT 37.7*  MCV 90.6  PLT 168   Basic Metabolic Panel: Recent Labs  Lab 07/12/19 1843  NA 140  K 3.4*  CL 108  CO2 20*  GLUCOSE 99  BUN 27*    CREATININE 1.18  CALCIUM 8.8*   GFR: CrCl cannot be calculated (Unknown ideal weight.). Liver Function Tests: Recent Labs  Lab 07/12/19 1843  AST 18  ALT 10  ALKPHOS 59  BILITOT 0.9  PROT 5.5*  ALBUMIN 3.2*   No results for input(s): LIPASE, AMYLASE in the last 168 hours. No results for input(s): AMMONIA in the last 168 hours. Coagulation Profile: No results for input(s): INR, PROTIME in the last 168 hours. Cardiac Enzymes: No results for input(s): CKTOTAL, CKMB, CKMBINDEX, TROPONINI in the last 168 hours. BNP (last 3 results) No results for input(s): PROBNP in the last 8760 hours. HbA1C: No results for input(s): HGBA1C in the last 72 hours. CBG: No results for input(s): GLUCAP in the last 168 hours. Lipid Profile: No results for input(s): CHOL, HDL, LDLCALC, TRIG, CHOLHDL, LDLDIRECT in the last 72 hours. Thyroid Function Tests: No results for input(s): TSH,  T4TOTAL, FREET4, T3FREE, THYROIDAB in the last 72 hours. Anemia Panel: No results for input(s): VITAMINB12, FOLATE, FERRITIN, TIBC, IRON, RETICCTPCT in the last 72 hours. Urine analysis:    Component Value Date/Time   COLORURINE YELLOW 04/30/2018 1414   APPEARANCEUR CLEAR 04/30/2018 1414   LABSPEC 1.010 04/30/2018 1414   PHURINE 9.0 (H) 04/30/2018 1414   GLUCOSEU NEGATIVE 04/30/2018 1414   HGBUR NEGATIVE 04/30/2018 1414   BILIRUBINUR NEGATIVE 04/30/2018 1414   KETONESUR NEGATIVE 04/30/2018 1414   PROTEINUR NEGATIVE 04/30/2018 1414   UROBILINOGEN 0.2 07/31/2015 0118   NITRITE NEGATIVE 04/30/2018 1414   LEUKOCYTESUR NEGATIVE 04/30/2018 1414   Sepsis Labs: @LABRCNTIP (procalcitonin:4,lacticidven:4) )No results found for this or any previous visit (from the past 240 hour(s)).   Radiological Exams on Admission: Ct Head Wo Contrast  Result Date: 07/12/2019 CLINICAL DATA:  [place] out of his wheelchair. EXAM: CT HEAD WITHOUT CONTRAST CT CERVICAL SPINE WITHOUT CONTRAST TECHNIQUE: Multidetector CT imaging of the head  and cervical spine was performed following the standard protocol without intravenous contrast. Multiplanar CT image reconstructions of the cervical spine were also generated. COMPARISON:  CT head and cervical spine dated May 03, 2019. FINDINGS: CT HEAD FINDINGS Brain: New 9 mm focus of subarachnoid hemorrhage in the right sylvian fissure. No evidence of acute infarction, hydrocephalus, extra-axial collection or mass lesion/mass effect. Chronic encephalomalacia right frontal lobe. Unchanged moderate to severe atrophy and mild chronic microvascular ischemic changes. Vascular: Calcified atherosclerosis at the skullbase. No hyperdense vessel. Skull: Negative for fracture or focal lesion. Unchanged thinning of the right frontal bone. Sinuses/Orbits: No acute finding. Other: Small right frontal scalp hematoma and laceration. CT CERVICAL SPINE FINDINGS Alignment: Unchanged 2 mm anterolisthesis at C4-C5 and C5-C6. No traumatic malalignment. Skull base and vertebrae: Acute nondisplaced fracture through the base of the dens (series 14, image 32). No additional fracture. Soft tissues and spinal canal: No prevertebral fluid or swelling. No visible canal hematoma. Disc levels: Prior C6-C7 ankylosis. Similar appearing multilevel degenerative disc disease and diffuse moderate facet arthropathy. Upper chest: Negative. Other: None. IMPRESSION: 1. 9 mm focus of subarachnoid hemorrhage in the right sylvian fissure. 2. Acute nondisplaced type 2 dens fracture. 3. Small right frontal scalp hematoma and laceration. Critical Value/emergent results were called by telephone at the time of interpretation on 07/12/2019 at 8:48 pm to providerBRIAN MILLER , who verbally acknowledged these results. Electronically Signed   By: Titus Dubin M.D.   On: 07/12/2019 20:52   Ct Cervical Spine Wo Contrast  Result Date: 07/12/2019 CLINICAL DATA:  [place] out of his wheelchair. EXAM: CT HEAD WITHOUT CONTRAST CT CERVICAL SPINE WITHOUT CONTRAST  TECHNIQUE: Multidetector CT imaging of the head and cervical spine was performed following the standard protocol without intravenous contrast. Multiplanar CT image reconstructions of the cervical spine were also generated. COMPARISON:  CT head and cervical spine dated May 03, 2019. FINDINGS: CT HEAD FINDINGS Brain: New 9 mm focus of subarachnoid hemorrhage in the right sylvian fissure. No evidence of acute infarction, hydrocephalus, extra-axial collection or mass lesion/mass effect. Chronic encephalomalacia right frontal lobe. Unchanged moderate to severe atrophy and mild chronic microvascular ischemic changes. Vascular: Calcified atherosclerosis at the skullbase. No hyperdense vessel. Skull: Negative for fracture or focal lesion. Unchanged thinning of the right frontal bone. Sinuses/Orbits: No acute finding. Other: Small right frontal scalp hematoma and laceration. CT CERVICAL SPINE FINDINGS Alignment: Unchanged 2 mm anterolisthesis at C4-C5 and C5-C6. No traumatic malalignment. Skull base and vertebrae: Acute nondisplaced fracture through the base of the dens (series  14, image 32). No additional fracture. Soft tissues and spinal canal: No prevertebral fluid or swelling. No visible canal hematoma. Disc levels: Prior C6-C7 ankylosis. Similar appearing multilevel degenerative disc disease and diffuse moderate facet arthropathy. Upper chest: Negative. Other: None. IMPRESSION: 1. 9 mm focus of subarachnoid hemorrhage in the right sylvian fissure. 2. Acute nondisplaced type 2 dens fracture. 3. Small right frontal scalp hematoma and laceration. Critical Value/emergent results were called by telephone at the time of interpretation on 07/12/2019 at 8:48 pm to providerBRIAN MILLER , who verbally acknowledged these results. Electronically Signed   By: Obie DredgeWilliam T Derry M.D.   On: 07/12/2019 20:52    EKG: Not performed.   Assessment/Plan   1. Subarachnoid hemorrhage - Presents after a fall with head injury and  generalized seizure-like activity, found to have subarachnoid hemorrhage in right Sylvia fissure on head CT  - Neurosurgery was consulted by ED physician and much appreciated  - Continue neuro checks and seizure precautions, hold ASA, repeat head CT tomorrow    2. Type II dens fracture  - Patient presents after fall from his wheelchair, found to have non-displaced type II dens fracture  - Neurosurgery was consulted by ED physician and recommends Aspen collar, no surgical intervention anticipated   3. Seizure  - Presents after a fall with head injury and generalized seizure-like activity - Head CT with SAH as above  - He was given Keppra 1 g IV in ED; there is no seizure-like activity at time of admission  - Appreciate neurology consultation, will continue Keppra 500 mg BID, check EEG    4. Dementia  - Sedating medications held initially    5. Hypokalmeia  - KCl added to IVF, repeat chem panel in am     PPE: Mask, face shield  DVT prophylaxis: SCD's  Code Status: DNR  Family Communication: Discussed with patient  Consults called: Neurosurgery and neurology consulted by ED physician  Admission status: Observation     Briscoe Deutscherimothy S Nik Gorrell, MD [facility] Pager [phone]  If 7PM-7AM, please contact night-coverage [email or web address] Password [email or web address]  07/12/2019, 9:37 PM

## 2019-07-12 NOTE — ED Provider Notes (Signed)
[facility] EMERGENCY DEPARTMENT Provider Note   CSN: [id number] Arrival date & time: 07/12/19  1749     History   Chief Complaint No chief complaint on file.   HPI Jacob York is a 83 y.o. male.     HPI  This patient is an 83 year old male, he has known Alzheimer's dementia which is progressive and severe, he does not talk, he has also had a stroke, hypertension and lives in a nursing facility.  He was at that nursing facility sitting in a wheelchair when he was seen to fall out of the wheelchair striking his right forehead on the ground causing a laceration.  He subsequently had 2 to 3 minutes of tonic-clonic activity which stopped spontaneously.  He was then transported by EMS for further evaluation.  The patient does not have any complaints, he is nonverbal, he does not appear to be bothered by the laceration on his head which was dressed with a pressure dressing by EMS.  He does have a DO NOT RESUSCITATE order with him.  There is no reported history of seizures.  He does take Depakote for unknown reasons.  Past Medical History:  Diagnosis Date   Alzheimer's dementia (HCC)    Hypertension    Stroke [facility])    TIA (transient ischemic attack)    Urinary retention 09/22/2014    Patient Active Problem List   Diagnosis Date Noted   Agitation 12/02/2015   UTI (lower urinary tract infection) 03/03/2015   Pre-syncope 10/15/2014   H/O urinary retention 09/22/2014   Bigeminy 09/21/2014   Syncope 09/21/2014   Bradycardia 09/21/2014   Chronic renal insufficiency, stage III (moderate) 09/21/2014   Ventral hernia 06/20/2014   Bilateral inguinal hernia (BIH) L>>R 05/24/2014   Incarcerated ventral hernia - 4cm supraumbilical 05/24/2014   Diastasis recti 05/24/2014   Memory loss 05/24/2014   Dementia with behavioral disturbance (HCC)    Hypertension     Past Surgical History:  Procedure Laterality Date   INGUINAL HERNIA REPAIR N/A  06/20/2014   Procedure: LAPAROSCOPIC BILATERAL INGUINAL HERNIA REPAIR, LEFT FEMORAL AND INCARCERATED SUPRA UMBILICAL HERNIA REPAIR WITH MESH;  Surgeon: Karie Soda, MD;  Location: [facility] ORS;  Service: General;  Laterality: N/A;   INSERTION OF MESH N/A 06/20/2014   Procedure: INSERTION OF MESH;  Surgeon: Karie Soda, MD;  Location: [facility] ORS;  Service: General;  Laterality: N/A;        Home Medications    Prior to Admission medications   Medication Sig Start Date End Date Taking? Authorizing Provider  acetaminophen (TYLENOL) 500 MG tablet Take 500 mg by mouth every 4 (four) hours as needed for mild pain, moderate pain, fever or headache.     [provider]  alum & mag hydroxide-simeth (MINTOX) 200-200-20 MG/5ML suspension Take 30 mLs by mouth as needed for indigestion or heartburn.     [provider]  aspirin 81 MG chewable tablet Chew 81 mg by mouth daily with breakfast.     [provider]  cetirizine (ZYRTEC) 10 MG tablet Take 10 mg by mouth daily as needed (itching).    [provider]  cholecalciferol (VITAMIN D) 1000 UNITS tablet Take 1,000 Units by mouth daily with breakfast.     [provider]  divalproex (DEPAKOTE SPRINKLE) 125 MG capsule Take 250-500 mg by mouth 2 (two) times daily. Takes 250 mg in the morning and 500 mg at night. May mix in applesauce or pudding    [provider]  guaifenesin (ROBITUSSIN) 100 MG/5ML syrup Take 200 mg by mouth every 6 (six) hours as needed for cough.     [provider]  loperamide (IMODIUM) 2 MG capsule Take 2 mg by mouth as needed for diarrhea or loose stools.     [provider]  LORazepam (ATIVAN) 0.5 MG tablet Take 1 every 8 hours if needed for anxiety or aggressive behavior 04/30/18   Bethann BerkshireZammit, Joseph, MD  magnesium hydroxide (MILK OF MAGNESIA) 400 MG/5ML suspension Take 30 mLs by mouth at bedtime as needed for moderate constipation. Reported on 01/02/2016    [provider]  mirtazapine (REMERON) 30 MG tablet Take 30 mg by mouth at bedtime.    [provider]  NAMENDA XR 14 MG CP24 24 hr capsule Take 14 mg by mouth daily with breakfast.  01/30/16   [provider]  neomycin-bacitracin-polymyxin (NEOSPORIN) [phone] ointment Apply 1 application topically daily as needed (for skin tears, abrasions, or minor irritation).     [provider]  QUEtiapine (SEROQUEL) 25 MG tablet Take 75 mg by mouth at bedtime.     [provider]  QUEtiapine (SEROQUEL) 50 MG tablet Take 50 mg by mouth daily.  04/07/18   [provider]  Skin Protectants, Misc. (MINERIN) CREA Apply 1 application topically daily as needed (dry skin).    [provider]  tamsulosin (FLOMAX) 0.4 MG CAPS capsule Take 1 capsule (0.4 mg total) by mouth 2 (two) times daily. 03/21/16   Nelva NayBeaton, Robert, MD  traZODone (DESYREL) 50 MG tablet Take 50 mg by mouth at bedtime.    [provider]    Family History Family History  Problem Relation Age of Onset   Stroke Mother    Hypertension Mother     Social History Social History   Tobacco Use   Smoking status: Never Smoker   Smokeless tobacco: Never Used  Substance Use Topics   Alcohol use: No   Drug use: No     Allergies   Patient has no known allergies.   Review of Systems Review of Systems  Unable to perform ROS: Dementia     Physical Exam Updated Vital Signs There were no vitals taken for this visit.  Physical Exam Vitals signs and nursing note reviewed.  Constitutional:      General: He is not in acute distress.    Appearance: He is well-developed.  HENT:     Head: Normocephalic.     Comments: 3 cm laceration to the R forehead - minimal bleeding mild surrounding hematoma.  There is no signs of malocclusion, the patient does not have any other signs of facial injury, he is immobilized in a cervical collar    Mouth/Throat:     Pharynx: No oropharyngeal exudate.    Eyes:     General: No scleral icterus.       Right eye: No discharge.        Left eye: No discharge.     Conjunctiva/sclera: Conjunctivae normal.     Pupils: Pupils are equal, round, and reactive to light.  Neck:     Musculoskeletal: Normal range of motion and neck supple.     Thyroid: No thyromegaly.     Vascular: No JVD.  Cardiovascular:     Rate and Rhythm: Normal rate and regular rhythm.     Heart sounds: Normal heart sounds. No murmur. No friction rub. No gallop.   Pulmonary:     Effort: Pulmonary effort is normal. No respiratory distress.  Breath sounds: Normal breath sounds. No wheezing or rales.  Abdominal:     General: Bowel sounds are normal. There is no distension.     Palpations: Abdomen is soft. There is no mass.     Tenderness: There is no abdominal tenderness.  Musculoskeletal: Normal range of motion.        General: No tenderness.     Comments: All 4 extremities with normal appearing compartments, there is no tightness or injuries to the compartments, the joints appear supple, he does have a slight decubitus on his left hip which is very superficial and has been dressed.  Lymphadenopathy:     Cervical: No cervical adenopathy.  Skin:    General: Skin is warm and dry.     Findings: No erythema or rash.  Neurological:     Mental Status: He is alert.     Coordination: Coordination normal.     Comments: The patient is awake and alert, he does not follow commands, occasionally he will squeeze her hands, he is rubbing his hands together incessantly, he is looking around the room, he does seem to move all 4 extremities spontaneously.  No speech  Psychiatric:        Behavior: Behavior normal.      ED Treatments / Results  Labs (all labs ordered are listed, but only abnormal results are displayed) Labs Reviewed  VALPROIC ACID LEVEL  CBC WITH DIFFERENTIAL/PLATELET  COMPREHENSIVE METABOLIC PANEL    EKG None  Radiology Ct Head Wo Contrast  Result Date:  07/12/2019 CLINICAL DATA:  Larey Seat out of his wheelchair. EXAM: CT HEAD WITHOUT CONTRAST CT CERVICAL SPINE WITHOUT CONTRAST TECHNIQUE: Multidetector CT imaging of the head and cervical spine was performed following the standard protocol without intravenous contrast. Multiplanar CT image reconstructions of the cervical spine were also generated. COMPARISON:  CT head and cervical spine dated May 03, 2019. FINDINGS: CT HEAD FINDINGS Brain: New 9 mm focus of subarachnoid hemorrhage in the right sylvian fissure. No evidence of acute infarction, hydrocephalus, extra-axial collection or mass lesion/mass effect. Chronic encephalomalacia right frontal lobe. Unchanged moderate to severe atrophy and mild chronic microvascular ischemic changes. Vascular: Calcified atherosclerosis at the skullbase. No hyperdense vessel. Skull: Negative for fracture or focal lesion. Unchanged thinning of the right frontal bone. Sinuses/Orbits: No acute finding. Other: Small right frontal scalp hematoma and laceration. CT CERVICAL SPINE FINDINGS Alignment: Unchanged 2 mm anterolisthesis at C4-C5 and C5-C6. No traumatic malalignment. Skull base and vertebrae: Acute nondisplaced fracture through the base of the dens (series 14, image 32). No additional fracture. Soft tissues and spinal canal: No prevertebral fluid or swelling. No visible canal hematoma. Disc levels: Prior C6-C7 ankylosis. Similar appearing multilevel degenerative disc disease and diffuse moderate facet arthropathy. Upper chest: Negative. Other: None. IMPRESSION: 1. 9 mm focus of subarachnoid hemorrhage in the right sylvian fissure. 2. Acute nondisplaced type 2 dens fracture. 3. Small right frontal scalp hematoma and laceration. Critical Value/emergent results were called by telephone at the time of interpretation on 07/12/2019 at 8:48 pm to providerBRIAN Ares Tegtmeyer , who verbally acknowledged these results. Electronically Signed   By: Obie Dredge M.D.   On: 07/12/2019 20:52   Ct  Cervical Spine Wo Contrast  Result Date: 07/12/2019 CLINICAL DATA:  Larey Seat out of his wheelchair. EXAM: CT HEAD WITHOUT CONTRAST CT CERVICAL SPINE WITHOUT CONTRAST TECHNIQUE: Multidetector CT imaging of the head and cervical spine was performed following the standard protocol without intravenous contrast. Multiplanar CT image reconstructions of the cervical spine were  also generated. COMPARISON:  CT head and cervical spine dated May 03, 2019. FINDINGS: CT HEAD FINDINGS Brain: New 9 mm focus of subarachnoid hemorrhage in the right sylvian fissure. No evidence of acute infarction, hydrocephalus, extra-axial collection or mass lesion/mass effect. Chronic encephalomalacia right frontal lobe. Unchanged moderate to severe atrophy and mild chronic microvascular ischemic changes. Vascular: Calcified atherosclerosis at the skullbase. No hyperdense vessel. Skull: Negative for fracture or focal lesion. Unchanged thinning of the right frontal bone. Sinuses/Orbits: No acute finding. Other: Small right frontal scalp hematoma and laceration. CT CERVICAL SPINE FINDINGS Alignment: Unchanged 2 mm anterolisthesis at C4-C5 and C5-C6. No traumatic malalignment. Skull base and vertebrae: Acute nondisplaced fracture through the base of the dens (series 14, image 32). No additional fracture. Soft tissues and spinal canal: No prevertebral fluid or swelling. No visible canal hematoma. Disc levels: Prior C6-C7 ankylosis. Similar appearing multilevel degenerative disc disease and diffuse moderate facet arthropathy. Upper chest: Negative. Other: None. IMPRESSION: 1. 9 mm focus of subarachnoid hemorrhage in the right sylvian fissure. 2. Acute nondisplaced type 2 dens fracture. 3. Small right frontal scalp hematoma and laceration. Critical Value/emergent results were called by telephone at the time of interpretation on 07/12/2019 at 8:48 pm to providerBRIAN Devon Kingdon , who verbally acknowledged these results. Electronically Signed   By: Obie Dredge M.D.   On: 07/12/2019 20:52    Procedures .Critical Care Performed by: Eber Hong, MD Authorized by: Eber Hong, MD   Critical care provider statement:    Critical care time (minutes):  35   Critical care time was exclusive of:  Separately billable procedures and treating other patients and teaching time   Critical care was necessary to treat or prevent imminent or life-threatening deterioration of the following conditions:  CNS failure or compromise and trauma   Critical care was time spent personally by me on the following activities:  Blood draw for specimens, development of treatment plan with patient or surrogate, discussions with consultants, evaluation of patient's response to treatment, examination of patient, obtaining history from patient or surrogate, ordering and performing treatments and interventions, ordering and review of laboratory studies, ordering and review of radiographic studies, pulse oximetry, re-evaluation of patient's condition and review of old charts  .Marland KitchenLaceration Repair  Date/Time: 07/12/2019 10:14 PM Performed by: Eber Hong, MD Authorized by: Eber Hong, MD   Consent:    Consent obtained:  Verbal   Consent given by:  Patient   Risks discussed:  Infection, pain, need for additional repair, poor cosmetic result and poor wound healing   Alternatives discussed:  No treatment and delayed treatment Anesthesia (see MAR for exact dosages):    Anesthesia method:  Local infiltration   Local anesthetic:  Lidocaine 1% WITH epi Laceration details:    Location:  Face   Face location:  Forehead   Length (cm):  3   Depth (mm):  3 Repair type:    Repair type:  Intermediate Pre-procedure details:    Preparation:  Patient was prepped and draped in usual sterile fashion and imaging obtained to evaluate for foreign bodies Exploration:    Hemostasis achieved with:  Direct pressure   Wound exploration: wound explored through full range of motion and  entire depth of wound probed and visualized     Wound extent: no fascia violation noted, no foreign bodies/material noted, no muscle damage noted, no nerve damage noted, no tendon damage noted, no underlying fracture noted and no vascular damage noted   Treatment:    Area cleansed  with:  Betadine   Amount of cleaning:  Standard   Irrigation solution:  Sterile saline   Irrigation volume:  100   Irrigation method:  Syringe Skin repair:    Repair method:  Sutures   Suture size:  4-0   Suture material:  Prolene   Suture technique:  Simple interrupted   Number of sutures:  4 Approximation:    Approximation:  Close Post-procedure details:    Dressing:  Antibiotic ointment and sterile dressing   Patient tolerance of procedure:  Tolerated well, no immediate complications Comments:     This wound was irregular, ragged edges, it was reapproximated to the best of my ability and closed with 4 sutures of 4-0 Prolene.  He tolerated this very well.     (including critical care time)  Medications Ordered in ED Medications - No data to display   Initial Impression / Assessment and Plan / ED Course  I have reviewed the triage vital signs and the nursing notes.  Pertinent labs & imaging results that were available during my care of the patient were reviewed by me and considered in my medical decision making (see chart for details).        The patient has had a seizure, it appears to be new onset, possibly related to trauma or possibly starting prior to the trauma.  Will obtain CT scan of the brain, check Depakote level and labs, the patient will need a primary repair of the laceration of the forehead.  I personally looked at the CT scan of the brain and see subarachnoid bleeding.  I also looked at the CT scan of the cervical spine and there is a type II dens fracture of C2.  The blood work is unremarkable, the patient has a mild hypokalemia, Depakote level is just barely subtherapeutic.  I  discussed the care with the hospitalist as well as with the neuro hospitalist and the neurosurgeon.  The neurosurgical team will see the patient in the morning and agree with repeat CT scan tomorrow, no aggressive intervention, maintain cervical collar at this time.  Discussed with neurology who recommends Keppra twice daily 500 mg, EEG and will see in consultation  Discussed with Dr. Noberto Retort who will admit the patient to the hospital.  I have repaired the laceration, see repair note above.  Final Clinical Impressions(s) / ED Diagnoses   Final diagnoses:  SAH (subarachnoid hemorrhage) ([place])  Seizure ([place])  Closed odontoid fracture, initial encounter [facility])  Facial laceration, initial encounter      Noemi Chapel, MD 07/12/19 2216

## 2019-07-13 ENCOUNTER — Observation Stay (HOSPITAL_COMMUNITY)

## 2019-07-13 DIAGNOSIS — S12100A Unspecified displaced fracture of second cervical vertebra, initial encounter for closed fracture: Secondary | ICD-10-CM | POA: Diagnosis not present

## 2019-07-13 DIAGNOSIS — Z8249 Family history of ischemic heart disease and other diseases of the circulatory system: Secondary | ICD-10-CM | POA: Diagnosis not present

## 2019-07-13 DIAGNOSIS — W050XXA Fall from non-moving wheelchair, initial encounter: Secondary | ICD-10-CM | POA: Diagnosis present

## 2019-07-13 DIAGNOSIS — R627 Adult failure to thrive: Secondary | ICD-10-CM | POA: Diagnosis present

## 2019-07-13 DIAGNOSIS — F0280 Dementia in other diseases classified elsewhere without behavioral disturbance: Secondary | ICD-10-CM | POA: Diagnosis present

## 2019-07-13 DIAGNOSIS — Z66 Do not resuscitate: Secondary | ICD-10-CM | POA: Diagnosis present

## 2019-07-13 DIAGNOSIS — S0181XA Laceration without foreign body of other part of head, initial encounter: Secondary | ICD-10-CM

## 2019-07-13 DIAGNOSIS — S12112A Nondisplaced Type II dens fracture, initial encounter for closed fracture: Secondary | ICD-10-CM

## 2019-07-13 DIAGNOSIS — E876 Hypokalemia: Secondary | ICD-10-CM

## 2019-07-13 DIAGNOSIS — Z515 Encounter for palliative care: Secondary | ICD-10-CM | POA: Diagnosis present

## 2019-07-13 DIAGNOSIS — Z7982 Long term (current) use of aspirin: Secondary | ICD-10-CM | POA: Diagnosis not present

## 2019-07-13 DIAGNOSIS — G309 Alzheimer's disease, unspecified: Secondary | ICD-10-CM | POA: Diagnosis present

## 2019-07-13 DIAGNOSIS — I609 Nontraumatic subarachnoid hemorrhage, unspecified: Secondary | ICD-10-CM

## 2019-07-13 DIAGNOSIS — Z8673 Personal history of transient ischemic attack (TIA), and cerebral infarction without residual deficits: Secondary | ICD-10-CM | POA: Diagnosis not present

## 2019-07-13 DIAGNOSIS — Z823 Family history of stroke: Secondary | ICD-10-CM | POA: Diagnosis not present

## 2019-07-13 DIAGNOSIS — S066X0A Traumatic subarachnoid hemorrhage without loss of consciousness, initial encounter: Secondary | ICD-10-CM | POA: Diagnosis present

## 2019-07-13 DIAGNOSIS — F0281 Dementia in other diseases classified elsewhere with behavioral disturbance: Secondary | ICD-10-CM | POA: Diagnosis present

## 2019-07-13 DIAGNOSIS — Z20828 Contact with and (suspected) exposure to other viral communicable diseases: Secondary | ICD-10-CM | POA: Diagnosis present

## 2019-07-13 DIAGNOSIS — S12110A Anterior displaced Type II dens fracture, initial encounter for closed fracture: Secondary | ICD-10-CM | POA: Diagnosis present

## 2019-07-13 DIAGNOSIS — Z79899 Other long term (current) drug therapy: Secondary | ICD-10-CM | POA: Diagnosis not present

## 2019-07-13 DIAGNOSIS — S0101XA Laceration without foreign body of scalp, initial encounter: Secondary | ICD-10-CM | POA: Diagnosis present

## 2019-07-13 DIAGNOSIS — R569 Unspecified convulsions: Secondary | ICD-10-CM | POA: Diagnosis present

## 2019-07-13 DIAGNOSIS — I10 Essential (primary) hypertension: Secondary | ICD-10-CM | POA: Diagnosis present

## 2019-07-13 LAB — CBC WITH DIFFERENTIAL/PLATELET
Abs Immature Granulocytes: 0.13 10*3/uL — ABNORMAL HIGH (ref 0.00–0.07)
Basophils Absolute: 0 10*3/uL (ref 0.0–0.1)
Basophils Relative: 1 %
Eosinophils Absolute: 0.1 10*3/uL (ref 0.0–0.5)
Eosinophils Relative: 1 %
HCT: 39.3 % (ref 39.0–52.0)
Hemoglobin: 13.1 g/dL (ref 13.0–17.0)
Immature Granulocytes: 2 %
Lymphocytes Relative: 15 %
Lymphs Abs: 1.2 10*3/uL (ref 0.7–4.0)
MCH: 29.9 pg (ref 26.0–34.0)
MCHC: 33.3 g/dL (ref 30.0–36.0)
MCV: 89.7 fL (ref 80.0–100.0)
Monocytes Absolute: 0.7 10*3/uL (ref 0.1–1.0)
Monocytes Relative: 10 %
Neutro Abs: 5.6 10*3/uL (ref 1.7–7.7)
Neutrophils Relative %: 71 %
Platelets: 190 10*3/uL (ref 150–400)
RBC: 4.38 MIL/uL (ref 4.22–5.81)
RDW: 14.1 % (ref 11.5–15.5)
WBC: 7.7 10*3/uL (ref 4.0–10.5)
nRBC: 0 % (ref 0.0–0.2)

## 2019-07-13 LAB — BASIC METABOLIC PANEL
Anion gap: 12 (ref 5–15)
BUN: 24 mg/dL — ABNORMAL HIGH (ref 8–23)
CO2: 18 mmol/L — ABNORMAL LOW (ref 22–32)
Calcium: 8.8 mg/dL — ABNORMAL LOW (ref 8.9–10.3)
Chloride: 109 mmol/L (ref 98–111)
Creatinine, Ser: 1.16 mg/dL (ref 0.61–1.24)
GFR calc Af Amer: >60 mL/min (ref >60)
GFR calc non Af Amer: 59 mL/min — ABNORMAL LOW (ref >60)
Glucose, Bld: 93 mg/dL (ref 70–99)
Potassium: 3.7 mmol/L (ref 3.5–5.1)
Sodium: 139 mmol/L (ref 135–145)

## 2019-07-13 LAB — SARS CORONAVIRUS 2 (TAT 6-24 HRS): SARS Coronavirus 2: NEGATIVE

## 2019-07-13 LAB — MAGNESIUM: Magnesium: 1.8 mg/dL (ref 1.7–2.4)

## 2019-07-13 LAB — MRSA PCR SCREENING: MRSA by PCR: NEGATIVE

## 2019-07-13 MED ORDER — LOSARTAN POTASSIUM 25 MG PO TABS
25.0000 mg | ORAL_TABLET | Freq: Every day | ORAL | Status: DC
  Administered 2019-07-13 – 2019-07-15 (×3): 25 mg via ORAL
  Filled 2019-07-13 (×3): qty 1

## 2019-07-13 NOTE — Progress Notes (Signed)
PT Cancellation Note  Patient Details Name: Jacob York MRN: [id number] DOB: 1936-03-28   Cancelled Treatment:    Reason Eval/Treat Not Completed: Patient's level of consciousness.  PT made two attempts to see pt, who was asleep and demonstrating some tactile defensiveness.  Will try again at another time as pt can allow.   Ramond Dial 07/13/2019, 3:52 PM   Mee Hives, PT MS Acute Rehab Dept. Number: [place] and [place]

## 2019-07-13 NOTE — ED Notes (Signed)
Pt checked for incontinent urine/stool; none detected at this time. Will reassess.

## 2019-07-13 NOTE — Consult Note (Signed)
Reason for Consult:type 2 odontoid and sah Referring Physician: edp  Jacob York is an 83 y.o. male.   HPI:  83 year old male presented to the ED last night after sustaining a fall at a nursing home.  He has a significant history of Alzheimer's, dementia, hypertension, and CVA.  Patient is unable to give his a history because of his advanced dementia.  According to witnesses he fell out of his wheelchair and struck his head.  He apparently had some seizure-like activity.  Past Medical History:  Diagnosis Date  . Alzheimer's dementia ([place])   . Hypertension   . Stroke ([place])   . TIA (transient ischemic attack)   . Urinary retention 09/22/2014    Past Surgical History:  Procedure Laterality Date  . INGUINAL HERNIA REPAIR N/A 06/20/2014   Procedure: LAPAROSCOPIC BILATERAL INGUINAL HERNIA REPAIR, LEFT FEMORAL AND INCARCERATED SUPRA UMBILICAL HERNIA REPAIR WITH MESH;  Surgeon: Michael Boston, MD;  Location: [facility];  Service: General;  Laterality: N/A;  . INSERTION OF MESH N/A 06/20/2014   Procedure: INSERTION OF MESH;  Surgeon: Michael Boston, MD;  Location: [facility];  Service: General;  Laterality: N/A;    No Known Allergies  Social History   Tobacco Use  . Smoking status: Never Smoker  . Smokeless tobacco: Never Used  Substance Use Topics  . Alcohol use: No    Family History  Problem Relation Age of Onset  . Stroke Mother   . Hypertension Mother      Review of Systems  Positive ROS:   Unable to obtain  All other systems have been reviewed and were otherwise negative with the exception of those mentioned in the HPI and as above.  Objective: Vital signs in last 24 hours: Pulse Rate:  [71-85] 71 (10/06 0734) Resp:  [13-20] 17 (10/06 0734) BP: (95-183)/(75-119) 134/87 (10/06 0734) SpO2:  [99 %-100 %] 100 % (10/06 0734)  General Appearance: Alert, cooperative, no distress, appears stated age Head: Normocephalic, without obvious abnormality, atraumatic Eyes: PERRL,  conjunctiva/corneas clear, EOM's intact, fundi benign, both eyes      Lungs:, respirations unlabored Heart: Regular rate and rhythm Pulses: 2+ and symmetric all extremities Skin: Skin color, texture, turgor normal, no rashes or lesions  NEUROLOGIC:   Mental status:  Nonverbal,  Motor Exam - grossly normal, normal tone and bulk Sensory Exam - grossly normal Reflexes:  Not tested Coordination -  Unable to test Gait -  Unable to test Balance - unable to test Cranial Nerves: I: smell Not tested  II: visual acuity  OS: na  OD: na  II: visual fields Full to confrontation  II: pupils Equal, round, reactive to light  III,VII: ptosis None  III,IV,VI: extraocular muscles    V: mastication   V: facial light touch sensation    V,VII: corneal reflex    VII: facial muscle function - upper    VII: facial muscle function - lower   VIII: hearing   IX: soft palate elevation    IX,X: gag reflex   XI: trapezius strength    XI: sternocleidomastoid strength   XI: neck flexion strength    XII: tongue strength      Data Review Lab Results  Component Value Date   WBC 7.7 07/13/2019   HGB 13.1 07/13/2019   HCT 39.3 07/13/2019   MCV 89.7 07/13/2019   PLT 190 07/13/2019   Lab Results  Component Value Date   NA 139 07/13/2019   K 3.7 07/13/2019   CL 109  07/13/2019   CO2 18 (L) 07/13/2019   BUN 24 (H) 07/13/2019   CREATININE 1.16 07/13/2019   GLUCOSE 93 07/13/2019   Lab Results  Component Value Date   INR 1.04 06/27/2016    Radiology: Ct Head Wo Contrast  Result Date: 07/12/2019 CLINICAL DATA:  Larey SeatFell out of his wheelchair. EXAM: CT HEAD WITHOUT CONTRAST CT CERVICAL SPINE WITHOUT CONTRAST TECHNIQUE: Multidetector CT imaging of the head and cervical spine was performed following the standard protocol without intravenous contrast. Multiplanar CT image reconstructions of the cervical spine were also generated. COMPARISON:  CT head and cervical spine dated May 03, 2019. FINDINGS: CT HEAD  FINDINGS Brain: New 9 mm focus of subarachnoid hemorrhage in the right sylvian fissure. No evidence of acute infarction, hydrocephalus, extra-axial collection or mass lesion/mass effect. Chronic encephalomalacia right frontal lobe. Unchanged moderate to severe atrophy and mild chronic microvascular ischemic changes. Vascular: Calcified atherosclerosis at the skullbase. No hyperdense vessel. Skull: Negative for fracture or focal lesion. Unchanged thinning of the right frontal bone. Sinuses/Orbits: No acute finding. Other: Small right frontal scalp hematoma and laceration. CT CERVICAL SPINE FINDINGS Alignment: Unchanged 2 mm anterolisthesis at C4-C5 and C5-C6. No traumatic malalignment. Skull base and vertebrae: Acute nondisplaced fracture through the base of the dens (series 14, image 32). No additional fracture. Soft tissues and spinal canal: No prevertebral fluid or swelling. No visible canal hematoma. Disc levels: Prior C6-C7 ankylosis. Similar appearing multilevel degenerative disc disease and diffuse moderate facet arthropathy. Upper chest: Negative. Other: None. IMPRESSION: 1. 9 mm focus of subarachnoid hemorrhage in the right sylvian fissure. 2. Acute nondisplaced type 2 dens fracture. 3. Small right frontal scalp hematoma and laceration. Critical Value/emergent results were called by telephone at the time of interpretation on 07/12/2019 at 8:48 pm to providerBRIAN MILLER , who verbally acknowledged these results. Electronically Signed   By: Obie DredgeWilliam T Derry M.D.   On: 07/12/2019 20:52   Ct Cervical Spine Wo Contrast  Result Date: 07/12/2019 CLINICAL DATA:  Larey SeatFell out of his wheelchair. EXAM: CT HEAD WITHOUT CONTRAST CT CERVICAL SPINE WITHOUT CONTRAST TECHNIQUE: Multidetector CT imaging of the head and cervical spine was performed following the standard protocol without intravenous contrast. Multiplanar CT image reconstructions of the cervical spine were also generated. COMPARISON:  CT head and cervical spine  dated May 03, 2019. FINDINGS: CT HEAD FINDINGS Brain: New 9 mm focus of subarachnoid hemorrhage in the right sylvian fissure. No evidence of acute infarction, hydrocephalus, extra-axial collection or mass lesion/mass effect. Chronic encephalomalacia right frontal lobe. Unchanged moderate to severe atrophy and mild chronic microvascular ischemic changes. Vascular: Calcified atherosclerosis at the skullbase. No hyperdense vessel. Skull: Negative for fracture or focal lesion. Unchanged thinning of the right frontal bone. Sinuses/Orbits: No acute finding. Other: Small right frontal scalp hematoma and laceration. CT CERVICAL SPINE FINDINGS Alignment: Unchanged 2 mm anterolisthesis at C4-C5 and C5-C6. No traumatic malalignment. Skull base and vertebrae: Acute nondisplaced fracture through the base of the dens (series 14, image 32). No additional fracture. Soft tissues and spinal canal: No prevertebral fluid or swelling. No visible canal hematoma. Disc levels: Prior C6-C7 ankylosis. Similar appearing multilevel degenerative disc disease and diffuse moderate facet arthropathy. Upper chest: Negative. Other: None. IMPRESSION: 1. 9 mm focus of subarachnoid hemorrhage in the right sylvian fissure. 2. Acute nondisplaced type 2 dens fracture. 3. Small right frontal scalp hematoma and laceration. Critical Value/emergent results were called by telephone at the time of interpretation on 07/12/2019 at 8:48 pm to providerBRIAN MILLER , who verbally  acknowledged these results. Electronically Signed   By: Obie Dredge M.D.   On: 07/12/2019 20:52     Assessment/Plan:  83 year old  DNR male with advanced dementiapresented to the ED last night after sustaining a fall out of his wheelchair. Head CT head revealed a 9 mm focal subarachnoid hemorrhage in the right sylvian fissure with no mass effect.  He  also sustained a nondisplaced type 2 odontoid fracture.  Aspen collar for  odontoid fracture.  No surgical intervention needed at  this time.  Fracture will likely heal in a collar.   Tiana Loft Tresea Heine 07/13/2019 9:02 AM

## 2019-07-13 NOTE — ED Notes (Signed)
Report given to Ala, RN on 3W

## 2019-07-13 NOTE — Progress Notes (Addendum)
[facility] PROGRESS NOTE  Jacob York UJW:[id number] DOB: [birth year] DOA: 07/12/2019 PCP: Sande Brothers, MD  Assessment/Plan: 1. Subarachnoid hemorrhage.  Presented 10/5 after a fall with head injury and generalized seizure-like activity. found to have subarachnoid hemorrhage in right Sylvia fissure on head CT . Repeat head CT with No change in a small right subarachnoid hemorrhage. Negative for hydrocephalus or other new abnormality. Evaluated by  Neurosurgery who opine no surgical intervention.  2. Type II dens fracture  Patient presented after fall from his wheelchair, found to have non-displaced type II dens fracture. Evaluated by Neurosurgery who recommended Aspen collar, no surgical intervention anticipated  -OP follow up  3. Seizure  Presented after a fall with head injury and generalized seizure-like activity. Evaluated by neurology who opine likely #1 resulted in fall/seizure. EEG with No seizures or epileptiform discharges were seen throughout the recording. Head CT with SAH as above. He was given Keppra 1 g IV in ED; no further seizure activity - continue Keppra 500 mg BID -seizure precautions    4. Dementia . May need higher level of care - Sedating medications held initially -PT/OT eval for placement -social work    5. Hypokalmeia repleted and resolved.  -monitor    Code Status: dnr Family Communication: son by phone Disposition Plan:    Consultants:  [facility]  [facility] neurosurgery NP  Procedures:  EEG  Antibiotics:    HPI/Subjective: Opens eyes to verbal stimuli. Attempts to speak.   Objective: Vitals:   07/13/19 1200 07/13/19 1235  BP:  (!) 157/92  Pulse:  61  Resp: 14 17  Temp:  97.6 F (36.4 C)  SpO2:  94%   No intake or output data in the 24 hours ending 07/13/19 1355 There were no vitals filed for this visit.  Exam:   General:  Awake alert no acute distress  Cardiovascular: rrr no mgr no LE edema  Respiratory:  normal effort BS clear bilaterally no wheeze  Abdomen: non-distended non-tender +BS   Musculoskeletal: joints without swelling/erythema  Skin: laceration forehead  Neuro. Opens eyes to verbal stimuli. Unable to speak or follow commands. MAE spontaneously. Unable to make needs know   Data Reviewed: Basic Metabolic Panel: Recent Labs  Lab 07/12/19 1843 07/13/19 0228  NA 140 139  K 3.4* 3.7  CL 108 109  CO2 20* 18*  GLUCOSE 99 93  BUN 27* 24*  CREATININE 1.18 1.16  CALCIUM 8.8* 8.8*  MG  --  1.8   Liver Function Tests: Recent Labs  Lab 07/12/19 1843  AST 18  ALT 10  ALKPHOS 59  BILITOT 0.9  PROT 5.5*  ALBUMIN 3.2*   No results for input(s): LIPASE, AMYLASE in the last 168 hours. No results for input(s): AMMONIA in the last 168 hours. CBC: Recent Labs  Lab 07/12/19 1843 07/13/19 0228  WBC 5.6 7.7  NEUTROABS 3.7 5.6  HGB 13.1 13.1  HCT 37.7* 39.3  MCV 90.6 89.7  PLT 168 190   Cardiac Enzymes: No results for input(s): CKTOTAL, CKMB, CKMBINDEX, TROPONINI in the last 168 hours. BNP (last 3 results) No results for input(s): BNP in the last 8760 hours.  ProBNP (last 3 results) No results for input(s): PROBNP in the last 8760 hours.  CBG: No results for input(s): GLUCAP in the last 168 hours.  Recent Results (from the past 240 hour(s))  SARS CORONAVIRUS 2 (TAT 6-24 HRS) Nasopharyngeal Nasopharyngeal Swab     Status: None   Collection Time: 07/12/19 11:50 PM  Specimen: Nasopharyngeal Swab  Result Value Ref Range Status   SARS Coronavirus 2 NEGATIVE NEGATIVE Final    Comment: (NOTE) SARS-CoV-2 target nucleic acids are NOT DETECTED. The SARS-CoV-2 RNA is generally detectable in upper and lower respiratory specimens during the acute phase of infection. Negative results do not preclude SARS-CoV-2 infection, do not rule out co-infections with other pathogens, and should not be used as the sole basis for treatment or other patient management  decisions. Negative results must be combined with clinical observations, patient history, and epidemiological information. The expected result is Negative. Fact Sheet for Patients: HairSlick.nohttps://www.fda.gov/media/138098/download Fact Sheet for Healthcare Providers: quierodirigir.comhttps://www.fda.gov/media/138095/download This test is not yet approved or cleared by the [place] FDA and  has been authorized for detection and/or diagnosis of SARS-CoV-2 by FDA under an Emergency Use Authorization (EUA). This EUA will remain  in effect (meaning this test can be used) for the duration of the COVID-19 declaration under Section 56 4(b)(1) of the Act, 21 U.S.C. section 360bbb-3(b)(1), unless the authorization is terminated or revoked sooner. Performed at [facility], [address] [place], [place] [address]      Studies: Ct Head Wo Contrast  Result Date: 07/13/2019 CLINICAL DATA:  The patient suffered a fall out of a wheelchair with a blow to the head and small right subarachnoid hemorrhage 07/12/2019. Initial encounter. EXAM: CT HEAD WITHOUT CONTRAST TECHNIQUE: Contiguous axial images were obtained from the base of the skull through the vertex without intravenous contrast. COMPARISON:  Head CT 07/12/2019 and 12/24/2018. FINDINGS: Brain: Small subarachnoid hemorrhage along the right Sylvian fissure is unchanged. No new hemorrhage is present. No midline shift, hydrocephalus or acute infarct. Atrophy, chronic microvascular ischemic change and remote right frontal infarct are unchanged. Vascular: No hyperdense vessel or unexpected calcification. Skull: No fracture. Thinning of the right frontal bone is unchanged. Sinuses/Orbits: Negative. Other: Scalp contusion on the right again seen. IMPRESSION: No change in a small right subarachnoid hemorrhage. Negative for hydrocephalus or other new abnormality. Atrophy, chronic microvascular ischemic change and remote right frontal infarct. Electronically Signed   By:  Drusilla Kannerhomas  Dalessio M.D.   On: 07/13/2019 13:31   Ct Head Wo Contrast  Result Date: 07/12/2019 CLINICAL DATA:  Larey SeatFell out of his wheelchair. EXAM: CT HEAD WITHOUT CONTRAST CT CERVICAL SPINE WITHOUT CONTRAST TECHNIQUE: Multidetector CT imaging of the head and cervical spine was performed following the standard protocol without intravenous contrast. Multiplanar CT image reconstructions of the cervical spine were also generated. COMPARISON:  CT head and cervical spine dated May 03, 2019. FINDINGS: CT HEAD FINDINGS Brain: New 9 mm focus of subarachnoid hemorrhage in the right sylvian fissure. No evidence of acute infarction, hydrocephalus, extra-axial collection or mass lesion/mass effect. Chronic encephalomalacia right frontal lobe. Unchanged moderate to severe atrophy and mild chronic microvascular ischemic changes. Vascular: Calcified atherosclerosis at the skullbase. No hyperdense vessel. Skull: Negative for fracture or focal lesion. Unchanged thinning of the right frontal bone. Sinuses/Orbits: No acute finding. Other: Small right frontal scalp hematoma and laceration. CT CERVICAL SPINE FINDINGS Alignment: Unchanged 2 mm anterolisthesis at C4-C5 and C5-C6. No traumatic malalignment. Skull base and vertebrae: Acute nondisplaced fracture through the base of the dens (series 14, image 32). No additional fracture. Soft tissues and spinal canal: No prevertebral fluid or swelling. No visible canal hematoma. Disc levels: Prior C6-C7 ankylosis. Similar appearing multilevel degenerative disc disease and diffuse moderate facet arthropathy. Upper chest: Negative. Other: None. IMPRESSION: 1. 9 mm focus of subarachnoid hemorrhage in the right sylvian fissure.  2. Acute nondisplaced type 2 dens fracture. 3. Small right frontal scalp hematoma and laceration. Critical Value/emergent results were called by telephone at the time of interpretation on 07/12/2019 at 8:48 pm to providerBRIAN MILLER , who verbally acknowledged these  results. Electronically Signed   By: Obie Dredge M.D.   On: 07/12/2019 20:52   Ct Cervical Spine Wo Contrast  Result Date: 07/12/2019 CLINICAL DATA:  Larey Seat out of his wheelchair. EXAM: CT HEAD WITHOUT CONTRAST CT CERVICAL SPINE WITHOUT CONTRAST TECHNIQUE: Multidetector CT imaging of the head and cervical spine was performed following the standard protocol without intravenous contrast. Multiplanar CT image reconstructions of the cervical spine were also generated. COMPARISON:  CT head and cervical spine dated May 03, 2019. FINDINGS: CT HEAD FINDINGS Brain: New 9 mm focus of subarachnoid hemorrhage in the right sylvian fissure. No evidence of acute infarction, hydrocephalus, extra-axial collection or mass lesion/mass effect. Chronic encephalomalacia right frontal lobe. Unchanged moderate to severe atrophy and mild chronic microvascular ischemic changes. Vascular: Calcified atherosclerosis at the skullbase. No hyperdense vessel. Skull: Negative for fracture or focal lesion. Unchanged thinning of the right frontal bone. Sinuses/Orbits: No acute finding. Other: Small right frontal scalp hematoma and laceration. CT CERVICAL SPINE FINDINGS Alignment: Unchanged 2 mm anterolisthesis at C4-C5 and C5-C6. No traumatic malalignment. Skull base and vertebrae: Acute nondisplaced fracture through the base of the dens (series 14, image 32). No additional fracture. Soft tissues and spinal canal: No prevertebral fluid or swelling. No visible canal hematoma. Disc levels: Prior C6-C7 ankylosis. Similar appearing multilevel degenerative disc disease and diffuse moderate facet arthropathy. Upper chest: Negative. Other: None. IMPRESSION: 1. 9 mm focus of subarachnoid hemorrhage in the right sylvian fissure. 2. Acute nondisplaced type 2 dens fracture. 3. Small right frontal scalp hematoma and laceration. Critical Value/emergent results were called by telephone at the time of interpretation on 07/12/2019 at 8:48 pm to providerBRIAN  MILLER , who verbally acknowledged these results. Electronically Signed   By: Obie Dredge M.D.   On: 07/12/2019 20:52    Scheduled Meds: . sodium chloride flush  3 mL Intravenous Q12H   Continuous Infusions: . levETIRAcetam 500 mg (07/13/19 0900)    Principal Problem:   SAH (subarachnoid hemorrhage) (HCC) Active Problems:   Seizure (HCC)   Type II dens fracture of second cervical vertebra (HCC)   Hypokalemia   Dementia with behavioral disturbance (HCC)   Alzheimer's dementia (HCC)    Time spent: 45 minutes    [facility] M NP  [facility]  If 7PM-7AM, please contact night-coverage at [email or web address], password [facility] 07/13/2019, 1:55 PM  LOS: 0 days       I have examined the patient, reviewed the chart and reviewed the above note. The patient is on outpatient hospice. Today he is quite lethargic. We will see if he wakes up to eat. If he does not, he will need to go to hospice home.  Calvert Cantor, MD

## 2019-07-13 NOTE — Progress Notes (Signed)
[facility] Liaison note.  Patient was on a pureed dysphagia diet prior to hospitalization.   Thank you, Farrel Gordon, RN, CCM [place] (listed on [place])  [phone]

## 2019-07-13 NOTE — ED Notes (Signed)
Mitts applied to patient's hands for his safety.

## 2019-07-13 NOTE — ED Notes (Signed)
Pt has 2+ radial pulses bilat and 2+ pedal pulses bilat. All 4 extremities are warm to touch and cap refill less than 3 sec.

## 2019-07-13 NOTE — Progress Notes (Signed)
Manufacturing engineer [facility]) Hospital Liaison RN note.  This is a related and covered GIP admission of 07/13/2019 with an [place] diagnosis of Alzheimer's Disease per Dr. Gildardo Cranker. Patient has an [place] DNR. EMS was activated after patient experinced a fall from his wheelchair and was noted to have generalized seizure-like activity that stopped after 2 or 3 minutes. It is not clear if the seizure precipitated the fall or came after.  Admission diagnosis of subarachnoid hemorrhage.   Spoke with bedside RN. Patient has not had any visitors and no family at bedside. Patient is confused, has picked at sutures on forehead and my need to have mitts applied. He has a cervical collar to stabilize odontoid fracture.  He is currently NPO. MD needed to know type of diet was on prior to admission. He was on pureed dysphagia diet. Phone call to son.   VS:  97.6, 157/92, 61, 17, 94% RA Abnormal Labs: CO2 18, BUN 24, Calcium 8.8, GFR 59, Abs Immature Gran 0.13  Medications: Keppra 500mg  IVPB BID, Morphine 2mg  PRN Q 4hrs @ 1101  Imaging:  CT CERVICAL SPINE FINDINGS  IMPRESSION: 1. 9 mm focus of subarachnoid hemorrhage in the right sylvian fissure. 2. Acute nondisplaced type 2 dens fracture. 3. Small right frontal scalp hematoma and laceration.  Per MD notes: Assessment/Plan:  83 year old  DNR male with advanced dementiapresented to the ED last night after sustaining a fall out of his wheelchair. Head CT head revealed a 9 mm focal subarachnoid hemorrhage in the right sylvian fissure with no mass effect.  He  also sustained a nondisplaced type 2 odontoid fracture.  Aspen collar for  odontoid fracture.  No surgical intervention needed at this time.  Fracture will likely heal in a collar.  Communication with [facility]: Spoke with son who is appreciated of the updates and our services. Son states he has not been able to visit his father since March.   Communication with [facility]: team updated.  Goals of Care: Return to  [facility] hospice care when medically stable.   Please use [facility] if  ambulance transport is required at discharge.   Please call with any hospice related questions or concerns.   Thank you, Farrel Gordon, RN, CCM [place] (listed on [place])  [phone]

## 2019-07-13 NOTE — ED Notes (Signed)
Pt was incontinent of urine. Patient cleaned/changed as well as linens.

## 2019-07-13 NOTE — Progress Notes (Signed)
Chart review note  EEG unremarkable for any electrographic abnormality.  Recommendations as in Dr. Yvetta Coder consultation note  Neurology will be available as needed    Amie Portland, MD

## 2019-07-13 NOTE — ED Notes (Signed)
Pt is NSR on monitor 

## 2019-07-13 NOTE — ED Notes (Signed)
ED TO INPATIENT HANDOFF REPORT  ED Nurse Name and Phone #: Percival Spanish [phone]  S Name/Age/Gender Jacob York 83 y.o. male Room/Bed: 021C/021C  Code Status   Code Status: DNR  Home/SNF/Other Nursing Home  Is this baseline? Yes   Triage Complete: Triage complete  Chief Complaint Seizure  Triage Note Pt came from [facility] due to a fall that he had out of his w/c while at the facility. Pt was noted to have had convulsions for a short while after his head hit the floor. He obtained a small but deep laceration above his Rt eye that has been covered with gauze my EMS before arrival to ED. Pt is at baseline- confused, with a Hx of Alzheimer's. No family at bedside, C-collar on and aligned.    Allergies No Known Allergies  Level of Care/Admitting Diagnosis ED Disposition    ED Disposition Condition Comment   Admit  Hospital Area: [facility] [[id number]]  Level of Care: Telemetry Medical [104]  I expect the patient will be discharged within 24 hours: Yes  LOW acuity---Tx typically complete <24 hrs---ACUTE conditions typically can be evaluated <24 hours---LABS likely to return to acceptable levels <24 hours---IS near functional baseline---EXPECTED to return to current living arrangement---NOT newly hypoxic: Does not meet criteria for 5C-Observation unit  Covid Evaluation: Asymptomatic Screening Protocol (No Symptoms)  Diagnosis: Subarachnoid hemorrhage (HCC) [430.ICD-9-CM]  Admitting Physician: Briscoe Deutscher [[id number]]  Attending Physician: Briscoe Deutscher [[id number]]  PT Class (Do Not Modify): Observation [104]  PT Acc Code (Do Not Modify): Observation [10022]       B Medical/Surgery History Past Medical History:  Diagnosis Date  . Alzheimer's dementia (HCC)   . Hypertension   . Stroke (HCC)   . TIA (transient ischemic attack)   . Urinary retention 09/22/2014   Past Surgical History:  Procedure Laterality Date  . INGUINAL HERNIA REPAIR N/A 06/20/2014    Procedure: LAPAROSCOPIC BILATERAL INGUINAL HERNIA REPAIR, LEFT FEMORAL AND INCARCERATED SUPRA UMBILICAL HERNIA REPAIR WITH MESH;  Surgeon: Karie Soda, MD;  Location: [facility];  Service: General;  Laterality: N/A;  . INSERTION OF MESH N/A 06/20/2014   Procedure: INSERTION OF MESH;  Surgeon: Karie Soda, MD;  Location: [facility];  Service: General;  Laterality: N/A;     A IV Location/Drains/Wounds Patient Lines/Drains/Airways Status   Active Line/Drains/Airways    Name:   Placement date:   Placement time:   Site:   Days:   Peripheral IV 07/12/19 Left Antecubital   07/12/19    1832    Antecubital   1   Wound / Incision (Open or Dehisced) 11/22/14 Other (Comment) Head Other (Comment)   11/22/14    -    Head   1694          Intake/Output Last 24 hours No intake or output data in the 24 hours ending 07/13/19 9735  Labs/Imaging Results for orders placed or performed during the hospital encounter of 07/12/19 (from the past 48 hour(s))  Valproic acid level     Status: Abnormal   Collection Time: 07/12/19  6:43 PM  Result Value Ref Range   Valproic Acid Lvl 43 (L) 50.0 - 100.0 ug/mL    Comment: Performed at [facility], [address], [place], [place] [address]  CBC with Differential/Platelet     Status: Abnormal   Collection Time: 07/12/19  6:43 PM  Result Value Ref Range   WBC 5.6 4.0 - 10.5 K/uL   RBC 4.16 (  L) 4.22 - 5.81 MIL/uL   Hemoglobin 13.1 13.0 - 17.0 g/dL   HCT 69.637.7 (L) 29.539.0 - 28.452.0 %   MCV 90.6 80.0 - 100.0 fL   MCH 31.5 26.0 - 34.0 pg   MCHC 34.7 30.0 - 36.0 g/dL   RDW 13.214.1 44.011.5 - 10.215.5 %   Platelets 168 150 - 400 K/uL   nRBC 0.0 0.0 - 0.2 %   Neutrophils Relative % 66 %   Neutro Abs 3.7 1.7 - 7.7 K/uL   Lymphocytes Relative 20 %   Lymphs Abs 1.1 0.7 - 4.0 K/uL   Monocytes Relative 10 %   Monocytes Absolute 0.6 0.1 - 1.0 K/uL   Eosinophils Relative 2 %   Eosinophils Absolute 0.1 0.0 - 0.5 K/uL   Basophils Relative 1 %   Basophils Absolute 0.0 0.0 - 0.1 K/uL    Immature Granulocytes 1 %   Abs Immature Granulocytes 0.08 (H) 0.00 - 0.07 K/uL    Comment: Performed at [facility], [address] [place], [place] [address]  Comprehensive metabolic panel     Status: Abnormal   Collection Time: 07/12/19  6:43 PM  Result Value Ref Range   Sodium 140 135 - 145 mmol/L   Potassium 3.4 (L) 3.5 - 5.1 mmol/L   Chloride 108 98 - 111 mmol/L   CO2 20 (L) 22 - 32 mmol/L   Glucose, Bld 99 70 - 99 mg/dL   BUN 27 (H) 8 - 23 mg/dL   Creatinine, Ser 6.441.18 0.61 - 1.24 mg/dL   Calcium 8.8 (L) 8.9 - 10.3 mg/dL   Total Protein 5.5 (L) 6.5 - 8.1 g/dL   Albumin 3.2 (L) 3.5 - 5.0 g/dL   AST 18 15 - 41 U/L   ALT 10 0 - 44 U/L   Alkaline Phosphatase 59 38 - 126 U/L   Total Bilirubin 0.9 0.3 - 1.2 mg/dL   GFR calc non Af Amer 58 (L) >60 mL/min   GFR calc Af Amer >60 >60 mL/min   Anion gap 12 5 - 15    Comment: Performed at [facility], [address] [place], [place] [address]  SARS CORONAVIRUS 2 (TAT 6-24 HRS) Nasopharyngeal Nasopharyngeal Swab     Status: None   Collection Time: 07/12/19 11:50 PM   Specimen: Nasopharyngeal Swab  Result Value Ref Range   SARS Coronavirus 2 NEGATIVE NEGATIVE    Comment: (NOTE) SARS-CoV-2 target nucleic acids are NOT DETECTED. The SARS-CoV-2 RNA is generally detectable in upper and lower respiratory specimens during the acute phase of infection. Negative results do not preclude SARS-CoV-2 infection, do not rule out co-infections with other pathogens, and should not be used as the sole basis for treatment or other patient management decisions. Negative results must be combined with clinical observations, patient history, and epidemiological information. The expected result is Negative. Fact Sheet for Patients: HairSlick.nohttps://www.fda.gov/media/138098/download Fact Sheet for Healthcare Providers: quierodirigir.comhttps://www.fda.gov/media/138095/download This test is not yet approved or cleared by the [place] FDA and  has been  authorized for detection and/or diagnosis of SARS-CoV-2 by FDA under an Emergency Use Authorization (EUA). This EUA will remain  in effect (meaning this test can be used) for the duration of the COVID-19 declaration under Section 56 4(b)(1) of the Act, 21 U.S.C. section 360bbb-3(b)(1), unless the authorization is terminated or revoked sooner. Performed at [facility], [address] [place], [place] [address]   Basic metabolic panel     Status: Abnormal  Collection Time: 07/13/19  2:28 AM  Result Value Ref Range   Sodium 139 135 - 145 mmol/L   Potassium 3.7 3.5 - 5.1 mmol/L   Chloride 109 98 - 111 mmol/L   CO2 18 (L) 22 - 32 mmol/L   Glucose, Bld 93 70 - 99 mg/dL   BUN 24 (H) 8 - 23 mg/dL   Creatinine, Ser 1.30 0.61 - 1.24 mg/dL   Calcium 8.8 (L) 8.9 - 10.3 mg/dL   GFR calc non Af Amer 59 (L) >60 mL/min   GFR calc Af Amer >60 >60 mL/min   Anion gap 12 5 - 15    Comment: Performed at [facility], [address] [place], [place] [address]  Magnesium     Status: None   Collection Time: 07/13/19  2:28 AM  Result Value Ref Range   Magnesium 1.8 1.7 - 2.4 mg/dL    Comment: Performed at [facility], [address], [place], [place] [address]  CBC WITH DIFFERENTIAL     Status: Abnormal   Collection Time: 07/13/19  2:28 AM  Result Value Ref Range   WBC 7.7 4.0 - 10.5 K/uL   RBC 4.38 4.22 - 5.81 MIL/uL   Hemoglobin 13.1 13.0 - 17.0 g/dL   HCT 95.2 84.1 - 32.4 %   MCV 89.7 80.0 - 100.0 fL   MCH 29.9 26.0 - 34.0 pg   MCHC 33.3 30.0 - 36.0 g/dL   RDW 40.1 02.7 - 25.3 %   Platelets 190 150 - 400 K/uL   nRBC 0.0 0.0 - 0.2 %   Neutrophils Relative % 71 %   Neutro Abs 5.6 1.7 - 7.7 K/uL   Lymphocytes Relative 15 %   Lymphs Abs 1.2 0.7 - 4.0 K/uL   Monocytes Relative 10 %   Monocytes Absolute 0.7 0.1 - 1.0 K/uL   Eosinophils Relative 1 %   Eosinophils Absolute 0.1 0.0 - 0.5 K/uL   Basophils Relative 1 %   Basophils Absolute 0.0 0.0 - 0.1 K/uL   Immature  Granulocytes 2 %   Abs Immature Granulocytes 0.13 (H) 0.00 - 0.07 K/uL    Comment: Performed at [facility], [address], [place], [place] [address]   Ct Head Wo Contrast  Result Date: 07/12/2019 CLINICAL DATA:  Larey Seat out of his wheelchair. EXAM: CT HEAD WITHOUT CONTRAST CT CERVICAL SPINE WITHOUT CONTRAST TECHNIQUE: Multidetector CT imaging of the head and cervical spine was performed following the standard protocol without intravenous contrast. Multiplanar CT image reconstructions of the cervical spine were also generated. COMPARISON:  CT head and cervical spine dated May 03, 2019. FINDINGS: CT HEAD FINDINGS Brain: New 9 mm focus of subarachnoid hemorrhage in the right sylvian fissure. No evidence of acute infarction, hydrocephalus, extra-axial collection or mass lesion/mass effect. Chronic encephalomalacia right frontal lobe. Unchanged moderate to severe atrophy and mild chronic microvascular ischemic changes. Vascular: Calcified atherosclerosis at the skullbase. No hyperdense vessel. Skull: Negative for fracture or focal lesion. Unchanged thinning of the right frontal bone. Sinuses/Orbits: No acute finding. Other: Small right frontal scalp hematoma and laceration. CT CERVICAL SPINE FINDINGS Alignment: Unchanged 2 mm anterolisthesis at C4-C5 and C5-C6. No traumatic malalignment. Skull base and vertebrae: Acute nondisplaced fracture through the base of the dens (series 14, image 32). No additional fracture. Soft tissues and spinal canal: No prevertebral fluid or swelling. No visible canal hematoma. Disc levels: Prior C6-C7 ankylosis. Similar appearing multilevel degenerative disc disease and diffuse moderate facet arthropathy.  Upper chest: Negative. Other: None. IMPRESSION: 1. 9 mm focus of subarachnoid hemorrhage in the right sylvian fissure. 2. Acute nondisplaced type 2 dens fracture. 3. Small right frontal scalp hematoma and laceration. Critical Value/emergent results were called by telephone at  the time of interpretation on 07/12/2019 at 8:48 pm to providerBRIAN MILLER , who verbally acknowledged these results. Electronically Signed   By: Titus Dubin M.D.   On: 07/12/2019 20:52   Ct Cervical Spine Wo Contrast  Result Date: 07/12/2019 CLINICAL DATA:  [place] out of his wheelchair. EXAM: CT HEAD WITHOUT CONTRAST CT CERVICAL SPINE WITHOUT CONTRAST TECHNIQUE: Multidetector CT imaging of the head and cervical spine was performed following the standard protocol without intravenous contrast. Multiplanar CT image reconstructions of the cervical spine were also generated. COMPARISON:  CT head and cervical spine dated May 03, 2019. FINDINGS: CT HEAD FINDINGS Brain: New 9 mm focus of subarachnoid hemorrhage in the right sylvian fissure. No evidence of acute infarction, hydrocephalus, extra-axial collection or mass lesion/mass effect. Chronic encephalomalacia right frontal lobe. Unchanged moderate to severe atrophy and mild chronic microvascular ischemic changes. Vascular: Calcified atherosclerosis at the skullbase. No hyperdense vessel. Skull: Negative for fracture or focal lesion. Unchanged thinning of the right frontal bone. Sinuses/Orbits: No acute finding. Other: Small right frontal scalp hematoma and laceration. CT CERVICAL SPINE FINDINGS Alignment: Unchanged 2 mm anterolisthesis at C4-C5 and C5-C6. No traumatic malalignment. Skull base and vertebrae: Acute nondisplaced fracture through the base of the dens (series 14, image 32). No additional fracture. Soft tissues and spinal canal: No prevertebral fluid or swelling. No visible canal hematoma. Disc levels: Prior C6-C7 ankylosis. Similar appearing multilevel degenerative disc disease and diffuse moderate facet arthropathy. Upper chest: Negative. Other: None. IMPRESSION: 1. 9 mm focus of subarachnoid hemorrhage in the right sylvian fissure. 2. Acute nondisplaced type 2 dens fracture. 3. Small right frontal scalp hematoma and laceration. Critical  Value/emergent results were called by telephone at the time of interpretation on 07/12/2019 at 8:48 pm to providerBRIAN MILLER , who verbally acknowledged these results. Electronically Signed   By: Titus Dubin M.D.   On: 07/12/2019 20:52    Pending Labs Unresulted Labs (From admission, onward)   None      Vitals/Pain Today's Vitals   07/13/19 0350 07/13/19 0433 07/13/19 0600 07/13/19 0734  BP:  (!) 129/94 95/75 134/87  Pulse:  76 79 71  Resp:  16 20 17   SpO2:  100% 100% 100%  PainSc: Asleep       Isolation Precautions No active isolations  Medications Medications  levETIRAcetam (KEPPRA) IVPB 500 mg/100 mL premix (has no administration in time range)  sodium chloride flush (NS) 0.9 % injection 3 mL (3 mLs Intravenous Not Given 07/12/19 2231)  0.9 % NaCl with KCl 40 mEq / L  infusion (90 mL/hr Intravenous New Bag/Given 07/12/19 2349)  acetaminophen (TYLENOL) tablet 650 mg (has no administration in time range)    Or  acetaminophen (TYLENOL) suppository 650 mg (has no administration in time range)  ondansetron (ZOFRAN) tablet 4 mg (has no administration in time range)    Or  ondansetron (ZOFRAN) injection 4 mg (has no administration in time range)  labetalol (NORMODYNE) injection 10 mg (has no administration in time range)  morphine 2 MG/ML injection 1-2 mg (has no administration in time range)  ondansetron (ZOFRAN) injection 4 mg (has no administration in time range)  LORazepam (ATIVAN) injection 1 mg (1 mg Intravenous Given 07/12/19 2154)  lidocaine-EPINEPHrine (XYLOCAINE W/EPI) 2 %-1:200000 (PF) injection (  20 mLs  Given 07/12/19 1844)  levETIRAcetam (KEPPRA) IVPB 1000 mg/100 mL premix (0 mg Intravenous Stopped 07/12/19 2155)  lidocaine-EPINEPHrine (XYLOCAINE W/EPI) 2 %-1:200000 (PF) injection (10 mLs Transdermal Given 07/12/19 2230)    Mobility non-ambulatory High fall risk   Focused Assessments Neuro Assessment Handoff:  Swallow screen pass? No  Cardiac Rhythm: Normal  sinus rhythm       Neuro Assessment: Exceptions to WDL Neuro Checks:      Last Documented NIHSS Modified Score:   Has TPA been given? No If patient is a Neuro Trauma and patient is going to OR before floor call report to 4N Charge nurse: [phone] or [phone]     R Recommendations: See Admitting Provider Note  Report given to:   Additional Notes:

## 2019-07-13 NOTE — Procedures (Signed)
Patient Name: Jacob York  MRN: [id number]  Epilepsy Attending: Lora Havens  Referring Physician/Provider: Dr. Mitzi Hansen Date: 07/12/2019 Duration: 23.20 minutes  Patient history: 83 year old male with seizure-like activity.  EEG to evaluate for seizure.  Level of alertness: Awake/confused  AEDs during EEG study: Keppra  Technical aspects: This EEG study was done with scalp electrodes positioned according to the 10-20 International system of electrode placement. Electrical activity was acquired at a sampling rate of 500Hz  and reviewed with a high frequency filter of 70Hz  and a low frequency filter of 1Hz . EEG data were recorded continuously and digitally stored.   Description: EEG showed continuous generalized 2 to 6 Hz theta with delta slowing.  No clear posterior dominant background rhythm is seen.  Photic stimulation and hyperventilation were not performed.  Of note, EEG was technically difficult due to significant myogenic artifact in a confused patient.  Abnormality -Continued slow, generalized  IMPRESSION: This technically difficult study is suggestive of moderate diffuse encephalopathy, nonspecific to etiology. No seizures or epileptiform discharges were seen throughout the recording.  Jacob York

## 2019-07-14 LAB — GLUCOSE, CAPILLARY: Glucose-Capillary: 85 mg/dL (ref 70–99)

## 2019-07-14 MED ORDER — CHLORHEXIDINE GLUCONATE 0.12 % MT SOLN
15.0000 mL | Freq: Two times a day (BID) | OROMUCOSAL | Status: DC
  Administered 2019-07-14 – 2019-07-15 (×3): 15 mL via OROMUCOSAL
  Filled 2019-07-14 (×2): qty 15

## 2019-07-14 MED ORDER — CHLORHEXIDINE GLUCONATE 0.12 % MT SOLN
OROMUCOSAL | Status: AC
  Filled 2019-07-14: qty 15

## 2019-07-14 NOTE — Progress Notes (Signed)
OT Cancellation Note  Patient Details Name: Jacob York MRN: [id number] DOB: 03-Sep-1936   Cancelled Treatment:    Reason Eval/Treat Not Completed: OT screened, no needs identified, will defer at this time. Pt from SNF and returning to SNF with hospice. Please alert OT if evaluation is needed for d/c to SNF.  Dorinda Hill OTR/L Acute Rehabilitation Services Office: [place] 07/14/2019, 1:29 PM

## 2019-07-14 NOTE — Discharge Summary (Addendum)
Physician Discharge Summary  JOHANNA STAFFORD ZOX:[id number] DOB: 05-Nov-1935 DOA: 07/12/2019  PCP: Ron Parker, MD  Admit date: 07/12/2019 Discharge date: 07/15/2019  Admitted From: 07/12/19 Disposition:  07/15/19      Recommendations for Outpatient Follow-up:  1. Follow up [facility] 2. Continue PO medications as patient is able  3. Keep soft cervical collar in place.  No pillows under neck with cervical collar / injury       Home Health: Hospice Home Equipment/Devices: wheelchair  Discharge Condition: Fair CODE STATUS: DNR  Diet recommendation: Diet as tolerated / comfort feeding, pureed prior to admit.  Brief/Interim Summary: Mr. Tubbs is an 83 y.o. M with advanced dementia, previously on Hospice for same, as well as previous stroke and HTN who was admitted on 10/5 from a skilled nursing facility after a fall out of his wheelchair striking his head on the ground followed by 2-3 minute GTC seizure.    In the ER, noted to have laceration to the right forehead.  CT of the head demonstrated a 9mm subarachnoid hemorrhage in the right sylvian fissure, small right frontal scalp hematoma.  Cervical spine CT revealed an acute non-displaced type II dens fracture.  The patient was admitted per [facility] for further evaluation.        PRINCIPAL HOSPITAL DIAGNOSIS: Subarachnoid hemorrhage resulting in seizure and dens fracture    Discharge Diagnoses:   Acute Subarachnoid Hemorrhage  Neurosurgery evaluated the patient and recommended surgical intervention would be unlikely to improve symptoms or outcome, but high risk.  Additionally, he was evaluated by Neurology and recommended keppra for seizure. Spot EEG 10/6 was negative for seizure / epileptiform discharge.    After the [facility], the patient had decreased sensorium and decreased function.  He had no oral intake at all for 24 hours.  Goals of care were discussed with family, who felt the patient would want no invasive  treatments, would want palliative attempts at comfort, comfort feedings, and natural evolution without prolonging life by artificial means.        Acute Non-displaced Type II Dens Fracture  Placed in hard [place] collar initially by Neurosurgery, recommending 6 weeks immobilization.  Discussed with them, given goals of care, soft collar (or no collar) are reasonable alternatives, as this is not an unstable fracture.  Seizure  Started on Keppra. Reasonable to continue in liquid form if tolerates PO. Continue Depakote.  Adult Failure to Thrive  Alzheimer's Disease  HTN           Discharge Instructions    Increase activity slowly   Complete by: As directed      Allergies as of 07/15/2019   No Known Allergies     Medication List    STOP taking these medications   aspirin 81 MG chewable tablet     TAKE these medications   acetaminophen 650 MG suppository Commonly known as: TYLENOL Place 1 suppository (650 mg total) rectally every 6 (six) hours as needed for mild pain (or Fever >/= 101).   cetirizine 10 MG tablet Commonly known as: ZYRTEC Take 10 mg by mouth daily as needed (itching).   cholecalciferol 1000 units tablet Commonly known as: VITAMIN D Take 1,000 Units by mouth daily with breakfast.   divalproex 125 MG capsule Commonly known as: DEPAKOTE SPRINKLE Take 250-500 mg by mouth daily. Takes 250 mg tablet by mouth in the morning and then take 500 mg tablet by mouth at night. May mix in applesauce or pudding   levETIRAcetam  100 MG/ML solution Commonly known as: KEPPRA Take 5 mLs (500 mg total) by mouth 2 (two) times daily.   LORazepam 0.5 MG tablet Commonly known as: ATIVAN Take 1 tablet (0.5 mg total) by mouth daily.   losartan 25 MG tablet Commonly known as: COZAAR Take 25 mg by mouth daily.   Mintox 200-200-20 MG/5ML suspension Generic drug: alum & mag hydroxide-simeth Take 30 mLs by mouth as needed for indigestion or heartburn.   mirtazapine 30  MG tablet Commonly known as: REMERON Take 30 mg by mouth at bedtime.   morphine 20 MG/5ML solution Take 1.3 mLs (5.2 mg total) by mouth every 3 (three) hours as needed for pain.   QUEtiapine 100 MG tablet Commonly known as: SEROQUEL Take 50-100 mg by mouth See admin instructions. Take 100mg  tablet by mouth at bedtime, then take 50mg  tablet by mouth in the morning   tamsulosin 0.4 MG Caps capsule Commonly known as: FLOMAX Take 1 capsule (0.4 mg total) by mouth 2 (two) times daily.   traZODone 50 MG tablet Commonly known as: DESYREL Take 50 mg by mouth at bedtime.       No Known Allergies  Consultations:  Neurology    Procedures/Studies: Ct Head Wo Contrast  Result Date: 07/13/2019 CLINICAL DATA:  The patient suffered a fall out of a wheelchair with a blow to the head and small right subarachnoid hemorrhage 07/12/2019. Initial encounter. EXAM: CT HEAD WITHOUT CONTRAST TECHNIQUE: Contiguous axial images were obtained from the base of the skull through the vertex without intravenous contrast. COMPARISON:  Head CT 07/12/2019 and 12/24/2018. FINDINGS: Brain: Small subarachnoid hemorrhage along the right Sylvian fissure is unchanged. No new hemorrhage is present. No midline shift, hydrocephalus or acute infarct. Atrophy, chronic microvascular ischemic change and remote right frontal infarct are unchanged. Vascular: No hyperdense vessel or unexpected calcification. Skull: No fracture. Thinning of the right frontal bone is unchanged. Sinuses/Orbits: Negative. Other: Scalp contusion on the right again seen. IMPRESSION: No change in a small right subarachnoid hemorrhage. Negative for hydrocephalus or other new abnormality. Atrophy, chronic microvascular ischemic change and remote right frontal infarct. Electronically Signed   By: Drusilla Kannerhomas  Dalessio M.D.   On: 07/13/2019 13:31   Ct Head Wo Contrast  Result Date: 07/12/2019 CLINICAL DATA:  Larey SeatFell out of his wheelchair. EXAM: CT HEAD WITHOUT  CONTRAST CT CERVICAL SPINE WITHOUT CONTRAST TECHNIQUE: Multidetector CT imaging of the head and cervical spine was performed following the standard protocol without intravenous contrast. Multiplanar CT image reconstructions of the cervical spine were also generated. COMPARISON:  CT head and cervical spine dated May 03, 2019. FINDINGS: CT HEAD FINDINGS Brain: New 9 mm focus of subarachnoid hemorrhage in the right sylvian fissure. No evidence of acute infarction, hydrocephalus, extra-axial collection or mass lesion/mass effect. Chronic encephalomalacia right frontal lobe. Unchanged moderate to severe atrophy and mild chronic microvascular ischemic changes. Vascular: Calcified atherosclerosis at the skullbase. No hyperdense vessel. Skull: Negative for fracture or focal lesion. Unchanged thinning of the right frontal bone. Sinuses/Orbits: No acute finding. Other: Small right frontal scalp hematoma and laceration. CT CERVICAL SPINE FINDINGS Alignment: Unchanged 2 mm anterolisthesis at C4-C5 and C5-C6. No traumatic malalignment. Skull base and vertebrae: Acute nondisplaced fracture through the base of the dens (series 14, image 32). No additional fracture. Soft tissues and spinal canal: No prevertebral fluid or swelling. No visible canal hematoma. Disc levels: Prior C6-C7 ankylosis. Similar appearing multilevel degenerative disc disease and diffuse moderate facet arthropathy. Upper chest: Negative. Other: None. IMPRESSION: 1. 9  mm focus of subarachnoid hemorrhage in the right sylvian fissure. 2. Acute nondisplaced type 2 dens fracture. 3. Small right frontal scalp hematoma and laceration. Critical Value/emergent results were called by telephone at the time of interpretation on 07/12/2019 at 8:48 pm to providerBRIAN MILLER , who verbally acknowledged these results. Electronically Signed   By: Obie Dredge M.D.   On: 07/12/2019 20:52   Ct Cervical Spine Wo Contrast  Result Date: 07/12/2019 CLINICAL DATA:  Larey Seat out of  his wheelchair. EXAM: CT HEAD WITHOUT CONTRAST CT CERVICAL SPINE WITHOUT CONTRAST TECHNIQUE: Multidetector CT imaging of the head and cervical spine was performed following the standard protocol without intravenous contrast. Multiplanar CT image reconstructions of the cervical spine were also generated. COMPARISON:  CT head and cervical spine dated May 03, 2019. FINDINGS: CT HEAD FINDINGS Brain: New 9 mm focus of subarachnoid hemorrhage in the right sylvian fissure. No evidence of acute infarction, hydrocephalus, extra-axial collection or mass lesion/mass effect. Chronic encephalomalacia right frontal lobe. Unchanged moderate to severe atrophy and mild chronic microvascular ischemic changes. Vascular: Calcified atherosclerosis at the skullbase. No hyperdense vessel. Skull: Negative for fracture or focal lesion. Unchanged thinning of the right frontal bone. Sinuses/Orbits: No acute finding. Other: Small right frontal scalp hematoma and laceration. CT CERVICAL SPINE FINDINGS Alignment: Unchanged 2 mm anterolisthesis at C4-C5 and C5-C6. No traumatic malalignment. Skull base and vertebrae: Acute nondisplaced fracture through the base of the dens (series 14, image 32). No additional fracture. Soft tissues and spinal canal: No prevertebral fluid or swelling. No visible canal hematoma. Disc levels: Prior C6-C7 ankylosis. Similar appearing multilevel degenerative disc disease and diffuse moderate facet arthropathy. Upper chest: Negative. Other: None. IMPRESSION: 1. 9 mm focus of subarachnoid hemorrhage in the right sylvian fissure. 2. Acute nondisplaced type 2 dens fracture. 3. Small right frontal scalp hematoma and laceration. Critical Value/emergent results were called by telephone at the time of interpretation on 07/12/2019 at 8:48 pm to providerBRIAN MILLER , who verbally acknowledged these results. Electronically Signed   By: Obie Dredge M.D.   On: 07/12/2019 20:52    Subjective: No acute events overnight.       Discharge Exam: Vitals:   07/15/19 0426 07/15/19 0746  BP: 126/71 133/75  Pulse: 70 68  Resp: 16 18  Temp: 98.6 F (37 C) 98 F (36.7 C)  SpO2: 95% 100%   Vitals:   07/14/19 1911 07/14/19 2341 07/15/19 0426 07/15/19 0746  BP: 128/73 133/70 126/71 133/75  Pulse: 85 78 70 68  Resp: 16 17 16 18   Temp: 98.4 F (36.9 C) 99.4 F (37.4 C) 98.6 F (37 C) 98 F (36.7 C)  TempSrc: Oral Oral Oral Oral  SpO2: 99% 99% 95% 100%    General: ill appearing adult male lying in bed in no acute distress, [place] cervical collar in place  Cardiovascular: RRR, nl S1-S2, no murmurs appreciated.   No LE edema.  SCD's in place Respiratory: Normal respiratory rate and rhythm.  CTAB without rales or wheezes. Abdominal: Abdomen soft and non-tender.  No distension or HSM.   Neuro/Psych: opens eyes to voice, does not follow commands but does move spontaneously    The results of significant diagnostics from this hospitalization (including imaging, microbiology, ancillary and laboratory) are listed below for reference.     Microbiology: Recent Results (from the past 240 hour(s))  SARS CORONAVIRUS 2 (TAT 6-24 HRS) Nasopharyngeal Nasopharyngeal Swab     Status: None   Collection Time: 07/12/19 11:50 PM   Specimen: Nasopharyngeal  Swab  Result Value Ref Range Status   SARS Coronavirus 2 NEGATIVE NEGATIVE Final    Comment: (NOTE) SARS-CoV-2 target nucleic acids are NOT DETECTED. The SARS-CoV-2 RNA is generally detectable in upper and lower respiratory specimens during the acute phase of infection. Negative results do not preclude SARS-CoV-2 infection, do not rule out co-infections with other pathogens, and should not be used as the sole basis for treatment or other patient management decisions. Negative results must be combined with clinical observations, patient history, and epidemiological information. The expected result is Negative. Fact Sheet for  Patients: HairSlick.no Fact Sheet for Healthcare Providers: quierodirigir.com This test is not yet approved or cleared by the [place] FDA and  has been authorized for detection and/or diagnosis of SARS-CoV-2 by FDA under an Emergency Use Authorization (EUA). This EUA will remain  in effect (meaning this test can be used) for the duration of the COVID-19 declaration under Section 56 4(b)(1) of the Act, 21 U.S.C. section 360bbb-3(b)(1), unless the authorization is terminated or revoked sooner. Performed at [facility], [address], [place], [place] [address]   MRSA PCR Screening     Status: None   Collection Time: 07/13/19 12:32 PM   Specimen: Nasal Mucosa; Nasopharyngeal  Result Value Ref Range Status   MRSA by PCR NEGATIVE NEGATIVE Final    Comment:        The GeneXpert MRSA Assay (FDA approved for NASAL specimens only), is one component of a comprehensive MRSA colonization surveillance program. It is not intended to diagnose MRSA infection nor to guide or monitor treatment for MRSA infections. Performed at [facility], [address] [place], [place] [address]      Labs: BNP (last 3 results) No results for input(s): BNP in the last 8760 hours.   Basic Metabolic Panel: Recent Labs  Lab 07/12/19 1843 07/13/19 0228  NA 140 139  K 3.4* 3.7  CL 108 109  CO2 20* 18*  GLUCOSE 99 93  BUN 27* 24*  CREATININE 1.18 1.16  CALCIUM 8.8* 8.8*  MG  --  1.8   Liver Function Tests: Recent Labs  Lab 07/12/19 1843  AST 18  ALT 10  ALKPHOS 59  BILITOT 0.9  PROT 5.5*  ALBUMIN 3.2*   No results for input(s): LIPASE, AMYLASE in the last 168 hours. No results for input(s): AMMONIA in the last 168 hours. CBC: Recent Labs  Lab 07/12/19 1843 07/13/19 0228  WBC 5.6 7.7  NEUTROABS 3.7 5.6  HGB 13.1 13.1  HCT 37.7* 39.3  MCV 90.6 89.7  PLT 168 190   Cardiac Enzymes: No results for input(s):  CKTOTAL, CKMB, CKMBINDEX, TROPONINI in the last 168 hours. BNP: Invalid input(s): POCBNP CBG: Recent Labs  Lab 07/14/19 0604 07/15/19 0630  GLUCAP 85 93   D-Dimer No results for input(s): DDIMER in the last 72 hours.   Hgb A1c No results for input(s): HGBA1C in the last 72 hours.   Lipid Profile No results for input(s): CHOL, HDL, LDLCALC, TRIG, CHOLHDL, LDLDIRECT in the last 72 hours.   Thyroid function studies No results for input(s): TSH, T4TOTAL, T3FREE, THYROIDAB in the last 72 hours.  Invalid input(s): FREET3   Anemia work up No results for input(s): VITAMINB12, FOLATE, FERRITIN, TIBC, IRON, RETICCTPCT in the last 72 hours.   Urinalysis    Component Value Date/Time   COLORURINE YELLOW 04/30/2018 1414   APPEARANCEUR CLEAR 04/30/2018 1414   LABSPEC 1.010 04/30/2018 1414   PHURINE 9.0 (H)  04/30/2018 1414   GLUCOSEU NEGATIVE 04/30/2018 1414   HGBUR NEGATIVE 04/30/2018 1414   BILIRUBINUR NEGATIVE 04/30/2018 1414   KETONESUR NEGATIVE 04/30/2018 1414   PROTEINUR NEGATIVE 04/30/2018 1414   UROBILINOGEN 0.2 07/31/2015 0118   NITRITE NEGATIVE 04/30/2018 1414   LEUKOCYTESUR NEGATIVE 04/30/2018 1414   Sepsis Labs Invalid input(s): PROCALCITONIN,  WBC,  LACTICIDVEN   Microbiology Recent Results (from the past 240 hour(s))  SARS CORONAVIRUS 2 (TAT 6-24 HRS) Nasopharyngeal Nasopharyngeal Swab     Status: None   Collection Time: 07/12/19 11:50 PM   Specimen: Nasopharyngeal Swab  Result Value Ref Range Status   SARS Coronavirus 2 NEGATIVE NEGATIVE Final    Comment: (NOTE) SARS-CoV-2 target nucleic acids are NOT DETECTED. The SARS-CoV-2 RNA is generally detectable in upper and lower respiratory specimens during the acute phase of infection. Negative results do not preclude SARS-CoV-2 infection, do not rule out co-infections with other pathogens, and should not be used as the sole basis for treatment or other patient management decisions. Negative results must be  combined with clinical observations, patient history, and epidemiological information. The expected result is Negative. Fact Sheet for Patients: SugarRoll.be Fact Sheet for Healthcare Providers: https://www.woods-mathews.com/ This test is not yet approved or cleared by the [place] FDA and  has been authorized for detection and/or diagnosis of SARS-CoV-2 by FDA under an Emergency Use Authorization (EUA). This EUA will remain  in effect (meaning this test can be used) for the duration of the COVID-19 declaration under Section 56 4(b)(1) of the Act, 21 U.S.C. section 360bbb-3(b)(1), unless the authorization is terminated or revoked sooner. Performed at [facility], [place] [address] [place], [place] [address]   MRSA PCR Screening     Status: None   Collection Time: 07/13/19 12:32 PM   Specimen: Nasal Mucosa; Nasopharyngeal  Result Value Ref Range Status   MRSA by PCR NEGATIVE NEGATIVE Final    Comment:        The GeneXpert MRSA Assay (FDA approved for NASAL specimens only), is one component of a comprehensive MRSA colonization surveillance program. It is not intended to diagnose MRSA infection nor to guide or monitor treatment for MRSA infections. Performed at [facility], [place] [address], [place], [place] [address]      Time coordinating discharge: 30 minutes    SIGNED:  Noe Gens, [place]      [facility] 07/15/2019, 12:18 PM      Attending MD Note:  I have seen and examined the patient with nurse practitioner/physician assistant and agree with the note above which has been edited to reflect our agreed upon history, exam, and assessment/plan.   I have personally reviewed the orders for the patient, which were made under my direction.    DREVION OFFORD is a 83 y.o. male with advanced dementia on Hospice who presents with fall and SAH and seizure and dens fracture.   This head trauma and bleed have caused further decline to his already very limited functional status. Family confirmed that they do not wish for life prolongation by artificial means, but rather comfort, dignity and to allow a natural death.   I suspect that he will not be able to maintain adequate oral nutrition and fluid intake and has days to weeks prognosis.    [place]

## 2019-07-14 NOTE — TOC Initial Note (Signed)
Transition of Care [facility]) - Initial/Assessment Note    Patient Details  Name: Jacob York MRN: [id number] Date of Birth: Aug 27, 1936  [facility] CM/SW Contact:    Geralynn Ochs, LCSW Phone Number: 07/14/2019, 4:37 PM  Clinical Narrative:   This morning, CSW alerted by MD that patient could be ready to return to facility. CSW reached out to [facility] to discuss patient's return, started [facility] note. CSW later contacted by MD that the son would like him moved into residential hospice, preference for [facility] in [place] as he lives out there. CSW contacted son, Elenore Rota, and spoke with him over the phone. Elenore Rota confirmed interest in [facility] [facility] home, and CSW discussed referral process and that someone from the hospice home would be calling him. Elenore Rota indicated understanding, appreciative of CSW assistance. CSW called [facility] and gave referral to [place]. Cheri called CSW back later to indicate that there are no beds available today but she will continue to follow. CSW to follow.                Expected Discharge Plan: [place] Barriers to Discharge: Hospice Bed not available   Patient Goals and CMS Choice Patient states their goals for this hospitalization and ongoing recovery are:: son's goal is to keep patient comfortable and get him closer to where he is in [facility].gov Compare Post Acute Care list provided to:: Patient Represenative (must comment) Choice offered to / list presented to : Adult Children  Expected Discharge Plan and Services Expected Discharge Plan: [place] Choice: Hospice Living arrangements for the past 2 months: [place]                                      Prior Living Arrangements/Services Living arrangements for the past 2 months: [place] Lives with:: Facility Resident Patient language and  need for interpreter reviewed:: No Do you feel safe going back to the place where you live?: Yes      Need for Family Participation in Patient Care: Yes (Comment) Care giver support system in place?: Yes (comment) Current home services: Hospice Criminal Activity/Legal Involvement Pertinent to Current Situation/Hospitalization: No - Comment as needed  Activities of Daily Living      Permission Sought/Granted Permission sought to share information with : Facility Sport and exercise psychologist, Family Supports Permission granted to share information with : Yes, Verbal Permission Granted  Share Information with NAME: Elenore Rota  Permission granted to share info w AGENCY: [facility]  Permission granted to share info w Relationship: Son     Emotional Assessment Appearance:: Appears stated age Attitude/Demeanor/Rapport: Unable to Assess Affect (typically observed): Unable to Assess   Alcohol / Substance Use: Not Applicable Psych Involvement: No (comment)  Admission diagnosis:  Seizure ([place]) [R56.9] SAH (subarachnoid hemorrhage) ([place]) [I60.9] Facial laceration, initial encounter [S01.81XA] Closed odontoid fracture, initial encounter [facility]) [S12.100A] Patient Active Problem List   Diagnosis Date Noted  . Subarachnoid hemorrhage ([place]) 07/13/2019  . Closed fracture of odontoid process of axis ([place])   . Facial laceration   . SAH (subarachnoid hemorrhage) ([place]) 07/12/2019  . Alzheimer's dementia ([place])   . Seizure ([place])   . Type II dens fracture of second cervical vertebra ([place])   . Hypokalemia   . Agitation 12/02/2015  .  UTI (lower urinary tract infection) 03/03/2015  . Pre-syncope 10/15/2014  . H/O urinary retention 09/22/2014  . Bigeminy 09/21/2014  . Syncope 09/21/2014  . Bradycardia 09/21/2014  . Chronic renal insufficiency, stage III (moderate) 09/21/2014  . Ventral hernia 06/20/2014  . Bilateral inguinal hernia (BIH) L>>R 05/24/2014  . Incarcerated ventral hernia - 4cm  supraumbilical 05/24/2014  . Diastasis recti 05/24/2014  . Memory loss 05/24/2014  . Dementia with behavioral disturbance (HCC)   . Hypertension    PCP:  Ron Parker, MD Pharmacy:   [facility] - [place], [place] - [address], [address] [place] [address] Phone: [phone] Fax: [phone]     Social Determinants of Health (SDOH) Interventions    Readmission Risk Interventions No flowsheet data found.

## 2019-07-14 NOTE — Progress Notes (Signed)
[place];  [place].  Spoke with Bevely Palmer who states that if we find pt appropriate for our inpt facility they will do a transfer of care to [place].  TC to the son X 2 with no answer. Will need to discuss with him and have him come sign paperwork for pt to transfer to [place].   TC to Kathlee Nations to let her know we do not have a bed to offer today. We will follow up in am to confirm bed availability and hopefully be able to proceed with discharge plan for [facility].  Azzan Butler RN [phone]

## 2019-07-14 NOTE — Progress Notes (Signed)
Manufacturing engineer [facility]) Hospital Liaison RN note.  This is a related and covered GIP admission of 07/13/2019 with an [place] diagnosis of Alzheimer's Disease per Dr. Gildardo Cranker. Patient has an [place] DNR. EMS was activated after patient experinced a fall from his wheelchair and was noted to have generalized seizure-like activity that stopped after 2 or 3 minutes. It is not clear if the seizure precipitated the fall or came after.  Admission diagnosis of subarachnoid hemorrhage.   Spoke with son to discuss future care plans and clarify where he wants the patient to go. Patient is eating very little and is anticipated to decline.   VS:  97.7, 124/75, 78, 17, 100% RA I/O: 100/400 No new labs or imaging.   Medications: Keppra 500mg  IVPB BID  Per MD notes: The patient was admitted per [facility] for further evaluation.  Neurosurgery evaluated the patient and recommended a cervical collar but no surgical interventions.  Additionally, he was evaluated by Neurology and recommended keppra for seizure. EEG was negative for seizure / epileptiform discharge.  The patient has had PO limited intake since admission with altered mental status.  Further discussion with family on 10/7 (son, Elenore Rota) regarding goals of care and he indicates he would be comfortable with transition to hospice care.    Communication with PCG: Spoke with son who is appreciated of the updates and our services. Son states he would prefer [place] due to close to home location.   Communication with IDG: team updated. Patient family has requested he be evaluated for residential hospice at [place] due to location.  Goals of Care: to be determined. He may transfer to [place].   Please call with any hospice related questions or concerns.   Thank you, Farrel Gordon, RN, CCM [place] (listed on [place])  [phone]

## 2019-07-14 NOTE — Progress Notes (Addendum)
PROGRESS NOTE    WIRT HEMMERICH  LEX:[id number] DOB: Feb 23, 1936 DOA: 07/12/2019 PCP: Ron Parker, MD    Brief Narrative:  83 y/o M admitted on 10/5 from a skilled nursing facility after a fall out of his wheelchair striking his head on the ground.  The patient had a laceration to the right forehead.  Post fall he had approximately 2-3 minutes of tonic-clonic activity which stopped spontaneously.  At baseline, he is DNR.  CT of the head demonstrated a 62mm subarachnoid hemorrhage in the right sylvian fissure, small right frontal scalp hematoma.  Cervical spine CT revealed an acute non-displaced type II dens fracture.  The patient was admitted per [facility] for further evaluation.  Neurosurgery evaluated the patient and recommended a cervical collar but no surgical interventions.  Additionally, he was evaluated by Neurology and recommended keppra for seizure. EEG was negative for seizure / epileptiform discharge.  The patient has had PO limited intake since admission with altered mental status.  Further discussion with family on 10/7 (son, Dorinda Hill) regarding goals of care and he indicates he would be comfortable with transition to hospice care.     Assessment & Plan:  Subarachnoid Hemorrhage  -s/p fall out of wheelchair P: Appreciate NSGY, no surgical recommendations at this time  Frequent neuro exams  Type II Dens Fracture P: Continue cervical collar / [place] > will review if necessary with NSGY if pt transitions to full comfort Consider outpatient follow up pending patients course   Seizure  -suspect in setting of traumatic SAH P: Continue Keppra IV for now  Will transition to keppra liquid at discharge with 1:1 conversion Seizure precautions   Dementia  P: Supportive care No new home health needs identified per PT given hospice transition  At Risk Aspiration  P: Aspiration precautions  Allow comfort feeding if patient requests  Goals of Care P: Discussed plan of care with son  Dorinda Hill) via phone on 10/7.  We reviewed the reasons for current admission, findings and work up thus far. We reviewed that Mr. Teschner has had limited meaningful interaction with staff and limited PO intake.  We discussed anticipated decline with lack of intake. Son wants to speak with his sister but believes that they would both be comfortable with transitioning him to a hospice home.  He states "there is nothing I can do to change the situation and I am concerned he will need more care than [facility] can provide at this point".  Likely, if he is not eating/drinking will be days to week. Reviewed with SW to assess for potential discharge to hospice home in [facility]. Will follow up with family regarding disposition.     DVT prophylaxis: SCD's  Code Status: DNR  Family Communication: Family updated via phone 10/7  Medical decision making The above labs and imaging reports reviewed and summarized.  Medication management as above.  The patient was admitted with subarachnoid hemorrhage, dens fracture.  In the last 24 hours he has had no oral intake, and his cognition is depressed.  Suspect the patient is failing to thrive.  Discussed hospice with family, who concur.  We will make arrangements for hospice in the next 24 hours.   Consultants:   Neurology   Neurosurgery   Procedures:     Antimicrobials:       Subjective: RN reports limited oral intake, no meaningful interaction with staff.  Afebrile.    Objective: Vitals:   07/13/19 2038 07/13/19 2344 07/14/19 0400 07/14/19 0756  BP: Marland Kitchen)  147/86 139/70 (!) 144/73 128/79  Pulse: 78 68 65 84  Resp: Temp: 98.2 F (36.8 C) 98 F (36.7 C) 97.7 F (36.5 C) 98.3 F (36.8 C)  TempSrc: Oral Oral Axillary Axillary  SpO2:   97% 98%    Intake/Output Summary (Last 24 hours) at 07/14/2019 1152 Last data filed at 07/13/2019 1700 Gross per 24 hour  Intake 100 ml  Output 400 ml  Net -300 ml   There were no vitals filed for  this visit.  Examination: General appearance: frail elderly adult male, alert and in no acute distress.   HEENT: Anicteric, conjunctiva pink, lids and lashes normal. No nasal deformity, discharge, epistaxis.  Lips dry. C-collar in place    Skin: Warm and dry.  no jaundice.  No suspicious rashes or lesions. Cardiac: RRR, nl S1-S2, no murmurs appreciated.  Capillary refill wnl.  Unable to assess JVP.  No LE edema.  Radial pulses 2+ and symmetric. Respiratory: Normal respiratory rate and rhythm.  CTAB without rales or wheezes. Abdomen: Abdomen soft.   No ascites, distension   MSK: No deformities or effusions. Neuro: eyes closed, pupils 55mm=/reactive, moves spontaneously but not to command     Psych: unable to assess     Data Reviewed: I have personally reviewed following labs and imaging studies:  CBC: Recent Labs  Lab 07/12/19 1843 07/13/19 0228  WBC 5.6 7.7  NEUTROABS 3.7 5.6  HGB 13.1 13.1  HCT 37.7* 39.3  MCV 90.6 89.7  PLT 168 190   Basic Metabolic Panel: Recent Labs  Lab 07/12/19 1843 07/13/19 0228  NA 140 139  K 3.4* 3.7  CL 108 109  CO2 20* 18*  GLUCOSE 99 93  BUN 27* 24*  CREATININE 1.18 1.16  CALCIUM 8.8* 8.8*  MG  --  1.8   GFR: CrCl cannot be calculated (Unknown ideal weight.). Liver Function Tests: Recent Labs  Lab 07/12/19 1843  AST 18  ALT 10  ALKPHOS 59  BILITOT 0.9  PROT 5.5*  ALBUMIN 3.2*   No results for input(s): LIPASE, AMYLASE in the last 168 hours. No results for input(s): AMMONIA in the last 168 hours. Coagulation Profile: No results for input(s): INR, PROTIME in the last 168 hours. Cardiac Enzymes: No results for input(s): CKTOTAL, CKMB, CKMBINDEX, TROPONINI in the last 168 hours. BNP (last 3 results) No results for input(s): PROBNP in the last 8760 hours. HbA1C: No results for input(s): HGBA1C in the last 72 hours. CBG: Recent Labs  Lab 07/14/19 0604  GLUCAP 85   Lipid Profile: No results for input(s): CHOL, HDL,  LDLCALC, TRIG, CHOLHDL, LDLDIRECT in the last 72 hours. Thyroid Function Tests: No results for input(s): TSH, T4TOTAL, FREET4, T3FREE, THYROIDAB in the last 72 hours. Anemia Panel: No results for input(s): VITAMINB12, FOLATE, FERRITIN, TIBC, IRON, RETICCTPCT in the last 72 hours. Urine analysis:    Component Value Date/Time   COLORURINE YELLOW 04/30/2018 1414   APPEARANCEUR CLEAR 04/30/2018 1414   LABSPEC 1.010 04/30/2018 1414   PHURINE 9.0 (H) 04/30/2018 1414   GLUCOSEU NEGATIVE 04/30/2018 1414   HGBUR NEGATIVE 04/30/2018 1414   BILIRUBINUR NEGATIVE 04/30/2018 1414   KETONESUR NEGATIVE 04/30/2018 1414   PROTEINUR NEGATIVE 04/30/2018 1414   UROBILINOGEN 0.2 07/31/2015 0118   NITRITE NEGATIVE 04/30/2018 1414   LEUKOCYTESUR NEGATIVE 04/30/2018 1414   Sepsis Labs: (procalcitonin:4,lacticacidven:4)  ) Recent Results (from the past 240 hour(s))  SARS CORONAVIRUS 2 (TAT 6-24 HRS) Nasopharyngeal Nasopharyngeal Swab  Status: None   Collection Time: 07/12/19 11:50 PM   Specimen: Nasopharyngeal Swab  Result Value Ref Range Status   SARS Coronavirus 2 NEGATIVE NEGATIVE Final    Comment: (NOTE) SARS-CoV-2 target nucleic acids are NOT DETECTED. The SARS-CoV-2 RNA is generally detectable in upper and lower respiratory specimens during the acute phase of infection. Negative results do not preclude SARS-CoV-2 infection, do not rule out co-infections with other pathogens, and should not be used as the sole basis for treatment or other patient management decisions. Negative results must be combined with clinical observations, patient history, and epidemiological information. The expected result is Negative. Fact Sheet for Patients: SugarRoll.be Fact Sheet for Healthcare Providers: https://www.woods-mathews.com/ This test is not yet approved or cleared by the [place] FDA and  has been authorized for detection and/or diagnosis of  SARS-CoV-2 by FDA under an Emergency Use Authorization (EUA). This EUA will remain  in effect (meaning this test can be used) for the duration of the COVID-19 declaration under Section 56 4(b)(1) of the Act, 21 U.S.C. section 360bbb-3(b)(1), unless the authorization is terminated or revoked sooner. Performed at [facility], [place] [address] [place], [place] [address]   MRSA PCR Screening     Status: None   Collection Time: 07/13/19 12:32 PM   Specimen: Nasal Mucosa; Nasopharyngeal  Result Value Ref Range Status   MRSA by PCR NEGATIVE NEGATIVE Final    Comment:        The GeneXpert MRSA Assay (FDA approved for NASAL specimens only), is one component of a comprehensive MRSA colonization surveillance program. It is not intended to diagnose MRSA infection nor to guide or monitor treatment for MRSA infections. Performed at [facility], [place] [address], [place], [place] [address]       Radiology Studies: Ct Head Wo Contrast  Result Date: 07/13/2019 CLINICAL DATA:  The patient suffered a fall out of a wheelchair with a blow to the head and small right subarachnoid hemorrhage 07/12/2019. Initial encounter. EXAM: CT HEAD WITHOUT CONTRAST TECHNIQUE: Contiguous axial images were obtained from the base of the skull through the vertex without intravenous contrast. COMPARISON:  Head CT 07/12/2019 and 12/24/2018. FINDINGS: Brain: Small subarachnoid hemorrhage along the right Sylvian fissure is unchanged. No new hemorrhage is present. No midline shift, hydrocephalus or acute infarct. Atrophy, chronic microvascular ischemic change and remote right frontal infarct are unchanged. Vascular: No hyperdense vessel or unexpected calcification. Skull: No fracture. Thinning of the right frontal bone is unchanged. Sinuses/Orbits: Negative. Other: Scalp contusion on the right again seen. IMPRESSION: No change in a small right subarachnoid hemorrhage. Negative for hydrocephalus or other new  abnormality. Atrophy, chronic microvascular ischemic change and remote right frontal infarct. Electronically Signed   By: Inge Rise M.D.   On: 07/13/2019 13:31   Ct Head Wo Contrast  Result Date: 07/12/2019 CLINICAL DATA:  [place] out of his wheelchair. EXAM: CT HEAD WITHOUT CONTRAST CT CERVICAL SPINE WITHOUT CONTRAST TECHNIQUE: Multidetector CT imaging of the head and cervical spine was performed following the standard protocol without intravenous contrast. Multiplanar CT image reconstructions of the cervical spine were also generated. COMPARISON:  CT head and cervical spine dated May 03, 2019. FINDINGS: CT HEAD FINDINGS Brain: New 9 mm focus of subarachnoid hemorrhage in the right sylvian fissure. No evidence of acute infarction, hydrocephalus, extra-axial collection or mass lesion/mass effect. Chronic encephalomalacia right frontal lobe. Unchanged moderate to severe atrophy and mild chronic microvascular ischemic changes. Vascular: Calcified atherosclerosis at the skullbase. No  hyperdense vessel. Skull: Negative for fracture or focal lesion. Unchanged thinning of the right frontal bone. Sinuses/Orbits: No acute finding. Other: Small right frontal scalp hematoma and laceration. CT CERVICAL SPINE FINDINGS Alignment: Unchanged 2 mm anterolisthesis at C4-C5 and C5-C6. No traumatic malalignment. Skull base and vertebrae: Acute nondisplaced fracture through the base of the dens (series 14, image 32). No additional fracture. Soft tissues and spinal canal: No prevertebral fluid or swelling. No visible canal hematoma. Disc levels: Prior C6-C7 ankylosis. Similar appearing multilevel degenerative disc disease and diffuse moderate facet arthropathy. Upper chest: Negative. Other: None. IMPRESSION: 1. 9 mm focus of subarachnoid hemorrhage in the right sylvian fissure. 2. Acute nondisplaced type 2 dens fracture. 3. Small right frontal scalp hematoma and laceration. Critical Value/emergent results were called by  telephone at the time of interpretation on 07/12/2019 at 8:48 pm to providerBRIAN MILLER , who verbally acknowledged these results. Electronically Signed   By: Obie DredgeWilliam T Derry M.D.   On: 07/12/2019 20:52   Ct Cervical Spine Wo Contrast  Result Date: 07/12/2019 CLINICAL DATA:  Larey SeatFell out of his wheelchair. EXAM: CT HEAD WITHOUT CONTRAST CT CERVICAL SPINE WITHOUT CONTRAST TECHNIQUE: Multidetector CT imaging of the head and cervical spine was performed following the standard protocol without intravenous contrast. Multiplanar CT image reconstructions of the cervical spine were also generated. COMPARISON:  CT head and cervical spine dated May 03, 2019. FINDINGS: CT HEAD FINDINGS Brain: New 9 mm focus of subarachnoid hemorrhage in the right sylvian fissure. No evidence of acute infarction, hydrocephalus, extra-axial collection or mass lesion/mass effect. Chronic encephalomalacia right frontal lobe. Unchanged moderate to severe atrophy and mild chronic microvascular ischemic changes. Vascular: Calcified atherosclerosis at the skullbase. No hyperdense vessel. Skull: Negative for fracture or focal lesion. Unchanged thinning of the right frontal bone. Sinuses/Orbits: No acute finding. Other: Small right frontal scalp hematoma and laceration. CT CERVICAL SPINE FINDINGS Alignment: Unchanged 2 mm anterolisthesis at C4-C5 and C5-C6. No traumatic malalignment. Skull base and vertebrae: Acute nondisplaced fracture through the base of the dens (series 14, image 32). No additional fracture. Soft tissues and spinal canal: No prevertebral fluid or swelling. No visible canal hematoma. Disc levels: Prior C6-C7 ankylosis. Similar appearing multilevel degenerative disc disease and diffuse moderate facet arthropathy. Upper chest: Negative. Other: None. IMPRESSION: 1. 9 mm focus of subarachnoid hemorrhage in the right sylvian fissure. 2. Acute nondisplaced type 2 dens fracture. 3. Small right frontal scalp hematoma and laceration. Critical  Value/emergent results were called by telephone at the time of interpretation on 07/12/2019 at 8:48 pm to providerBRIAN MILLER , who verbally acknowledged these results. Electronically Signed   By: Obie DredgeWilliam T Derry M.D.   On: 07/12/2019 20:52    Scheduled Meds:  losartan  25 mg Oral Daily   sodium chloride flush  3 mL Intravenous Q12H   Continuous Infusions:  levETIRAcetam 500 mg (07/14/19 1016)     LOS: 1 day    Time spent: 35 minutes    Canary BrimBrandi Ollis, AG-ACNP-S [facility]   [facility] 07/14/2019, 11:52 AM     Please page through AMION:  [email or web address] Password [email or web address] If 7PM-7AM, please contact night-coverage       Attending MD Note:  I have seen and examined the patient with nurse practitioner/physician assistant and agree with the note above which has been edited to reflect our agreed upon history, exam, and assessment/plan.   I have personally reviewed the orders for the patient, which were made under my direction.    Barnett HatterBruce E Stfleur  is a 83 y.o. male with advanced end stage dementia on hospice.  He was admitted with [facility] and dens fracture, and is failing to thrive.  We will make arrangements for comfort cares and discharge to SNF with hospice wtihin 24 hours.    Romina Divirgilio P Heela Heishman

## 2019-07-14 NOTE — Progress Notes (Signed)
PT Cancellation Note  Patient Details Name: Jacob York MRN: [id number] DOB: 05-Dec-1935   Cancelled Treatment:    Reason Eval/Treat Not Completed: PT screened, no needs identified, will sign off. Per staff at SNF pt is dependent at baseline, total assist for bed mobility, transfers, and wheelchair mobility. Pt has no current skilled PT needs. Pt planning to discharge back to SNF on hospice. PT signing off at this time.   Zenaida Niece 07/14/2019, 1:48 PM

## 2019-07-15 LAB — GLUCOSE, CAPILLARY: Glucose-Capillary: 93 mg/dL (ref 70–99)

## 2019-07-15 MED ORDER — ACETAMINOPHEN 650 MG RE SUPP: 650.0000 mg | Freq: Four times a day (QID) | RECTAL | 0 refills | Status: AC | PRN

## 2019-07-15 MED ORDER — LORAZEPAM 0.5 MG PO TABS: 0.5000 mg | ORAL_TABLET | Freq: Every day | ORAL | 0 refills | Status: AC

## 2019-07-15 MED ORDER — LEVETIRACETAM 100 MG/ML PO SOLN: 500.0000 mg | Freq: Two times a day (BID) | ORAL | 12 refills | Status: AC

## 2019-07-15 MED ORDER — MORPHINE SULFATE 20 MG/5ML PO SOLN: 5.0000 mg | ORAL | 0 refills | Status: AC | PRN

## 2019-07-15 NOTE — TOC Transition Note (Signed)
Transition of Care [facility]) - CM/SW Discharge Note   Patient Details  Name: Jacob York MRN: [id number] Date of Birth: September 10, 1936  Transition of Care [facility]) CM/SW Contact:  Geralynn Ochs, LCSW Phone Number: 07/15/2019, 10:14 AM   Clinical Narrative:   Nurse to call report to [phone].  Transport set for 12:00 PM.    Final next level of care: [place] Barriers to Discharge: Barriers Resolved   Patient Goals and CMS Choice Patient states their goals for this hospitalization and ongoing recovery are:: son's goal is to keep patient comfortable and get him closer to where he is in [employer].gov Compare Post Acute Care list provided to:: Patient Represenative (must comment) Choice offered to / list presented to : Adult Children  Discharge Placement                Patient to be transferred to facility by: [place] Name of family member notified: Son Elenore Rota Patient and family notified of of transfer: 07/15/19  Discharge Plan and Services     Post Acute Care Choice: Hospice                               Social Determinants of Health (SDOH) Interventions     Readmission Risk Interventions No flowsheet data found.

## 2019-08-08 DEATH — deceased
